# Patient Record
Sex: Female | Born: 1965 | Race: White | Hispanic: No | Marital: Married | Smoking: Current every day smoker
Health system: Southern US, Community
[De-identification: ages and names within clinical notes are randomized; demographics above are authoritative.]

## PROBLEM LIST (undated history)

## (undated) DIAGNOSIS — E663 Overweight: Secondary | ICD-10-CM

## (undated) DIAGNOSIS — N959 Unspecified menopausal and perimenopausal disorder: Secondary | ICD-10-CM

## (undated) DIAGNOSIS — H9192 Unspecified hearing loss, left ear: Secondary | ICD-10-CM

## (undated) DIAGNOSIS — F17201 Nicotine dependence, unspecified, in remission: Secondary | ICD-10-CM

## (undated) DIAGNOSIS — K5730 Diverticulosis of large intestine without perforation or abscess without bleeding: Secondary | ICD-10-CM

## (undated) DIAGNOSIS — F17200 Nicotine dependence, unspecified, uncomplicated: Secondary | ICD-10-CM

## (undated) DIAGNOSIS — I10 Essential (primary) hypertension: Secondary | ICD-10-CM

## (undated) DIAGNOSIS — N921 Excessive and frequent menstruation with irregular cycle: Secondary | ICD-10-CM

## (undated) DIAGNOSIS — I700 Atherosclerosis of aorta: Secondary | ICD-10-CM

## (undated) DIAGNOSIS — F329 Major depressive disorder, single episode, unspecified: Secondary | ICD-10-CM

## (undated) DIAGNOSIS — J42 Unspecified chronic bronchitis: Secondary | ICD-10-CM

## (undated) DIAGNOSIS — F419 Anxiety disorder, unspecified: Secondary | ICD-10-CM

## (undated) HISTORY — DX: Overweight: E66.3

## (undated) HISTORY — DX: Diverticulosis of large intestine without perforation or abscess without bleeding: K57.30

## (undated) HISTORY — DX: Atherosclerosis of aorta: I70.0

## (undated) HISTORY — DX: Unspecified chronic bronchitis: J42

## (undated) HISTORY — DX: Essential (primary) hypertension: I10

## (undated) HISTORY — DX: Excessive and frequent menstruation with irregular cycle: N92.1

## (undated) HISTORY — PX: OTHER SURGICAL HISTORY: SHX169

## (undated) HISTORY — DX: Nicotine dependence, unspecified, in remission: F17.201

## (undated) HISTORY — DX: Nicotine dependence, unspecified, uncomplicated: F17.200

## (undated) HISTORY — DX: Anxiety disorder, unspecified: F41.9

## (undated) HISTORY — DX: Unspecified hearing loss, left ear: H91.92

## (undated) HISTORY — DX: Unspecified menopausal and perimenopausal disorder: N95.9

## (undated) HISTORY — DX: Major depressive disorder, single episode, unspecified: F32.9

---

## 1999-04-17 ENCOUNTER — Other Ambulatory Visit: Admission: RE | Admit: 1999-04-17 | Discharge: 1999-04-17 | Payer: Self-pay | Admitting: Family Medicine

## 2002-06-09 ENCOUNTER — Other Ambulatory Visit: Admission: RE | Admit: 2002-06-09 | Discharge: 2002-06-09 | Payer: Self-pay | Admitting: Family Medicine

## 2005-01-30 ENCOUNTER — Ambulatory Visit: Payer: Self-pay | Admitting: Internal Medicine

## 2005-02-13 ENCOUNTER — Ambulatory Visit: Payer: Self-pay | Admitting: Internal Medicine

## 2005-05-07 ENCOUNTER — Ambulatory Visit: Payer: Self-pay | Admitting: Internal Medicine

## 2007-02-15 ENCOUNTER — Telehealth (INDEPENDENT_AMBULATORY_CARE_PROVIDER_SITE_OTHER): Payer: Self-pay | Admitting: *Deleted

## 2009-04-10 ENCOUNTER — Encounter: Admission: RE | Admit: 2009-04-10 | Discharge: 2009-04-10 | Payer: Self-pay | Admitting: Obstetrics and Gynecology

## 2011-01-21 DIAGNOSIS — N959 Unspecified menopausal and perimenopausal disorder: Secondary | ICD-10-CM

## 2011-01-21 HISTORY — DX: Unspecified menopausal and perimenopausal disorder: N95.9

## 2011-07-14 ENCOUNTER — Ambulatory Visit (INDEPENDENT_AMBULATORY_CARE_PROVIDER_SITE_OTHER): Payer: BC Managed Care – PPO | Admitting: Family Medicine

## 2011-07-14 VITALS — BP 156/99 | HR 80 | Temp 98.4°F | Resp 18 | Ht 64.0 in | Wt 166.2 lb

## 2011-07-14 DIAGNOSIS — R350 Frequency of micturition: Secondary | ICD-10-CM

## 2011-07-14 LAB — POCT URINALYSIS DIPSTICK
Bilirubin, UA: NEGATIVE
Glucose, UA: NEGATIVE
Spec Grav, UA: <=1.005
pH, UA: 6

## 2011-07-14 LAB — POCT UA - MICROSCOPIC ONLY
Bacteria, U Microscopic: NEGATIVE
Casts, Ur, LPF, POC: NEGATIVE
Crystals, Ur, HPF, POC: NEGATIVE
Mucus, UA: NEGATIVE

## 2011-07-14 MED ORDER — CIPROFLOXACIN HCL 500 MG PO TABS: 500.0000 mg | ORAL_TABLET | Freq: Two times a day (BID) | ORAL | Status: AC

## 2011-07-14 NOTE — Progress Notes (Signed)
   46 yo woman with urgency and terminal voiding bladder discomfort.  No back pain, fever.  LMP last week.  She [facility] 3 days of [facility]  These are first UTI's in years.  O:  NAD Results for orders placed in visit on 07/14/11  POCT URINALYSIS DIPSTICK      Component Value Range   Color, UA yellow     Clarity, UA clear     Glucose, UA negative     Bilirubin, UA negative     Ketones, UA negative     Spec Grav, UA <=1.005     Blood, UA trace-lysed     pH, UA 6.0     Protein, UA negative     Urobilinogen, UA 0.2     Nitrite, UA negative     Leukocytes, UA Negative    POCT UA - MICROSCOPIC ONLY      Component Value Range   WBC, Ur, HPF, POC 0-1     RBC, urine, microscopic negative     Bacteria, U Microscopic negative     Mucus, UA negative     Epithelial cells, urine per micros 0-1     Crystals, Ur, HPF, POC negative     Casts, Ur, LPF, POC negative     Yeast, UA negative        A:  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 Since patient is leaving town on fishing trip elected to restart the Cipro and wait for the urine culture come back.  P:  Urine culture  Cipro 500 mg bid x 7 days

## 2011-07-16 LAB — URINE CULTURE
Colony Count: NO GROWTH
Organism ID, Bacteria: NO GROWTH

## 2011-11-11 ENCOUNTER — Encounter: Payer: Self-pay | Admitting: Family Medicine

## 2011-11-11 ENCOUNTER — Ambulatory Visit (INDEPENDENT_AMBULATORY_CARE_PROVIDER_SITE_OTHER): Payer: BC Managed Care – PPO | Admitting: Family Medicine

## 2011-11-11 VITALS — BP 161/95 | HR 84 | Temp 98.0°F | Ht 64.0 in | Wt 169.8 lb

## 2011-11-11 DIAGNOSIS — Z720 Tobacco use: Secondary | ICD-10-CM | POA: Insufficient documentation

## 2011-11-11 DIAGNOSIS — N926 Irregular menstruation, unspecified: Secondary | ICD-10-CM

## 2011-11-11 DIAGNOSIS — F32A Anxiety disorder, unspecified: Secondary | ICD-10-CM

## 2011-11-11 DIAGNOSIS — F329 Major depressive disorder, single episode, unspecified: Secondary | ICD-10-CM | POA: Insufficient documentation

## 2011-11-11 DIAGNOSIS — F17200 Nicotine dependence, unspecified, uncomplicated: Secondary | ICD-10-CM

## 2011-11-11 DIAGNOSIS — Z0000 Encounter for general adult medical examination without abnormal findings: Secondary | ICD-10-CM | POA: Insufficient documentation

## 2011-11-11 DIAGNOSIS — F341 Dysthymic disorder: Secondary | ICD-10-CM

## 2011-11-11 DIAGNOSIS — F419 Anxiety disorder, unspecified: Secondary | ICD-10-CM | POA: Insufficient documentation

## 2011-11-11 DIAGNOSIS — E663 Overweight: Secondary | ICD-10-CM | POA: Insufficient documentation

## 2011-11-11 DIAGNOSIS — I10 Essential (primary) hypertension: Secondary | ICD-10-CM | POA: Insufficient documentation

## 2011-11-11 DIAGNOSIS — N921 Excessive and frequent menstruation with irregular cycle: Secondary | ICD-10-CM

## 2011-11-11 HISTORY — DX: Excessive and frequent menstruation with irregular cycle: N92.1

## 2011-11-11 HISTORY — DX: Overweight: E66.3

## 2011-11-11 HISTORY — DX: Anxiety disorder, unspecified: F41.9

## 2011-11-11 HISTORY — DX: Anxiety disorder, unspecified: F32.A

## 2011-11-11 MED ORDER — BUPROPION HCL ER (XL) 150 MG PO TB24: 150.0000 mg | ORAL_TABLET | Freq: Every day | ORAL | Status: DC

## 2011-11-11 MED ORDER — LISINOPRIL 10 MG PO TABS: 10.0000 mg | ORAL_TABLET | Freq: Two times a day (BID) | ORAL | Status: DC

## 2011-11-11 NOTE — Assessment & Plan Note (Signed)
Just started on Wellbutrin XL 150 mg po daily 3 weeks ago and definitely feels it is helping. Given a refill today and she may have trouble with insurance in which case we can switch her to bupropion 75 mg short acting 1 tab twice daily

## 2011-11-11 NOTE — Patient Instructions (Addendum)
Consider Digestive Health probiotics by Schiff, couponse on website  Care for Adults, Female A healthy lifestyle and preventive care can promote health and wellness. Preventive health guidelines for women include the following key practices.  A routine yearly physical is a good way to check with your caregiver about your health and preventive screening. It is a chance to share any concerns and updates on your health, and to receive a thorough exam.  Visit your dentist for a routine exam and preventive care every 6 months. Brush your teeth twice a day and floss once a day. Good oral hygiene prevents tooth decay and gum disease.  The frequency of eye exams is based on your age, health, family medical history, use of contact lenses, and other factors. Follow your caregiver's recommendations for frequency of eye exams.  Eat a healthy diet. Foods like vegetables, fruits, whole grains, low-fat dairy products, and lean protein foods contain the nutrients you need without too many calories. Decrease your intake of foods high in solid fats, added sugars, and salt. Eat the right amount of calories for you.Get information about a proper diet from your caregiver, if necessary.  Regular physical exercise is one of the most important things you can do for your health. Most adults should get at least 150 minutes of moderate-intensity exercise (any activity that increases your heart rate and causes you to sweat) each week. In addition, most adults need muscle-strengthening exercises on 2 or more days a week.  Maintain a healthy weight. The body mass index (BMI) is a screening tool to identify possible weight problems. It provides an estimate of body fat based on height and weight. Your caregiver can help determine your BMI, and can help you achieve or maintain a healthy weight.For adults 20 years and older:  A BMI below 18.5 is considered underweight.  A BMI of 18.5 to 24.9 is normal.  A BMI of 25 to 29.9 is  considered overweight.  A BMI of 30 and above is considered obese.  Maintain normal blood lipids and cholesterol levels by exercising and minimizing your intake of saturated fat. Eat a balanced diet with plenty of fruit and vegetables. Blood tests for lipids and cholesterol should begin at age 96 and be repeated every 5 years. If your lipid or cholesterol levels are high, you are over 50, or you are at high risk for heart disease, you may need your cholesterol levels checked more frequently.Ongoing high lipid and cholesterol levels should be treated with medicines if diet and exercise are not effective.  If you smoke, find out from your caregiver how to quit. If you do not use tobacco, do not start.  If you are pregnant, do not drink alcohol. If you are breastfeeding, be very cautious about drinking alcohol. If you are not pregnant and choose to drink alcohol, do not exceed 1 drink per day. One drink is considered to be 12 ounces (355 mL) of beer, 5 ounces (148 mL) of wine, or 1.5 ounces (44 mL) of liquor.  Avoid use of street drugs. Do not share needles with anyone. Ask for help if you need support or instructions about stopping the use of drugs.  High blood pressure causes heart disease and increases the risk of stroke. Your blood pressure should be checked at least every 1 to 2 years. Ongoing high blood pressure should be treated with medicines if weight loss and exercise are not effective.  If you are 27 to 47 years old, ask your caregiver if  you should take aspirin to prevent strokes.  Diabetes screening involves taking a blood sample to check your fasting blood sugar level. This should be done once every 3 years, after age 44, if you are within normal weight and without risk factors for diabetes. Testing should be considered at a younger age or be carried out more frequently if you are overweight and have at least 1 risk factor for diabetes.  Breast cancer screening is essential preventive  care for women. You should practice "breast self-awareness." This means understanding the normal appearance and feel of your breasts and may include breast self-examination. Any changes detected, no matter how small, should be reported to a caregiver. Women in their 23s and 30s should have a clinical breast exam (CBE) by a caregiver as part of a regular health exam every 1 to 3 years. After age 66, women should have a CBE every year. Starting at age 26, women should consider having a mammography (breast X-ray test) every year. Women who have a family history of breast cancer should talk to their caregiver about genetic screening. Women at a high risk of breast cancer should talk to their caregivers about having magnetic resonance imaging (MRI) and a mammography every year.  The Pap test is a screening test for cervical cancer. A Pap test can show cell changes on the cervix that might become cervical cancer if left untreated. A Pap test is a procedure in which cells are obtained and examined from the lower end of the uterus (cervix).  Women should have a Pap test starting at age 19.  Between ages 24 and 67, Pap tests should be repeated every 2 years.  Beginning at age 46, you should have a Pap test every 3 years as long as the past 3 Pap tests have been normal.  Some women have medical problems that increase the chance of getting cervical cancer. Talk to your caregiver about these problems. It is especially important to talk to your caregiver if a new problem develops soon after your last Pap test. In these cases, your caregiver may recommend more frequent screening and Pap tests.  The above recommendations are the same for women who have or have not gotten the vaccine for human papillomavirus (HPV).  If you had a hysterectomy for a problem that was not cancer or a condition that could lead to cancer, then you no longer need Pap tests. Even if you no longer need a Pap test, a regular exam is a good idea  to make sure no other problems are starting.  If you are between ages 26 and 29, and you have had normal Pap tests going back 10 years, you no longer need Pap tests. Even if you no longer need a Pap test, a regular exam is a good idea to make sure no other problems are starting.  If you have had past treatment for cervical cancer or a condition that could lead to cancer, you need Pap tests and screening for cancer for at least 20 years after your treatment.  If Pap tests have been discontinued, risk factors (such as a new sexual partner) need to be reassessed to determine if screening should be resumed.  The HPV test is an additional test that may be used for cervical cancer screening. The HPV test looks for the virus that can cause the cell changes on the cervix. The cells collected during the Pap test can be tested for HPV. The HPV test could be used to  screen women aged 73 years and older, and should be used in women of any age who have unclear Pap test results. After the age of 29, women should have HPV testing at the same frequency as a Pap test.  Colorectal cancer can be detected and often prevented. Most routine colorectal cancer screening begins at the age of 64 and continues through age 2. However, your caregiver may recommend screening at an earlier age if you have risk factors for colon cancer. On a yearly basis, your caregiver may provide home test kits to check for hidden blood in the stool. Use of a small camera at the end of a tube, to directly examine the colon (sigmoidoscopy or colonoscopy), can detect the earliest forms of colorectal cancer. Talk to your caregiver about this at age 76, when routine screening begins. Direct examination of the colon should be repeated every 5 to 10 years through age 26, unless early forms of pre-cancerous polyps or small growths are found.  Hepatitis C blood testing is recommended for all people born from [birth year] through 1965 and any individual with known  risks for hepatitis C.  Practice safe sex. Use condoms and avoid high-risk sexual practices to reduce the spread of sexually transmitted infections (STIs). STIs include gonorrhea, chlamydia, syphilis, trichomonas, herpes, HPV, and human immunodeficiency virus (HIV). Herpes, HIV, and HPV are viral illnesses that have no cure. They can result in disability, cancer, and death. Sexually active women aged 31 and younger should be checked for chlamydia. Older women with new or multiple partners should also be tested for chlamydia. Testing for other STIs is recommended if you are sexually active and at increased risk.  Osteoporosis is a disease in which the bones lose minerals and strength with aging. This can result in serious bone fractures. The risk of osteoporosis can be identified using a bone density scan. Women ages 60 and over and women at risk for fractures or osteoporosis should discuss screening with their caregivers. Ask your caregiver whether you should take a calcium supplement or vitamin D to reduce the rate of osteoporosis.  Menopause can be associated with physical symptoms and risks. Hormone replacement therapy is available to decrease symptoms and risks. You should talk to your caregiver about whether hormone replacement therapy is right for you.  Use sunscreen with sun protection factor (SPF) of 30 or more. Apply sunscreen liberally and repeatedly throughout the day. You should seek shade when your shadow is shorter than you. Protect yourself by wearing long sleeves, pants, a wide-brimmed hat, and sunglasses year round, whenever you are outdoors.  Once a month, do a whole body skin exam, using a mirror to look at the skin on your back. Notify your caregiver of new moles, moles that have irregular borders, moles that are larger than a pencil eraser, or moles that have changed in shape or color.  Stay current with required immunizations.  Influenza. You need a dose every fall (or winter).  The composition of the flu vaccine changes each year, so being vaccinated once is not enough.  Pneumococcal polysaccharide. You need 1 to 2 doses if you smoke cigarettes or if you have certain chronic medical conditions. You need 1 dose at age 68 (or older) if you have never been vaccinated.  Tetanus, diphtheria, pertussis (Tdap, Td). Get 1 dose of Tdap vaccine if you are younger than age 70, are over 64 and have contact with an infant, are a Research scientist (physical sciences), are pregnant, or simply want to be protected  from whooping cough. After that, you need a Td booster dose every 10 years. Consult your caregiver if you have not had at least 3 tetanus and diphtheria-containing shots sometime in your life or have a deep or dirty wound.  HPV. You need this vaccine if you are a woman age 92 or younger. The vaccine is given in 3 doses over 6 months.  Measles, mumps, rubella (MMR). You need at least 1 dose of MMR if you were born in 1957 or later. You may also need a second dose.  Meningococcal. If you are age 1 to 54 and a first-year college student living in a residence hall, or have one of several medical conditions, you need to get vaccinated against meningococcal disease. You may also need additional booster doses.  Zoster (shingles). If you are age 88 or older, you should get this vaccine.  Varicella (chickenpox). If you have never had chickenpox or you were vaccinated but received only 1 dose, talk to your caregiver to find out if you need this vaccine.  Hepatitis A. You need this vaccine if you have a specific risk factor for hepatitis A virus infection or you simply wish to be protected from this disease. The vaccine is usually given as 2 doses, 6 to 18 months apart.  Hepatitis B. You need this vaccine if you have a specific risk factor for hepatitis B virus infection or you simply wish to be protected from this disease. The vaccine is given in 3 doses, usually over 6 months. Preventive Services /  Frequency Ages 25 to 1  Blood pressure check.** / Every 1 to 2 years.  Lipid and cholesterol check.** / Every 5 years beginning at age 67.  Clinical breast exam.** / Every 3 years for women in their 24s and 30s.  Pap test.** / Every 2 years from ages 79 through 27. Every 3 years starting at age 3 through age 58 or 49 with a history of 3 consecutive normal Pap tests.  HPV screening.** / Every 3 years from ages 63 through ages 50 to 59 with a history of 3 consecutive normal Pap tests.  Hepatitis C blood test.** / For any individual with known risks for hepatitis C.  Skin self-exam. / Monthly.  Influenza immunization.** / Every year.  Pneumococcal polysaccharide immunization.** / 1 to 2 doses if you smoke cigarettes or if you have certain chronic medical conditions.  Tetanus, diphtheria, pertussis (Tdap, Td) immunization. / A one-time dose of Tdap vaccine. After that, you need a Td booster dose every 10 years.  HPV immunization. / 3 doses over 6 months, if you are 100 and younger.  Measles, mumps, rubella (MMR) immunization. / You need at least 1 dose of MMR if you were born in 1957 or later. You may also need a second dose.  Meningococcal immunization. / 1 dose if you are age 49 to 48 and a first-year college student living in a residence hall, or have one of several medical conditions, you need to get vaccinated against meningococcal disease. You may also need additional booster doses.  Varicella immunization.** / Consult your caregiver.  Hepatitis A immunization.** / Consult your caregiver. 2 doses, 6 to 18 months apart.  Hepatitis B immunization.** / Consult your caregiver. 3 doses usually over 6 months. Ages 48 to 11  Blood pressure check.** / Every 1 to 2 years.  Lipid and cholesterol check.** / Every 5 years beginning at age 90.  Clinical breast exam.** / Every year after age 60.  Mammogram.** / Every year beginning at age 56 and continuing for as long as you are in  good health. Consult with your caregiver.  Pap test.** / Every 3 years starting at age 81 through age 71 or 8 with a history of 3 consecutive normal Pap tests.  HPV screening.** / Every 3 years from ages 70 through ages 17 to 80 with a history of 3 consecutive normal Pap tests.  Fecal occult blood test (FOBT) of stool. / Every year beginning at age 80 and continuing until age 41. You may not need to do this test if you get a colonoscopy every 10 years.  Flexible sigmoidoscopy or colonoscopy.** / Every 5 years for a flexible sigmoidoscopy or every 10 years for a colonoscopy beginning at age 54 and continuing until age 48.  Hepatitis C blood test.** / For all people born from [birth year] through 1966 and any individual with known risks for hepatitis C.  Skin self-exam. / Monthly.  Influenza immunization.** / Every year.  Pneumococcal polysaccharide immunization.** / 1 to 2 doses if you smoke cigarettes or if you have certain chronic medical conditions.  Tetanus, diphtheria, pertussis (Tdap, Td) immunization.** / A one-time dose of Tdap vaccine. After that, you need a Td booster dose every 10 years.  Measles, mumps, rubella (MMR) immunization. / You need at least 1 dose of MMR if you were born in 1957 or later. You may also need a second dose.  Varicella immunization.** / Consult your caregiver.  Meningococcal immunization.** / Consult your caregiver.  Hepatitis A immunization.** / Consult your caregiver. 2 doses, 6 to 18 months apart.  Hepatitis B immunization.** / Consult your caregiver. 3 doses, usually over 6 months. Ages 2 and over  Blood pressure check.** / Every 1 to 2 years.  Lipid and cholesterol check.** / Every 5 years beginning at age 97.  Clinical breast exam.** / Every year after age 3.  Mammogram.** / Every year beginning at age 22 and continuing for as long as you are in good health. Consult with your caregiver.  Pap test.** / Every 3 years starting at age 45 through  age 30 or 47 with a 3 consecutive normal Pap tests. Testing can be stopped between 65 and 70 with 3 consecutive normal Pap tests and no abnormal Pap or HPV tests in the past 10 years.  HPV screening.** / Every 3 years from ages 44 through ages 40 or 79 with a history of 3 consecutive normal Pap tests. Testing can be stopped between 65 and 70 with 3 consecutive normal Pap tests and no abnormal Pap or HPV tests in the past 10 years.  Fecal occult blood test (FOBT) of stool. / Every year beginning at age 4 and continuing until age 41. You may not need to do this test if you get a colonoscopy every 10 years.  Flexible sigmoidoscopy or colonoscopy.** / Every 5 years for a flexible sigmoidoscopy or every 10 years for a colonoscopy beginning at age 49 and continuing until age 26.  Hepatitis C blood test.** / For all people born from [birth year] through 1966 and any individual with known risks for hepatitis C.  Osteoporosis screening.** / A one-time screening for women ages 84 and over and women at risk for fractures or osteoporosis.  Skin self-exam. / Monthly.  Influenza immunization.** / Every year.  Pneumococcal polysaccharide immunization.** / 1 dose at age 75 (or older) if you have never been vaccinated.  Tetanus, diphtheria, pertussis (Tdap, Td) immunization. / A one-time  dose of Tdap vaccine if you are over 65 and have contact with an infant, are a Research scientist (physical sciences), or simply want to be protected from whooping cough. After that, you need a Td booster dose every 10 years.  Varicella immunization.** / Consult your caregiver.  Meningococcal immunization.** / Consult your caregiver.  Hepatitis A immunization.** / Consult your caregiver. 2 doses, 6 to 18 months apart.  Hepatitis B immunization.** / Check with your caregiver. 3 doses, usually over 6 months. ** Family history and personal history of risk and conditions may change your caregiver's recommendations. Document Released: 03/04/2001 Document  Revised: 03/31/2011 Document Reviewed: 06/03/2010 [facility] Patient Information 2013 [place], [place].

## 2011-11-11 NOTE — Assessment & Plan Note (Signed)
Just started on lisinopril 10 mg daily a couple weeks ago. Will increase her to 10 mg twice a day for now recheck her blood pressure in 1 month's time.

## 2011-11-11 NOTE — Assessment & Plan Note (Signed)
TSH, CBC, Lipid panels all reviewed and normal. Encouraged avoidance of trans fats, increased exercise, decreased po intake, minimal simple carbs and saturated fats.

## 2011-11-11 NOTE — Assessment & Plan Note (Addendum)
Patient encouraged to attempt complete cessation. She may try an ecig if she needs to

## 2011-11-11 NOTE — Assessment & Plan Note (Signed)
Patient has noted in last 6 months. Not very irregular. She would have very heavy period every 2 weeks for 8 weeks and then went 8 weeks without a cycle. She has discussed this with her GYN office and encouraged her to consider an ultrasound before she is declined. She's encouraged to proceed with this secondary to her anxiety about the situation

## 2011-11-11 NOTE — Assessment & Plan Note (Signed)
TSH, CBC and Lipid done recently all reviewed with patient and normal, check renal and hepatic today. Return in 3 months after old records arrive or as needed

## 2011-11-11 NOTE — Progress Notes (Signed)
Patient ID: Kristin Day, female   DOB: 08-25-1965, 46 y.o.   MRN: [id number] Kristin Day [id number] 30-Jul-1965 11/11/2011      Progress Note New Patient  Subjective  Chief Complaint  Chief Complaint  Patient presents with  . Establish Care    new patient    HPI  Patient is a 46 year old Caucasian female who is here today to establish care. She previously has been seen by GYN but her blood pressures been trending up for the past year. She reports typically see numbers in the 140s over 90s but more recently his see numbers in the 160s over 90s. She was encouraged to by her gynecologist's office to establish care. Started on lisinopril 10 mg daily which she has tolerated. 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 She was started on Wellbutrin 150 mg XL daily 3 responding to helping. Unfortunately she continues to smoke about half pack per day. She did have a UTI about a month ago but that is not a recurrent problem for her. She feels better at this time. Denies fevers, chills, congestion, chest pain, palpitations, shortness of breath, GI or GU complaints at today's visit.  Past Medical History  Diagnosis Date  . Chicken pox   . Hypertension   . Anxiety   . Overweight 11/11/2011  . Preventative health care 11/11/2011  . Anxiety and depression 11/11/2011    Past Surgical History  Procedure Date  . Cesarean section 1991    Family History  Problem Relation Age of Onset  . COPD Mother     smoker  . Emphysema Mother   . Hypertension Father   . Cancer Father     skin/BCC and prostate  . Cancer Maternal Grandmother     ovarian  . Emphysema Maternal Grandfather     ?  Marland Kitchen Heart disease Paternal Grandmother   . Heart disease Paternal Grandfather   . Hypertension Paternal Grandfather   . Diabetes Paternal Grandfather     History   Social History  . Marital Status: Married    Spouse Name:  N/A    Number of Children: N/A  . Years of Education: N/A   Occupational History  . Not on file.   Social History Main Topics  . Smoking status: Current Every Day Smoker -- 0.5 packs/day for 30 years    Types: Cigarettes  . Smokeless tobacco: Never Used  . Alcohol Use: Yes     6-12 beers weekly  . Drug Use: No  . Sexually Active: Yes -- Female partner(s)   Other Topics Concern  . Not on file   Social History Narrative  . No narrative on file    Current Outpatient Prescriptions on File Prior to Visit  Medication Sig Dispense Refill  . buPROPion (WELLBUTRIN XL) 150 MG 24 hr tablet Take 1 tablet (150 mg total) by mouth daily.  30 tablet  3  . lisinopril (PRINIVIL,ZESTRIL) 10 MG tablet Take 1 tablet (10 mg total) by mouth 2 (two) times daily.  60 tablet  2    No Known Allergies  Review of Systems  Review of Systems  Constitutional: Negative for fever, chills and malaise/fatigue.  HENT: Negative for hearing loss, nosebleeds and congestion.   Eyes: Negative for discharge.  Respiratory: Negative for cough, sputum production, shortness of breath and wheezing.   Cardiovascular: Negative for chest pain, palpitations and leg swelling.  Gastrointestinal: Negative for  heartburn, nausea, vomiting, abdominal pain, diarrhea, constipation and blood in stool.  Genitourinary: Negative for dysuria, urgency, frequency and hematuria.  Musculoskeletal: Negative for myalgias, back pain and falls.  Skin: Negative for rash.  Neurological: Negative for dizziness, tremors, sensory change, focal weakness, loss of consciousness, weakness and headaches.  Endo/Heme/Allergies: Negative for polydipsia. Does not bruise/bleed easily.  Psychiatric/Behavioral: Negative for depression and suicidal ideas. The patient is nervous/anxious. The patient does not have insomnia.     Objective  BP 161/95  Pulse 84  Temp 98 F (36.7 C) (Temporal)  Ht 5\' 4"  (1.626 m)  Wt 169 lb 12.8 oz (77.021 kg)  BMI 29.15  kg/m2  SpO2 96%  LMP 11/07/2011  Physical Exam  Physical Exam  Constitutional: She is oriented to person, place, and time and well-developed, well-nourished, and in no distress. No distress.  HENT:  Head: Normocephalic and atraumatic.  Right Ear: External ear normal.  Left Ear: External ear normal.  Nose: Nose normal.  Mouth/Throat: Oropharynx is clear and moist. No oropharyngeal exudate.  Eyes: Conjunctivae normal are normal. Pupils are equal, round, and reactive to light. Right eye exhibits no discharge. Left eye exhibits no discharge. No scleral icterus.  Neck: Normal range of motion. Neck supple. No thyromegaly present.  Cardiovascular: Normal rate, regular rhythm, normal heart sounds and intact distal pulses.   No murmur heard. Pulmonary/Chest: Effort normal and breath sounds normal. No respiratory distress. She has no wheezes. She has no rales.  Abdominal: Soft. Bowel sounds are normal. She exhibits no distension and no mass. There is no tenderness.  Musculoskeletal: Normal range of motion. She exhibits no edema and no tenderness.  Lymphadenopathy:    She has no cervical adenopathy.  Neurological: She is alert and oriented to person, place, and time. She has normal reflexes. No cranial nerve deficit. Coordination normal.  Skin: Skin is warm and dry. No rash noted. She is not diaphoretic.  Psychiatric: Mood, memory and affect normal.       Assessment & Plan  Tobacco abuse Patient encouraged to attempt complete cessation. She may try an ecig if she needs to  Anxiety and depression Just started on Wellbutrin XL 150 mg po daily 3 weeks ago and definitely feels it is helping. Given a refill today and she may have trouble with insurance in which case we can switch her to bupropion 75 mg short acting 1 tab twice daily  Hypertension Just started on lisinopril 10 mg daily a couple weeks ago. Will increase her to 10 mg twice a day for now recheck her blood pressure in 1 month's  time.  Overweight TSH, CBC, Lipid panels all reviewed and normal. Encouraged avoidance of trans fats, increased exercise, decreased po intake, minimal simple carbs and saturated fats.   Preventative health care TSH, CBC and Lipid done recently all reviewed with patient and normal, check renal and hepatic today. Return in 3 months after old records arrive or as needed  Irregular menses Patient has noted in last 6 months. Not very irregular. She would have very heavy period every 2 weeks for 8 weeks and then went 8 weeks without a cycle. She has discussed this with her GYN office and encouraged her to consider an ultrasound before she is declined. She's encouraged to proceed with this secondary to her anxiety about the situation

## 2011-11-12 LAB — RENAL FUNCTION PANEL
Albumin: 3.6 g/dL (ref 3.5–5.2)
BUN: 10 mg/dL (ref 6–23)
CO2: 26 mEq/L (ref 19–32)
Chloride: 106 mEq/L (ref 96–112)

## 2011-11-12 LAB — HEPATIC FUNCTION PANEL
AST: 17 U/L (ref 0–37)
Total Protein: 6.9 g/dL (ref 6.0–8.3)

## 2011-12-12 ENCOUNTER — Encounter: Payer: Self-pay | Admitting: Family Medicine

## 2011-12-12 ENCOUNTER — Ambulatory Visit (INDEPENDENT_AMBULATORY_CARE_PROVIDER_SITE_OTHER): Payer: BC Managed Care – PPO | Admitting: Family Medicine

## 2011-12-12 VITALS — BP 171/112 | HR 71

## 2011-12-12 DIAGNOSIS — I10 Essential (primary) hypertension: Secondary | ICD-10-CM

## 2011-12-12 MED ORDER — LISINOPRIL 40 MG PO TABS: 40.0000 mg | ORAL_TABLET | Freq: Every day | ORAL | Status: DC

## 2011-12-12 NOTE — Progress Notes (Signed)
46 y/o WF here for nurse visit for bp check.   BP elevated in stage 2 range still while taking lisinopril 10mg  twice a day. HR in the 70s.  Pt appears well today. Discussed with pt. Decided on dose increase to 40mg  lisinopril once daily. Also encouraged pt to buy bp cuff for home bp monitoring. She agreed to changes and will keep scheduled f/u with Dr. Abner Greenspan.

## 2011-12-12 NOTE — Progress Notes (Signed)
Pt presents for blood pressure check.   Check at 2:20 pm was 171/112, P 71 automatic Pt states she had 2 cigarettes on the way to our office and left a manager's meeting that was not going well.  2nd check at 2:40 pm 152/98, P 86 Manual  Please advise any changes.

## 2011-12-12 NOTE — Assessment & Plan Note (Signed)
Poor control. Increase lisinopril to 40mg  qd. She has f/u set with Dr. Abner Greenspan soon.

## 2012-01-27 ENCOUNTER — Ambulatory Visit (INDEPENDENT_AMBULATORY_CARE_PROVIDER_SITE_OTHER): Payer: BC Managed Care – PPO | Admitting: Family Medicine

## 2012-01-27 ENCOUNTER — Encounter: Payer: Self-pay | Admitting: Family Medicine

## 2012-01-27 VITALS — BP 138/88 | HR 94 | Temp 98.4°F | Ht 64.0 in | Wt 167.0 lb

## 2012-01-27 DIAGNOSIS — F17200 Nicotine dependence, unspecified, uncomplicated: Secondary | ICD-10-CM

## 2012-01-27 DIAGNOSIS — F329 Major depressive disorder, single episode, unspecified: Secondary | ICD-10-CM

## 2012-01-27 DIAGNOSIS — I10 Essential (primary) hypertension: Secondary | ICD-10-CM

## 2012-01-27 DIAGNOSIS — J209 Acute bronchitis, unspecified: Secondary | ICD-10-CM | POA: Insufficient documentation

## 2012-01-27 DIAGNOSIS — Z720 Tobacco use: Secondary | ICD-10-CM

## 2012-01-27 DIAGNOSIS — J40 Bronchitis, not specified as acute or chronic: Secondary | ICD-10-CM

## 2012-01-27 DIAGNOSIS — F341 Dysthymic disorder: Secondary | ICD-10-CM

## 2012-01-27 DIAGNOSIS — F32A Depression, unspecified: Secondary | ICD-10-CM

## 2012-01-27 MED ORDER — AZITHROMYCIN 250 MG PO TABS: ORAL_TABLET | ORAL | Status: DC

## 2012-01-27 MED ORDER — HYDROCOD POLST-CHLORPHEN POLST 10-8 MG/5ML PO LQCR: 5.0000 mL | Freq: Every evening | ORAL | Status: DC | PRN

## 2012-01-27 MED ORDER — METHYLPREDNISOLONE 4 MG PO KIT: PACK | ORAL | Status: DC

## 2012-01-27 NOTE — Assessment & Plan Note (Signed)
Improved with increased Lisinopril despite acute illness. Continue same

## 2012-01-27 NOTE — Assessment & Plan Note (Signed)
Encouraged increased rest, hydration and mucinex bid, Started on a Zpak and Medrol dosepak

## 2012-01-27 NOTE — Assessment & Plan Note (Signed)
Has not smoked a cigarette for days secondary to acute illness, encouraged to try not to go back when she is feeling better.

## 2012-01-27 NOTE — Assessment & Plan Note (Signed)
Tolerating Wellbutrin XL 150 mg daily. Declines further increase will discuss at next visit

## 2012-01-27 NOTE — Patient Instructions (Addendum)

## 2012-01-27 NOTE — Progress Notes (Signed)
Patient ID: Kristin Day, female   DOB: 1965/07/01, 47 y.o.   MRN: [id number] Kristin Day [id number] 1965-07-08 01/27/2012      Progress Note-Follow Up  Subjective  Chief Complaint  Chief Complaint  Patient presents with  . Fever    X 3 days  . Headache    X 3 days  . Cough    dry X 3 days  . Fatigue    X 3 days    HPI  Patient is a 47 year old Caucasian female in she was roughly 3 days with worsening symptoms. Started with fevers and malaise. Over the last year she developed significant mostly dry cough which is now keeping her up at night. She's had a temperature off-and-on to a maximum of 2.9. Is now taking Advil every 4 hours secondary to some chest wall pain worsens when she coughs but also low-grade fevers. She denies ear pain, throat pain, sinus pressure. Denies shortness or breath or wheezing but her cough is almost debilitating. Mild nasal congestion but no rhinorrhea. Denies any GI or GU complaints. No diarrhea, nausea vomiting, urinary frequency urgency or dysuria are noted. No palpitations.  Past Medical History  Diagnosis Date  . Chicken pox   . Hypertension   . Anxiety   . Overweight 11/11/2011  . Preventative health care 11/11/2011  . Anxiety and depression 11/11/2011  . Irregular menses 11/11/2011  . Acute bronchitis 01/27/2012    Past Surgical History  Procedure Date  . Cesarean section 1991    Family History  Problem Relation Age of Onset  . COPD Mother     smoker  . Emphysema Mother   . Hypertension Father   . Cancer Father     skin/BCC and prostate  . Cancer Maternal Grandmother     ovarian  . Emphysema Maternal Grandfather     ?  Marland Kitchen Heart disease Paternal Grandmother   . Heart disease Paternal Grandfather   . Hypertension Paternal Grandfather   . Diabetes Paternal Grandfather     History   Social History  . Marital Status: Married    Spouse Name: N/A    Number of Children: N/A  . Years of Education: N/A   Occupational History  .  Not on file.   Social History Main Topics  . Smoking status: Current Every Day Smoker -- 0.5 packs/day for 30 years    Types: Cigarettes  . Smokeless tobacco: Never Used  . Alcohol Use: Yes     Comment: 6-12 beers weekly  . Drug Use: No  . Sexually Active: Yes -- Female partner(s)   Other Topics Concern  . Not on file   Social History Narrative  . No narrative on file    Current Outpatient Prescriptions on File Prior to Visit  Medication Sig Dispense Refill  . buPROPion (WELLBUTRIN XL) 150 MG 24 hr tablet Take 1 tablet (150 mg total) by mouth daily.  30 tablet  3  . lisinopril (PRINIVIL,ZESTRIL) 40 MG tablet Take 1 tablet (40 mg total) by mouth daily.  30 tablet  1    No Known Allergies  Review of Systems  Review of Systems  Constitutional: Positive for fever and malaise/fatigue.  HENT: Positive for congestion.   Eyes: Negative for discharge.  Respiratory: Positive for cough and sputum production. Negative for shortness of breath and wheezing.   Cardiovascular: Positive for chest pain. Negative for palpitations and leg swelling.  Gastrointestinal: Negative for nausea, abdominal pain and diarrhea.  Genitourinary: Negative  for dysuria.  Musculoskeletal: Positive for myalgias. Negative for back pain and falls.  Skin: Negative for rash.  Neurological: Negative for loss of consciousness and headaches.  Endo/Heme/Allergies: Negative for polydipsia.  Psychiatric/Behavioral: Negative for depression and suicidal ideas. The patient is not nervous/anxious and does not have insomnia.     Objective  BP 138/88  Pulse 94  Temp 98.4 F (36.9 C) (Temporal)  Ht 5\' 4"  (1.626 m)  Wt 167 lb (75.751 kg)  BMI 28.67 kg/m2  SpO2 97%  LMP 10/27/2011  Physical Exam  Physical Exam  Constitutional: She is oriented to person, place, and time and well-developed, well-nourished, and in no distress. No distress.  HENT:  Head: Normocephalic and atraumatic.  Eyes: Conjunctivae normal are  normal.  Neck: Neck supple. No thyromegaly present.  Cardiovascular: Normal rate, regular rhythm and normal heart sounds.   No murmur heard. Pulmonary/Chest: Effort normal. She has no wheezes. She has rales.       B/l bases with rhonchi  Abdominal: She exhibits no distension and no mass.  Musculoskeletal: She exhibits no edema.  Lymphadenopathy:    She has no cervical adenopathy.  Neurological: She is alert and oriented to person, place, and time.  Skin: Skin is warm and dry. No rash noted. She is not diaphoretic.  Psychiatric: Memory, affect and judgment normal.     Lab Results  Component Value Date   CREATININE 0.7 11/11/2011   BUN 10 11/11/2011   NA 139 11/11/2011   K 4.8 11/11/2011   CL 106 11/11/2011   CO2 26 11/11/2011   Lab Results  Component Value Date   ALT 15 11/11/2011   AST 17 11/11/2011   ALKPHOS 72 11/11/2011   BILITOT 0.5 11/11/2011     Assessment & Plan  Hypertension Improved with increased Lisinopril despite acute illness. Continue same  Anxiety and depression Tolerating Wellbutrin XL 150 mg daily. Declines further increase will discuss at next visit  Tobacco abuse Has not smoked a cigarette for days secondary to acute illness, encouraged to try not to go back when she is feeling better.  Acute bronchitis Encouraged increased rest, hydration and mucinex bid, Started on a Zpak and Medrol dosepak

## 2012-02-10 ENCOUNTER — Other Ambulatory Visit: Payer: Self-pay | Admitting: Family Medicine

## 2012-02-11 NOTE — Telephone Encounter (Signed)
eScribe request for refill on LISINOPRIL Last filled - 12/12/11, #30 X 1 Last seen on - 12/12/11 Follow up - 02/16/12 RX sent

## 2012-02-16 ENCOUNTER — Ambulatory Visit: Payer: BC Managed Care – PPO | Admitting: Family Medicine

## 2012-02-16 ENCOUNTER — Encounter: Payer: Self-pay | Admitting: Family Medicine

## 2012-02-16 ENCOUNTER — Ambulatory Visit (INDEPENDENT_AMBULATORY_CARE_PROVIDER_SITE_OTHER): Payer: BC Managed Care – PPO | Admitting: Family Medicine

## 2012-02-16 VITALS — BP 130/88 | HR 90 | Temp 98.8°F | Ht 64.0 in | Wt 167.0 lb

## 2012-02-16 DIAGNOSIS — F1520 Other stimulant dependence, uncomplicated: Secondary | ICD-10-CM

## 2012-02-16 DIAGNOSIS — I10 Essential (primary) hypertension: Secondary | ICD-10-CM

## 2012-02-16 DIAGNOSIS — F17200 Nicotine dependence, unspecified, uncomplicated: Secondary | ICD-10-CM

## 2012-02-16 MED ORDER — LISINOPRIL 40 MG PO TABS: 40.0000 mg | ORAL_TABLET | Freq: Every day | ORAL | Status: DC

## 2012-02-16 NOTE — Progress Notes (Signed)
OFFICE NOTE  02/16/2012  CC:  Chief Complaint  Patient presents with  . Follow-up    blood pressure     HPI: Patient is a 47 y.o. Caucasian female who is here for routine htn f/u.  Lisinopril increased to 40 mg qd relatively. She has cut back on smoking recently.   She wants to cut way back on caffeine intake, gets this through 6 diet sodas per day.    Has been on wellbutrin since about 09/2011 via her GYN, she was stressed and it was started to help her stop smoking.  She can't tell that it is helping and wants to ween off this if it is ok.  Pertinent PMH:  Past Medical History  Diagnosis Date  . Chicken pox   . Hypertension   . Anxiety   . Overweight 11/11/2011  . Preventative health care 11/11/2011  . Anxiety and depression 11/11/2011  . Irregular menses 11/11/2011  . Acute bronchitis 01/27/2012    MEDS:  Outpatient Prescriptions Prior to Visit  Medication Sig Dispense Refill  . buPROPion (WELLBUTRIN XL) 150 MG 24 hr tablet Take 1 tablet (150 mg total) by mouth daily.  30 tablet  3  . lisinopril (PRINIVIL,ZESTRIL) 40 MG tablet TAKE 1 TABLET (40 MG TOTAL) BY MOUTH DAILY.  30 tablet  1  . [DISCONTINUED] azithromycin (ZITHROMAX) 250 MG tablet 2 tabs po once and then 1 tab po daily x 4 days  6 tablet  0  . [DISCONTINUED] chlorpheniramine-HYDROcodone (TUSSIONEX PENNKINETIC ER) 10-8 MG/5ML LQCR Take 5 mLs by mouth at bedtime as needed.  140 mL  1  . [DISCONTINUED] ibuprofen (ADVIL,MOTRIN) 200 MG tablet Take 400 mg by mouth every 6 (six) hours as needed.      . [DISCONTINUED] methylPREDNISolone (MEDROL, PAK,) 4 MG tablet follow package directions  21 tablet  0   Last reviewed on 02/16/2012  8:40 AM by Jeoffrey Massed, MD  PE: Blood pressure 130/88, pulse 90, temperature 98.8 F (37.1 C), temperature source Temporal, height 5\' 4"  (1.626 m), weight 167 lb (75.751 kg), last menstrual period 10/27/2011, SpO2 99.00%. Gen: Alert, well appearing.  Patient is oriented to person, place,  time, and situation. CV: RRR, no m/r/g.   LUNGS: CTA bilat, nonlabored resps, good aeration in all lung fields.   IMPRESSION AND PLAN: Hypertension Problem stable.  Continue current medications and diet appropriate for this condition.  We have reviewed our general long term plan for this problem and also reviewed symptoms and signs that should prompt the patient to call or return to the office. Last lytes 10/2011 were normal.  Tobacco dependence syndrome Spent 3-10 minutes today discussing pt's smoking habit and the short and long term risks of smoking.  I clearly advised patient to completely quit smoking.   Caffeine dependence Discussed slow ween--cut back one caffeinated drink every 4-5 days until down to 1 drink per day max.   An After Visit Summary was printed and given to the patient.  FOLLOW UP: 60mo

## 2012-02-16 NOTE — Assessment & Plan Note (Signed)
Problem stable.  Continue current medications and diet appropriate for this condition.  We have reviewed our general long term plan for this problem and also reviewed symptoms and signs that should prompt the patient to call or return to the office. Last lytes 10/2011 were normal.

## 2012-02-16 NOTE — Assessment & Plan Note (Addendum)
Spent 3-10 minutes today discussing pt's smoking habit and the short and long term risks of smoking.  I clearly advised patient to completely quit smoking.  Discussed ween off of wellbutrin, as she thinks this med has not helped with anything.

## 2012-02-16 NOTE — Patient Instructions (Signed)
Take 1 wellbutrin tab every other day for 10 doses, then stop this med

## 2012-02-16 NOTE — Assessment & Plan Note (Signed)
Discussed slow ween--cut back one caffeinated drink every 4-5 days until down to 1 drink per day max.

## 2012-03-13 ENCOUNTER — Other Ambulatory Visit: Payer: Self-pay | Admitting: Family Medicine

## 2012-03-15 NOTE — Telephone Encounter (Signed)
eScribe request for refill on BUPROPION Last seen on - 02/16/12, pt discussed weaning med at that visit.    Message left for pt to return call to verify if patient is requesting medication refill or if this is an automatic request generated by pharmacy.  RC from patient.  She did not request refill, but she did not taper off med.  She would like refill sent. RX sent.

## 2012-03-19 ENCOUNTER — Other Ambulatory Visit: Payer: Self-pay | Admitting: Family Medicine

## 2012-06-02 ENCOUNTER — Other Ambulatory Visit: Payer: BC Managed Care – PPO

## 2012-07-12 ENCOUNTER — Other Ambulatory Visit (INDEPENDENT_AMBULATORY_CARE_PROVIDER_SITE_OTHER): Payer: BC Managed Care – PPO

## 2012-07-12 DIAGNOSIS — Z111 Encounter for screening for respiratory tuberculosis: Secondary | ICD-10-CM

## 2012-07-14 LAB — TB SKIN TEST
Induration: 0 mm
TB Skin Test: NEGATIVE

## 2012-07-30 ENCOUNTER — Telehealth: Payer: Self-pay | Admitting: Emergency Medicine

## 2012-07-30 MED ORDER — LISINOPRIL 40 MG PO TABS: 40.0000 mg | ORAL_TABLET | Freq: Every day | ORAL | Status: DC

## 2012-07-30 NOTE — Telephone Encounter (Signed)
Patient needs refill on lisinopril, states she runs out tomorrow but has an appointment on 08-17-12. She would like to get a month supply instead of 3 month incase its changed at the appointment.

## 2012-07-30 NOTE — Telephone Encounter (Signed)
Sent lisinopril in per patient for 30 day supple until next OV 08/17/12.

## 2012-08-17 ENCOUNTER — Ambulatory Visit: Payer: BC Managed Care – PPO | Admitting: Family Medicine

## 2012-09-14 ENCOUNTER — Ambulatory Visit (INDEPENDENT_AMBULATORY_CARE_PROVIDER_SITE_OTHER): Payer: BC Managed Care – PPO | Admitting: Family Medicine

## 2012-09-14 ENCOUNTER — Encounter: Payer: Self-pay | Admitting: Family Medicine

## 2012-09-14 VITALS — BP 144/88 | HR 78 | Temp 98.4°F | Resp 16 | Ht 64.0 in | Wt 172.0 lb

## 2012-09-14 DIAGNOSIS — F329 Major depressive disorder, single episode, unspecified: Secondary | ICD-10-CM

## 2012-09-14 DIAGNOSIS — I10 Essential (primary) hypertension: Secondary | ICD-10-CM

## 2012-09-14 DIAGNOSIS — F32A Depression, unspecified: Secondary | ICD-10-CM

## 2012-09-14 DIAGNOSIS — F17200 Nicotine dependence, unspecified, uncomplicated: Secondary | ICD-10-CM

## 2012-09-14 DIAGNOSIS — F341 Dysthymic disorder: Secondary | ICD-10-CM

## 2012-09-14 MED ORDER — LISINOPRIL 40 MG PO TABS: 40.0000 mg | ORAL_TABLET | Freq: Every day | ORAL | Status: DC

## 2012-09-14 MED ORDER — HYDROCHLOROTHIAZIDE 25 MG PO TABS: 25.0000 mg | ORAL_TABLET | Freq: Every day | ORAL | Status: DC

## 2012-09-14 MED ORDER — BUPROPION HCL ER (XL) 300 MG PO TB24: 300.0000 mg | ORAL_TABLET | Freq: Every day | ORAL | Status: DC

## 2012-09-14 NOTE — Progress Notes (Signed)
OFFICE NOTE  09/14/2012  CC:  Chief Complaint  Patient presents with  . Follow-up  . Hypertension     HPI: Patient is a 47 y.o. Caucasian female who is here for 6 mo f/u HTN and tob cess. BP checks at home: avg 140s-150s  Over 80s-90s. She is searching for motivation to "get things together". She does not feel depressed.    She's noticing irregular menses.  Needs to see her GYN to get this looked into and get mammogram scheduled. Currently still smoking.  Drinks 2 beers a day average, more on weekends, says she knows she needs to cut back.   Pertinent PMH:  HTN Anxiety and depression tob dependence  MEDS:  Outpatient Prescriptions Prior to Visit  Medication Sig Dispense Refill  . lisinopril (PRINIVIL,ZESTRIL) 40 MG tablet Take 1 tablet (40 mg total) by mouth daily.  30 tablet  0  . buPROPion (WELLBUTRIN XL) 150 MG 24 hr tablet TAKE 1 TABLET BY MOUTH EVERY DAY  30 tablet  3   No facility-administered medications prior to visit.    PE: Blood pressure 144/88, pulse 78, temperature 98.4 F (36.9 C), temperature source Temporal, resp. rate 16, height 5\' 4"  (1.626 m), weight 172 lb (78.019 kg), last menstrual period 08/21/2012, SpO2 97.00%. Gen: Alert, well appearing.  Patient is oriented to person, place, time, and situation. AFFECT: pleasant, lucid thought and speech.  She did get tearful at times today when talking about needing to "get things together". CV: RRR, no m/r/g.   LUNGS: CTA bilat, nonlabored resps, good aeration in all lung fields. EXT: no clubbing, cyanosis, or edema.    IMPRESSION AND PLAN:  1) HTN, not well controlled.  Add HCTZ 25mg  qd. She'll be getting blood work at her GYN within about 1 month and these will be sent to me for review.  2) Tob cessation: working on it.  She's also working on cutting back on drinking alcohol.  Emotional support given today.  She'll continue seeing a therapist.  3) Anxiety/depression:   Not ideal control.  Increase  wellbutrin XL to 150 qd.  4) Irreg menses: she feels like she is beginning menopause and will go to see her GYN in about 1 mo. She'll schedule her annual mammogram at that time. She reports being UTD with PAP---she was told by GYN her next one would be in 5 yrs.  FOLLOW UP: 31mo, earlier if bp not in goal range of <135/80 in 1 month.

## 2013-06-09 ENCOUNTER — Other Ambulatory Visit: Payer: Self-pay | Admitting: Family Medicine

## 2013-07-02 ENCOUNTER — Other Ambulatory Visit: Payer: Self-pay | Admitting: Family Medicine

## 2013-08-11 ENCOUNTER — Ambulatory Visit (INDEPENDENT_AMBULATORY_CARE_PROVIDER_SITE_OTHER): Payer: BC Managed Care – PPO | Admitting: Family Medicine

## 2013-08-11 ENCOUNTER — Encounter: Payer: Self-pay | Admitting: Family Medicine

## 2013-08-11 VITALS — BP 140/88 | HR 85 | Temp 98.3°F | Resp 16 | Ht 64.0 in | Wt 175.0 lb

## 2013-08-11 DIAGNOSIS — I10 Essential (primary) hypertension: Secondary | ICD-10-CM

## 2013-08-11 DIAGNOSIS — F17200 Nicotine dependence, unspecified, uncomplicated: Secondary | ICD-10-CM

## 2013-08-11 MED ORDER — LISINOPRIL 40 MG PO TABS: 40.0000 mg | ORAL_TABLET | Freq: Every day | ORAL | Status: DC

## 2013-08-11 MED ORDER — HYDROCHLOROTHIAZIDE 25 MG PO TABS: ORAL_TABLET | ORAL | Status: DC

## 2013-08-11 NOTE — Progress Notes (Signed)
OFFICE NOTE  08/11/2013  CC:  Chief Complaint  Patient presents with  . Medication Refill    BP meds   HPI: Patient is a 48 y.o. Caucasian female who is here for f/u HTN. I last saw her 11 mo ago.  RAn out of HCTZ, still taking lisinopril. 120-30/80s.  Last week at the beach on vacation her ankles swelled.  They are back to normal now. She had normal electrolytes and cholesterol at her GYN's 2014/october---she'll get these to me.  Not contemplating smoking cessation at this time.  Work is fine. Has not been on wellbutrin "for a long time".   Pertinent PMH:  Past Medical History  Diagnosis Date  . Hypertension   . Overweight(278.02) 11/11/2011  . Anxiety and depression 11/11/2011  . Irregular menses 11/11/2011  . Tobacco dependence    Past Surgical History  Procedure Laterality Date  . Cesarean section  1991   History   Social History Narrative   Monogamous relationship as of 08/2012.   Has one adult son.   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   +Smoker, active as of 08/2012.   +Alcohol 2 beers a day, more on weekends as of 08/2012.   No drugs.             MEDS: NOt on wellbutrin as listed below Outpatient Prescriptions Prior to Visit  Medication Sig Dispense Refill  . buPROPion (WELLBUTRIN XL) 300 MG 24 hr tablet Take 1 tablet (300 mg total) by mouth daily.  30 tablet  6  . hydrochlorothiazide (HYDRODIURIL) 25 MG tablet TAKE 1 TABLET (25 MG TOTAL) BY MOUTH DAILY.  30 tablet  0  . lisinopril (PRINIVIL,ZESTRIL) 40 MG tablet Take 1 tablet (40 mg total) by mouth daily.  30 tablet  6   No facility-administered medications prior to visit.    PE: Blood pressure 140/88, pulse 85, temperature 98.3 F (36.8 C), temperature source Temporal, resp. rate 16, height 5\' 4"  (1.626 m), weight 175 lb (79.379 kg), SpO2 95.00%. Gen: Alert, well appearing.  Patient is oriented to person, place, time, and situation. AFFECT: pleasant, lucid thought and  speech. OMB:TDHR: no injection, icteris, swelling, or exudate.  EOMI, PERRLA. Mouth: lips without lesion/swelling.  Oral mucosa pink and moist. Oropharynx without erythema, exudate, or swelling.  CV: RRR, no m/r/g.   LUNGS: CTA bilat, nonlabored resps, good aeration in all lung fields. EXT: no clubbing, cyanosis, or edema.    IMPRESSION AND PLAN:  HTN; well controlled.  RF HCTZ and lisinopril. Encouraged diet/exercise/low sodium. She will get [place] the labs she had at her GYN last October.  Tobacco dependence: encouraged cessation but pt not ready to do this at this time.  An After Visit Summary was printed and given to the patient.  FOLLOW UP: CPE at her convenience

## 2013-08-11 NOTE — Progress Notes (Signed)
Pre visit review using our clinic review tool, if applicable. No additional management support is needed unless otherwise documented below in the visit note. 

## 2013-08-12 ENCOUNTER — Telehealth: Payer: Self-pay | Admitting: Family Medicine

## 2013-08-12 NOTE — Telephone Encounter (Signed)
Relevant patient education assigned to patient using Emmi. ° °

## 2013-08-22 ENCOUNTER — Telehealth: Payer: Self-pay | Admitting: Family Medicine

## 2013-08-22 ENCOUNTER — Encounter: Payer: Self-pay | Admitting: Family Medicine

## 2013-08-22 NOTE — Telephone Encounter (Signed)
After reviewing her GYN records I see that she has not had a mammogram since 2013.  I recommend she get this screening test once per year. If she's agreeable, I'll order this--just ask her where she wants to go.  If she would rather get this set up through her GYN, that's fine.  I just wanted her to be aware that she is overdue for this test.-thx

## 2013-08-23 NOTE — Telephone Encounter (Signed)
Patient states that she is aware.  She plans to make her own appointment to do that soon. I advised her that it is important for her to get it once yearly.  Pt voiced understanding.

## 2013-11-07 ENCOUNTER — Telehealth: Payer: Self-pay

## 2013-11-07 ENCOUNTER — Other Ambulatory Visit (INDEPENDENT_AMBULATORY_CARE_PROVIDER_SITE_OTHER): Payer: BC Managed Care – PPO

## 2013-11-07 DIAGNOSIS — N910 Primary amenorrhea: Secondary | ICD-10-CM

## 2013-11-07 DIAGNOSIS — Z0000 Encounter for general adult medical examination without abnormal findings: Secondary | ICD-10-CM

## 2013-11-07 LAB — LIPID PANEL
CHOLESTEROL: 139 mg/dL (ref 0–200)
HDL: 60.4 mg/dL (ref >39.00)
LDL CALC: 73 mg/dL (ref 0–99)
NonHDL: 78.6
TRIGLYCERIDES: 30 mg/dL (ref 0.0–149.0)
Total CHOL/HDL Ratio: 2
VLDL: 6 mg/dL (ref 0.0–40.0)

## 2013-11-07 LAB — CBC WITH DIFFERENTIAL/PLATELET
BASOS ABS: 0 10*3/uL (ref 0.0–0.1)
BASOS PCT: 0.4 % (ref 0.0–3.0)
Eosinophils Absolute: 0.1 10*3/uL (ref 0.0–0.7)
Eosinophils Relative: 1.7 % (ref 0.0–5.0)
HEMATOCRIT: 44 % (ref 36.0–46.0)
HEMOGLOBIN: 14.6 g/dL (ref 12.0–15.0)
LYMPHS PCT: 32.2 % (ref 12.0–46.0)
Lymphs Abs: 2.3 10*3/uL (ref 0.7–4.0)
MCHC: 33.2 g/dL (ref 30.0–36.0)
MCV: 93.4 fl (ref 78.0–100.0)
Monocytes Absolute: 0.5 10*3/uL (ref 0.1–1.0)
Monocytes Relative: 7.6 % (ref 3.0–12.0)
NEUTROS PCT: 58.1 % (ref 43.0–77.0)
Neutro Abs: 4.1 10*3/uL (ref 1.4–7.7)
Platelets: 276 10*3/uL (ref 150.0–400.0)
RBC: 4.71 Mil/uL (ref 3.87–5.11)
RDW: 13.1 % (ref 11.5–15.5)
WBC: 7 10*3/uL (ref 4.0–10.5)

## 2013-11-07 LAB — COMPREHENSIVE METABOLIC PANEL
ALBUMIN: 3.5 g/dL (ref 3.5–5.2)
ALT: 17 U/L (ref 0–35)
AST: 18 U/L (ref 0–37)
Alkaline Phosphatase: 83 U/L (ref 39–117)
BUN: 15 mg/dL (ref 6–23)
CALCIUM: 9.3 mg/dL (ref 8.4–10.5)
CO2: 28 meq/L (ref 19–32)
CREATININE: 0.7 mg/dL (ref 0.4–1.2)
Chloride: 102 mEq/L (ref 96–112)
GFR: 92.85 mL/min (ref >60.00)
GLUCOSE: 75 mg/dL (ref 70–99)
POTASSIUM: 4.6 meq/L (ref 3.5–5.1)
SODIUM: 140 meq/L (ref 135–145)
Total Bilirubin: 0.6 mg/dL (ref 0.2–1.2)
Total Protein: 7.3 g/dL (ref 6.0–8.3)

## 2013-11-07 LAB — TSH: TSH: 0.62 u[IU]/mL (ref 0.35–4.50)

## 2013-11-07 NOTE — Telephone Encounter (Signed)
Ok added test.

## 2013-11-07 NOTE — Telephone Encounter (Signed)
Pt came in for bloodwork and requested to add a [place] to her labs for amenorrhea. Please advise

## 2013-11-07 NOTE — Telephone Encounter (Signed)
OK, pls add FSH and LH, dx is primary amenorrhea.-thx

## 2013-11-08 ENCOUNTER — Other Ambulatory Visit: Payer: BC Managed Care – PPO

## 2013-11-08 LAB — FSH/LH
FSH: 86.7 m[IU]/mL
LH: 50 m[IU]/mL

## 2013-11-09 ENCOUNTER — Other Ambulatory Visit: Payer: BC Managed Care – PPO

## 2013-11-16 ENCOUNTER — Encounter: Payer: BC Managed Care – PPO | Admitting: Family Medicine

## 2013-11-16 ENCOUNTER — Ambulatory Visit (INDEPENDENT_AMBULATORY_CARE_PROVIDER_SITE_OTHER): Payer: BC Managed Care – PPO | Admitting: Family Medicine

## 2013-11-16 ENCOUNTER — Encounter: Payer: Self-pay | Admitting: Family Medicine

## 2013-11-16 VITALS — BP 110/72 | HR 81 | Temp 98.4°F | Resp 18 | Ht 64.0 in | Wt 175.0 lb

## 2013-11-16 DIAGNOSIS — Z0000 Encounter for general adult medical examination without abnormal findings: Secondary | ICD-10-CM | POA: Insufficient documentation

## 2013-11-16 NOTE — Assessment & Plan Note (Signed)
Reviewed age and gender appropriate health maintenance issues (prudent diet, regular exercise, health risks of tobacco and excessive alcohol, use of seatbelts, fire alarms in home, use of sunscreen).  Also reviewed age and gender appropriate health screening as well as vaccine recommendations. HP labs all great. She is now essentially postmenopausal but has no bothersome sx's.   She'll arrange screening mammogram ASAP.  Next pap not due until after 09/2016 (as per her GYN). Start colon cancer screening at her next CPE in 1 yr. Vit D and calcium supplementation discussed. Encouraged smoking cessation but she is not contemplating quitting at this time. Diet/exercise discussed and encouraged.

## 2013-11-16 NOTE — Progress Notes (Signed)
Pre visit review using our clinic review tool, if applicable. No additional management support is needed unless otherwise documented below in the visit note. 

## 2013-11-16 NOTE — Progress Notes (Signed)
Office Note 11/16/2013  CC:  Chief Complaint  Patient presents with  . Annual Exam   HPI:  Kristin Day is a 48 y.o. White female who is here for her annual health maintenance exam. We reviewed her recent fasting health panel labs in detail today, as well as her recent [facility] and LH (both of which were elevated, indicating menopause).   No acute complaints today. Last pap was 09/2011 and was normal, GYN said next pap in 5 yrs.  She is overdue for mammogram (last was 2011). Not dieting or exercising any.  Still smoking.  Past Medical History  Diagnosis Date  . Hypertension   . Overweight(278.02) 11/11/2011    Lipids excellent 10/2012  . Anxiety and depression 11/11/2011  . Menometrorrhagia 11/11/2011  . Tobacco dependence   . Perimenopausal disorder 2013    Past Surgical History  Procedure Laterality Date  . Cesarean section  1991    Family History  Problem Relation Age of Onset  . COPD Mother     smoker  . Emphysema Mother   . Hypertension Father   . Cancer Father     skin/BCC and prostate  . Cancer Maternal Grandmother     ovarian  . Emphysema Maternal Grandfather     ?  Marland Kitchen Heart disease Paternal Grandmother   . Heart disease Paternal Grandfather   . Hypertension Paternal Grandfather   . Diabetes Paternal Grandfather     History   Social History  . Marital Status: Married    Spouse Name: N/A    Number of Children: N/A  . Years of Education: N/A   Occupational History  . Not on file.   Social History Main Topics  . Smoking status: Current Every Day Smoker -- 0.50 packs/day for 30 years    Types: Cigarettes  . Smokeless tobacco: Never Used  . Alcohol Use: Yes     Comment: 6-12 beers weekly  . Drug Use: No  . Sexual Activity: Yes    Partners: Male   Other Topics Concern  . Not on file   Social History Narrative   Monogamous relationship as of 08/2012.   Has one adult son.   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   +Smoker, active  as of 08/2012.   +Alcohol 2 beers a day, more on weekends as of 08/2012.   No drugs.             Outpatient Prescriptions Prior to Visit  Medication Sig Dispense Refill  . hydrochlorothiazide (HYDRODIURIL) 25 MG tablet TAKE 1 TABLET (25 MG TOTAL) BY MOUTH DAILY.  30 tablet  11  . lisinopril (PRINIVIL,ZESTRIL) 40 MG tablet Take 1 tablet (40 mg total) by mouth daily.  30 tablet  11   No facility-administered medications prior to visit.    No Known Allergies  ROS Review of Systems  Constitutional: Negative for fever, chills, appetite change and fatigue.  HENT: Negative for congestion, dental problem, ear pain and sore throat.   Eyes: Negative for discharge, redness and visual disturbance.  Respiratory: Negative for cough, chest tightness, shortness of breath and wheezing.   Cardiovascular: Negative for chest pain, palpitations and leg swelling.  Gastrointestinal: Negative for nausea, vomiting, abdominal pain, diarrhea and blood in stool.  Genitourinary: Negative for dysuria, urgency, frequency, hematuria, flank pain and difficulty urinating.  Musculoskeletal: Negative for arthralgias, back pain, joint swelling, myalgias and neck stiffness.  Skin: Negative for pallor and rash.  Neurological: Negative for dizziness,  speech difficulty, weakness and headaches.  Hematological: Negative for adenopathy. Does not bruise/bleed easily.  Psychiatric/Behavioral: Negative for confusion and sleep disturbance. The patient is not nervous/anxious.     PE; Blood pressure 110/72, pulse 81, temperature 98.4 F (36.9 C), temperature source Temporal, resp. rate 18, height 5\' 4"  (1.626 m), weight 175 lb (79.379 kg), SpO2 99.00%. Gen: Alert, well appearing.  Patient is oriented to person, place, time, and situation. AFFECT: pleasant, lucid thought and speech. ENT: Ears: EACs clear, normal epithelium.  TMs with good light reflex and landmarks bilaterally.  Eyes: no injection, icteris, swelling, or exudate.   EOMI, PERRLA. Nose: no drainage or turbinate edema/swelling.  No injection or focal lesion.  Mouth: lips without lesion/swelling.  Oral mucosa pink and moist.  Dentition intact and without obvious caries or gingival swelling.  Oropharynx without erythema, exudate, or swelling.  Neck: supple/nontender.  No LAD, mass, or TM.  Carotid pulses 2+ bilaterally, without bruits. CV: RRR, no m/r/g.   LUNGS: CTA bilat, nonlabored resps, good aeration in all lung fields. ABD: soft, NT, ND, BS normal.  No hepatospenomegaly or mass.  No bruits. EXT: no clubbing, cyanosis, or edema.  Musculoskeletal: no joint swelling, erythema, warmth, or tenderness.  ROM of all joints intact. Skin - no sores or suspicious lesions or rashes or color changes   Pertinent labs:  Lab Results  Component Value Date   WBC 7.0 11/07/2013   HGB 14.6 11/07/2013   HCT 44.0 11/07/2013   MCV 93.4 11/07/2013   PLT 276.0 11/07/2013   Lab Results  Component Value Date   CHOL 139 11/07/2013   HDL 60.40 11/07/2013   LDLCALC 73 11/07/2013   TRIG 30.0 11/07/2013   CHOLHDL 2 11/07/2013     Chemistry      Component Value Date/Time   NA 140 11/07/2013 0924   K 4.6 11/07/2013 0924   CL 102 11/07/2013 0924   CO2 28 11/07/2013 0924   BUN 15 11/07/2013 0924   CREATININE 0.7 11/07/2013 0924      Component Value Date/Time   CALCIUM 9.3 11/07/2013 0924   ALKPHOS 83 11/07/2013 0924   AST 18 11/07/2013 0924   ALT 17 11/07/2013 0924   BILITOT 0.6 11/07/2013 0924     Lab Results  Component Value Date   TSH 0.62 11/07/2013    ASSESSMENT AND PLAN:   Health maintenance examination Reviewed age and gender appropriate health maintenance issues (prudent diet, regular exercise, health risks of tobacco and excessive alcohol, use of seatbelts, fire alarms in home, use of sunscreen).  Also reviewed age and gender appropriate health screening as well as vaccine recommendations. HP labs all great. She is now essentially postmenopausal  but has no bothersome sx's.   She'll arrange screening mammogram ASAP.  Next pap not due until after 09/2016 (as per her GYN). Start colon cancer screening at her next CPE in 1 yr. Vit D and calcium supplementation discussed. Encouraged smoking cessation but she is not contemplating quitting at this time. Diet/exercise discussed and encouraged.  An After Visit Summary was printed and given to the patient.  FOLLOW UP:  Return in about 6 months (around 05/18/2014) for f/u HTN and tob abuse.

## 2013-11-17 ENCOUNTER — Encounter: Payer: BC Managed Care – PPO | Admitting: Family Medicine

## 2013-11-17 ENCOUNTER — Telehealth: Payer: Self-pay | Admitting: Family Medicine

## 2013-11-17 NOTE — Telephone Encounter (Signed)
emmi emailed °

## 2013-11-24 ENCOUNTER — Other Ambulatory Visit: Payer: BC Managed Care – PPO

## 2013-12-01 ENCOUNTER — Encounter: Payer: BC Managed Care – PPO | Admitting: Family Medicine

## 2014-08-30 ENCOUNTER — Other Ambulatory Visit: Payer: Self-pay | Admitting: *Deleted

## 2014-08-30 MED ORDER — HYDROCHLOROTHIAZIDE 25 MG PO TABS: ORAL_TABLET | ORAL | Status: DC

## 2014-08-30 MED ORDER — LISINOPRIL 40 MG PO TABS: 40.0000 mg | ORAL_TABLET | Freq: Every day | ORAL | Status: DC

## 2014-08-30 NOTE — Telephone Encounter (Signed)
LOV: 11/16/13 NOV: None, called pt and scheduled apt for 09/11/14 at 3:45pm  RF request for hctz Last written: 08/11/13 #30 w/ 11RF  RF request for lisinopril Last written: 08/11/13 #30 w/ 11RF

## 2014-09-11 ENCOUNTER — Ambulatory Visit (INDEPENDENT_AMBULATORY_CARE_PROVIDER_SITE_OTHER): Payer: 59 | Admitting: Family Medicine

## 2014-09-11 ENCOUNTER — Encounter: Payer: Self-pay | Admitting: Family Medicine

## 2014-09-11 VITALS — BP 124/82 | HR 86 | Temp 97.9°F | Resp 16 | Ht 64.0 in | Wt 172.0 lb

## 2014-09-11 DIAGNOSIS — F17200 Nicotine dependence, unspecified, uncomplicated: Secondary | ICD-10-CM

## 2014-09-11 DIAGNOSIS — Z1239 Encounter for other screening for malignant neoplasm of breast: Secondary | ICD-10-CM

## 2014-09-11 DIAGNOSIS — I10 Essential (primary) hypertension: Secondary | ICD-10-CM

## 2014-09-11 MED ORDER — LISINOPRIL 40 MG PO TABS: 40.0000 mg | ORAL_TABLET | Freq: Every day | ORAL | Status: DC

## 2014-09-11 MED ORDER — HYDROCHLOROTHIAZIDE 25 MG PO TABS: ORAL_TABLET | ORAL | Status: DC

## 2014-09-11 NOTE — Progress Notes (Signed)
OFFICE VISIT  09/11/2014   CC:  Chief Complaint  Patient presents with  . Follow-up     HPI:    Patient is a 49 y.o. Caucasian female who presents for 9 mo f/u HTN and tobacco abuse. Some random bp checks are: normal.  Not checking often, though. No HA's, no CP, no SOB, no palpitations. Still smoking, not contemplating quitting at this time.  No new complaints, except she asks that I order her a mammogram since she is past due.  Past Medical History  Diagnosis Date  . Hypertension   . Overweight(278.02) 11/11/2011    Lipids excellent 10/2012  . Anxiety and depression 11/11/2011  . Menometrorrhagia 11/11/2011  . Tobacco dependence   . Perimenopausal disorder 2013    Past Surgical History  Procedure Laterality Date  . Cesarean section  1991    Outpatient Prescriptions Prior to Visit  Medication Sig Dispense Refill  . hydrochlorothiazide (HYDRODIURIL) 25 MG tablet TAKE 1 TABLET (25 MG TOTAL) BY MOUTH DAILY. 30 tablet 0  . lisinopril (PRINIVIL,ZESTRIL) 40 MG tablet Take 1 tablet (40 mg total) by mouth daily. 30 tablet 0   No facility-administered medications prior to visit.    No Known Allergies  ROS As per HPI  PE: Blood pressure 124/82, pulse 86, temperature 97.9 F (36.6 C), temperature source Oral, resp. rate 16, height 5\' 4"  (1.626 m), weight 172 lb (78.019 kg), SpO2 95 %. Gen: Alert, well appearing.  Patient is oriented to person, place, time, and situation. CV: RRR, no m/r/g.   LUNGS: CTA bilat, nonlabored resps, good aeration in all lung fields. EXT: no clubbing, cyanosis, or edema.    LABS:  Lab Results  Component Value Date   TSH 0.62 11/07/2013   Lab Results  Component Value Date   WBC 7.0 11/07/2013   HGB 14.6 11/07/2013   HCT 44.0 11/07/2013   MCV 93.4 11/07/2013   PLT 276.0 11/07/2013   Lab Results  Component Value Date   CREATININE 0.7 11/07/2013   BUN 15 11/07/2013   NA 140 11/07/2013   K 4.6 11/07/2013   CL 102 11/07/2013   CO2  28 11/07/2013   Lab Results  Component Value Date   ALT 17 11/07/2013   AST 18 11/07/2013   ALKPHOS 83 11/07/2013   BILITOT 0.6 11/07/2013   Lab Results  Component Value Date   CHOL 139 11/07/2013   Lab Results  Component Value Date   HDL 60.40 11/07/2013   Lab Results  Component Value Date   LDLCALC 73 11/07/2013   Lab Results  Component Value Date   TRIG 30.0 11/07/2013   Lab Results  Component Value Date   CHOLHDL 2 11/07/2013   IMPRESSION AND PLAN:  1) HTN: The current medical regimen is effective;  continue present plan and medications. RF'd meds to get her to her next app: her CPE in 3 mo's.  2) Tob dependence: not contemplating quitting.  I encouraged pt to quit today.  3) Breast cancer screening: ordered mammogram for pt today.  An After Visit Summary was printed and given to the patient.  FOLLOW UP: Return in about 3 months (around 12/12/2014) for fasting CPE.

## 2014-09-11 NOTE — Progress Notes (Signed)
Pre visit review using our clinic review tool, if applicable. No additional management support is needed unless otherwise documented below in the visit note. 

## 2014-09-12 ENCOUNTER — Other Ambulatory Visit: Payer: Self-pay | Admitting: Family Medicine

## 2014-09-12 DIAGNOSIS — Z1231 Encounter for screening mammogram for malignant neoplasm of breast: Secondary | ICD-10-CM

## 2014-11-07 ENCOUNTER — Ambulatory Visit
Admission: RE | Admit: 2014-11-07 | Discharge: 2014-11-07 | Disposition: A | Discharge status: Home / Self Care | Payer: 59 | Source: Ambulatory Visit | Attending: Family Medicine | Admitting: Family Medicine

## 2014-11-07 DIAGNOSIS — Z1231 Encounter for screening mammogram for malignant neoplasm of breast: Secondary | ICD-10-CM

## 2014-11-14 ENCOUNTER — Other Ambulatory Visit: Payer: Self-pay | Admitting: *Deleted

## 2014-11-14 MED ORDER — LISINOPRIL 40 MG PO TABS: 40.0000 mg | ORAL_TABLET | Freq: Every day | ORAL | Status: DC

## 2014-11-14 MED ORDER — HYDROCHLOROTHIAZIDE 25 MG PO TABS: ORAL_TABLET | ORAL | Status: DC

## 2014-11-14 NOTE — Telephone Encounter (Signed)
LOV: 09/11/14 NOVL 11/23/14  RF request for lisinopril Last written: 09/11/14 #30 w/ 1RF  RF request for hctz Last written: 09/11/14 #30 w/ 1RF

## 2014-11-17 ENCOUNTER — Encounter: Payer: Self-pay | Admitting: Family Medicine

## 2014-11-17 ENCOUNTER — Ambulatory Visit (INDEPENDENT_AMBULATORY_CARE_PROVIDER_SITE_OTHER): Payer: 59 | Admitting: Family Medicine

## 2014-11-17 VITALS — BP 128/83 | HR 79 | Temp 98.0°F | Resp 20 | Wt 171.5 lb

## 2014-11-17 DIAGNOSIS — R05 Cough: Secondary | ICD-10-CM

## 2014-11-17 DIAGNOSIS — R059 Cough, unspecified: Secondary | ICD-10-CM | POA: Insufficient documentation

## 2014-11-17 DIAGNOSIS — Z720 Tobacco use: Secondary | ICD-10-CM | POA: Diagnosis not present

## 2014-11-17 DIAGNOSIS — J040 Acute laryngitis: Secondary | ICD-10-CM

## 2014-11-17 MED ORDER — BENZONATATE 200 MG PO CAPS: 200.0000 mg | ORAL_CAPSULE | Freq: Two times a day (BID) | ORAL | Status: DC | PRN

## 2014-11-17 MED ORDER — HYDROCHLOROTHIAZIDE 25 MG PO TABS: ORAL_TABLET | ORAL | Status: DC

## 2014-11-17 MED ORDER — PREDNISONE 20 MG PO TABS: 40.0000 mg | ORAL_TABLET | Freq: Every day | ORAL | Status: DC

## 2014-11-17 MED ORDER — LISINOPRIL 40 MG PO TABS
40.0000 mg | ORAL_TABLET | Freq: Every day | ORAL | Status: DC
Start: 2014-11-17 — End: 2014-12-18

## 2014-11-17 NOTE — Progress Notes (Signed)
Subjective:    Patient ID: Kristin Day, female    DOB: [birth year], 49 y.o.   MRN: [id number]  HPI  Cough/hoarseness: Patient presents for acute office visit today for harsh cough and hoarseness. She states Saturday afternoon she was out in the cold for ball game, screaming and that evening she felt she experienced bad "heartburn". She states she's not had heartburn before. She did admit to a bad taste in her throat, bile-like taste on Sunday morning when she woke up. She endorses a mild sore throat when she woke up Sunday morning as well as fatigue and cough. She states she started using cough drops through the day, and NyQuil at night. She states she is sleeping very well with use of NyQuil. She denies any nausea, vomit, diarrhea or rash. She endorses mild low-grade fever on Sunday morning. She is eating and drinking well. She has concerns about talking with her job.   Past Medical History  Diagnosis Date  . Hypertension   . Overweight(278.02) 11/11/2011    Lipids excellent 10/2012  . Anxiety and depression 11/11/2011  . Menometrorrhagia 11/11/2011  . Tobacco dependence   . Perimenopausal disorder 2013   Social History   Social History  . Marital Status: Married    Spouse Name: N/A  . Number of Children: N/A  . Years of Education: N/A   Occupational History  . Not on file.   Social History Main Topics  . Smoking status: Current Every Day Smoker -- 0.50 packs/day for 30 years    Types: Cigarettes  . Smokeless tobacco: Never Used  . Alcohol Use: Yes     Comment: 6-12 beers weekly  . Drug Use: No  . Sexual Activity:    Partners: Male   Other Topics Concern  . Not on file   Social History Narrative   Monogamous relationship as of 08/2012.   Has one adult son.   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   +Smoker, active as of 08/2012.   +Alcohol 2 beers a day, more on weekends as of 08/2012.   No drugs.            Review of Systems Negative, with the  exception of above mentioned in HPI     Objective:   Physical Exam BP 128/83 mmHg  Pulse 79  Temp(Src) 98 F (36.7 C) (Oral)  Resp 20  Wt 171 lb 8 oz (77.792 kg)  SpO2 96%  LMP 04/20/2013 Gen: Afebrile. No acute distress. Nontoxic in appearance, well-developed, well-nourished, Caucasian female. HENT: AT. [place]. Bilateral TM visualized and normal in appearance. MMM. Bilateral nares with mild erythema and no swelling. Throat without erythema or exudates. Hoarseness on exam, cough on exam. Eyes:Pupils Equal Round Reactive to light, Extraocular movements intact,  Conjunctiva without redness, discharge or icterus. Neck/lymp/endocrine: Supple, no lymphadenopathy CV: RRR  Chest: CTAB, no wheeze or crackles Abd: Soft. Round. NTND. BS present. No Masses palpated.  Skin: No rashes, purpura or petechiae.      Assessment & Plan:  1. Tobacco abuse - Patient encouraged to quit smoking  2. Laryngitis - AVS on laryngitis, patient encouraged to use humidifier. Discussed with patient the pharyngitis can be a viral infection, but also can have come from her vocal strain when screaming at the game or some acid reflux. She also has signs and symptoms of mild URI. Discussed symptomatic treatment with fluids, moist air in short course of steroids so that she may return to  her job. - Encourage her to start using Flonase if able to tolerate, otherwise at least use nasal saline. - predniSONE (DELTASONE) 20 MG tablet; Take 2 tablets (40 mg total) by mouth daily with breakfast.  Dispense: 10 tablet; Refill: 0  3. Cough - benzonatate (TESSALON) 200 MG capsule; Take 1 capsule (200 mg total) by mouth 2 (two) times daily as needed for cough.  Dispense: 20 capsule; Refill: 0  Follow-up one week if no improvement

## 2014-11-17 NOTE — Patient Instructions (Signed)
Laryngitis  Laryngitis is inflammation of your vocal cords. This causes hoarseness, coughing, loss of voice, sore throat, or a dry throat. Your vocal cords are two bands of muscles that are found in your throat. When you speak, these cords come together and vibrate. These vibrations come out through your mouth as sound. When your vocal cords are inflamed, your voice sounds different.  Laryngitis can be temporary (acute) or long-term (chronic). Most cases of acute laryngitis improve with time. Chronic laryngitis is laryngitis that lasts for more than three weeks.  CAUSES  Acute laryngitis may be caused by:   A viral infection.   Lots of talking, yelling, or singing. This is also called vocal strain.   Bacterial infections.  Chronic laryngitis may be caused by:   Vocal strain.   Injury to your vocal cords.   Acid reflux (gastroesophageal reflux disease or GERD).   Allergies.   Sinus infection.   Smoking.   Alcohol abuse.   Breathing in chemicals or dust.   Growths on the vocal cords.  RISK FACTORS  Risk factors for laryngitis include:   Smoking.   Alcohol abuse.   Having allergies.  SIGNS AND SYMPTOMS  Symptoms of laryngitis may include:   Low, hoarse voice.   Loss of voice.   Dry cough.   Sore throat.   Stuffy nose.  DIAGNOSIS  Laryngitis may be diagnosed by:   Physical exam.   Throat culture.   Blood test.   Laryngoscopy. This procedure allows your health care provider to look at your vocal cords with a mirror or viewing tube.  TREATMENT  Treatment for laryngitis depends on what is causing it. Usually, treatment involves resting your voice and using medicines to soothe your throat. However, if your laryngitis is caused by a bacterial infection, you may need to take antibiotic medicine. If your laryngitis is caused by a growth, you may need to have a procedure to remove it.  HOME CARE INSTRUCTIONS   Drink enough fluid to keep your urine clear or pale yellow.   Breathe in moist air. Use a  humidifier if you live in a dry climate.   Take medicines only as directed by your health care provider.   If you were prescribed an antibiotic medicine, finish it all even if you start to feel better.   Do not smoke cigarettes or electronic cigarettes. If you need help quitting, ask your health care provider.   Talk as little as possible. Also avoid whispering, which can cause vocal strain.   Write instead of talking. Do this until your voice is back to normal.  SEEK MEDICAL CARE IF:   You have a fever.   You have increasing pain.   You have difficulty swallowing.  SEEK IMMEDIATE MEDICAL CARE IF:   You cough up blood.   You have trouble breathing.     This information is not intended to replace advice given to you by your health care provider. Make sure you discuss any questions you have with your health care provider.     Document Released: 01/06/2005 Document Revised: 01/27/2014 Document Reviewed: 06/21/2013  [employer]vier Interactive Patient Education 2016 [employer]

## 2014-11-21 ENCOUNTER — Other Ambulatory Visit (INDEPENDENT_AMBULATORY_CARE_PROVIDER_SITE_OTHER): Payer: 59

## 2014-11-21 DIAGNOSIS — Z0000 Encounter for general adult medical examination without abnormal findings: Secondary | ICD-10-CM | POA: Diagnosis not present

## 2014-11-21 LAB — COMPREHENSIVE METABOLIC PANEL
ALT: 16 U/L (ref 0–35)
AST: 15 U/L (ref 0–37)
Albumin: 4.1 g/dL (ref 3.5–5.2)
Alkaline Phosphatase: 83 U/L (ref 39–117)
BUN: 14 mg/dL (ref 6–23)
CALCIUM: 9.5 mg/dL (ref 8.4–10.5)
CHLORIDE: 97 meq/L (ref 96–112)
CO2: 32 meq/L (ref 19–32)
CREATININE: 0.78 mg/dL (ref 0.40–1.20)
GFR: 82.95 mL/min (ref >60.00)
GLUCOSE: 81 mg/dL (ref 70–99)
Potassium: 4.1 mEq/L (ref 3.5–5.1)
SODIUM: 136 meq/L (ref 135–145)
Total Bilirubin: 0.4 mg/dL (ref 0.2–1.2)
Total Protein: 7 g/dL (ref 6.0–8.3)

## 2014-11-21 LAB — CBC WITH DIFFERENTIAL/PLATELET
Basophils Absolute: 0 10*3/uL (ref 0.0–0.1)
Basophils Relative: 0.3 % (ref 0.0–3.0)
EOS PCT: 1.4 % (ref 0.0–5.0)
Eosinophils Absolute: 0.1 10*3/uL (ref 0.0–0.7)
HEMATOCRIT: 44.8 % (ref 36.0–46.0)
HEMOGLOBIN: 14.9 g/dL (ref 12.0–15.0)
LYMPHS PCT: 50.7 % — AB (ref 12.0–46.0)
Lymphs Abs: 5.3 10*3/uL — ABNORMAL HIGH (ref 0.7–4.0)
MCHC: 33.2 g/dL (ref 30.0–36.0)
MCV: 92.3 fl (ref 78.0–100.0)
MONOS PCT: 7.5 % (ref 3.0–12.0)
Monocytes Absolute: 0.8 10*3/uL (ref 0.1–1.0)
NEUTROS PCT: 40.1 % — AB (ref 43.0–77.0)
Neutro Abs: 4.2 10*3/uL (ref 1.4–7.7)
Platelets: 322 10*3/uL (ref 150.0–400.0)
RBC: 4.85 Mil/uL (ref 3.87–5.11)
RDW: 12.9 % (ref 11.5–15.5)
WBC: 10.4 10*3/uL (ref 4.0–10.5)

## 2014-11-21 LAB — TSH: TSH: 1.45 u[IU]/mL (ref 0.35–4.50)

## 2014-11-21 LAB — LIPID PANEL
CHOL/HDL RATIO: 2
Cholesterol: 154 mg/dL (ref 0–200)
HDL: 81.3 mg/dL (ref >39.00)
LDL CALC: 57 mg/dL (ref 0–99)
NONHDL: 72.95
TRIGLYCERIDES: 81 mg/dL (ref 0.0–149.0)
VLDL: 16.2 mg/dL (ref 0.0–40.0)

## 2014-11-23 ENCOUNTER — Ambulatory Visit (INDEPENDENT_AMBULATORY_CARE_PROVIDER_SITE_OTHER): Payer: 59 | Admitting: Family Medicine

## 2014-11-23 ENCOUNTER — Encounter: Payer: Self-pay | Admitting: Family Medicine

## 2014-11-23 VITALS — BP 131/85 | HR 80 | Temp 98.2°F | Resp 16 | Ht 64.0 in | Wt 172.0 lb

## 2014-11-23 DIAGNOSIS — Z0000 Encounter for general adult medical examination without abnormal findings: Secondary | ICD-10-CM

## 2014-11-23 DIAGNOSIS — J209 Acute bronchitis, unspecified: Secondary | ICD-10-CM | POA: Diagnosis not present

## 2014-11-23 DIAGNOSIS — Z1211 Encounter for screening for malignant neoplasm of colon: Secondary | ICD-10-CM

## 2014-11-23 DIAGNOSIS — J040 Acute laryngitis: Secondary | ICD-10-CM | POA: Diagnosis not present

## 2014-11-23 MED ORDER — PREDNISONE 20 MG PO TABS: ORAL_TABLET | ORAL | Status: DC

## 2014-11-23 NOTE — Patient Instructions (Signed)
Take generic otc robitussin DM or Mucinex DM for cough--follow dosing instructions on packaging.

## 2014-11-23 NOTE — Progress Notes (Signed)
Pre visit review using our clinic review tool, if applicable. No additional management support is needed unless otherwise documented below in the visit note. 

## 2014-11-23 NOTE — Progress Notes (Signed)
Office Note 11/23/2014  CC:  Chief Complaint  Patient presents with  . Annual Exam    Labs done on 11/21/14  . Laryngitis    not feeling any better    HPI:  Kristin Day is a 49 y.o. White female who is here for annual CPE.   Pap is UTD: last was 3 yrs ago and she had neg cytology and neg HPV testing.  Next pap due in 2 yrs--she has a GYN. Mammo UTD.  Lost voice a week and a half ago, has cough as well.  No nasal congestion, no ST, no HA, no fevers.  Occ wheezing at night and coughs more when she tries to talk.  She was prescribed some prednisone about a week ago and doesn't feel like it helped much.  Cutting back on smoking, esp with laryngitis recently.  She is in contemplative stage currently. No success on wellbutrin in past.  She is afraid of chantix trial.  Has    Past Medical History  Diagnosis Date  . Hypertension   . Overweight(278.02) 11/11/2011    Lipids excellent 10/2012  . Anxiety and depression 11/11/2011  . Menometrorrhagia 11/11/2011  . Tobacco dependence   . Perimenopausal disorder 2013    Past Surgical History  Procedure Laterality Date  . Cesarean section  1991    Family History  Problem Relation Age of Onset  . COPD Mother     smoker  . Emphysema Mother   . Hypertension Father   . Cancer Father     skin/BCC and prostate  . Cancer Maternal Grandmother     ovarian  . Emphysema Maternal Grandfather     ?  Marland Kitchen Heart disease Paternal Grandmother   . Heart disease Paternal Grandfather   . Hypertension Paternal Grandfather   . Diabetes Paternal Grandfather     Social History   Social History  . Marital Status: Married    Spouse Name: N/A  . Number of Children: N/A  . Years of Education: N/A   Occupational History  . Not on file.   Social History Main Topics  . Smoking status: Current Every Day Smoker -- 0.50 packs/day for 30 years    Types: Cigarettes  . Smokeless tobacco: Never Used  . Alcohol Use: Yes     Comment: 6-12 beers  weekly  . Drug Use: No  . Sexual Activity:    Partners: Male   Other Topics Concern  . Not on file   Social History Narrative   Monogamous relationship as of 08/2012.   Has one adult son.   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   +Smoker, active as of 11/2014.   +Alcohol 2 beers a day, more on weekends as of 08/2012.   No drugs.             Outpatient Prescriptions Prior to Visit  Medication Sig Dispense Refill  . benzonatate (TESSALON) 200 MG capsule Take 1 capsule (200 mg total) by mouth 2 (two) times daily as needed for cough. 20 capsule 0  . hydrochlorothiazide (HYDRODIURIL) 25 MG tablet TAKE 1 TABLET (25 MG TOTAL) BY MOUTH DAILY. 30 tablet 0  . lisinopril (PRINIVIL,ZESTRIL) 40 MG tablet Take 1 tablet (40 mg total) by mouth daily. 30 tablet 0  . predniSONE (DELTASONE) 20 MG tablet Take 2 tablets (40 mg total) by mouth daily with breakfast. (Patient not taking: Reported on 11/23/2014) 10 tablet 0   No facility-administered medications prior to visit.  No Known Allergies  ROS Review of Systems  Constitutional: Negative for fever, chills, appetite change and fatigue.  HENT: Negative for congestion, dental problem, ear pain, postnasal drip and sore throat.   Eyes: Negative for discharge, redness and visual disturbance.  Respiratory: Positive for cough and wheezing. Negative for chest tightness and shortness of breath.   Cardiovascular: Negative for chest pain, palpitations and leg swelling.  Gastrointestinal: Negative for nausea, vomiting, abdominal pain, diarrhea and blood in stool.  Genitourinary: Negative for dysuria, urgency, frequency, hematuria, flank pain and difficulty urinating.  Musculoskeletal: Negative for myalgias, back pain, joint swelling, arthralgias and neck stiffness.  Skin: Negative for pallor and rash.  Neurological: Negative for dizziness, speech difficulty, weakness and headaches.  Hematological: Negative for adenopathy. Does not  bruise/bleed easily.  Psychiatric/Behavioral: Negative for confusion and sleep disturbance. The patient is not nervous/anxious.     PE; Blood pressure 131/85, pulse 80, temperature 98.2 F (36.8 C), temperature source Oral, resp. rate 16, height 5\' 4"  (1.626 m), weight 172 lb (78.019 kg), last menstrual period 04/20/2013, SpO2 95 %. Gen: Alert, well appearing.  Patient is oriented to person, place, time, and situation. AFFECT: pleasant, lucid thought and speech. ENT: Ears: EACs clear, normal epithelium.  TMs with good light reflex and landmarks bilaterally.  Eyes: no injection, icteris, swelling, or exudate.  EOMI, PERRLA. Nose: no drainage or turbinate edema/swelling.  No injection or focal lesion.  Mouth: lips without lesion/swelling.  Oral mucosa pink and moist.  Dentition intact and without obvious caries or gingival swelling.  Oropharynx without erythema, exudate, or swelling.  Neck: supple/nontender.  No LAD, mass, or TM.  Carotid pulses 2+ bilaterally, without bruits. CV: RRR, no m/r/g.   LUNGS: CTA bilat on insp except occ rhonchi that clear with coughing.  She also has some diffuse coarse exp wheeze that is followed by lots of post-exhalation coughing, nonlabored resps, good aeration in all lung fields. ABD: soft, NT, ND, BS normal.  No hepatospenomegaly or mass.  No bruits. EXT: no clubbing, cyanosis, or edema.  Musculoskeletal: no joint swelling, erythema, warmth, or tenderness.  ROM of all joints intact. Skin - no sores or suspicious lesions or rashes or color changes   Pertinent labs:  Lab Results  Component Value Date   TSH 1.45 11/21/2014   Lab Results  Component Value Date   WBC 10.4 11/21/2014   HGB 14.9 11/21/2014   HCT 44.8 11/21/2014   MCV 92.3 11/21/2014   PLT 322.0 11/21/2014   Lab Results  Component Value Date   CREATININE 0.78 11/21/2014   BUN 14 11/21/2014   NA 136 11/21/2014   K 4.1 11/21/2014   CL 97 11/21/2014   CO2 32 11/21/2014   Lab Results   Component Value Date   ALT 16 11/21/2014   AST 15 11/21/2014   ALKPHOS 83 11/21/2014   BILITOT 0.4 11/21/2014   Lab Results  Component Value Date   CHOL 154 11/21/2014   Lab Results  Component Value Date   HDL 81.30 11/21/2014   Lab Results  Component Value Date   LDLCALC 57 11/21/2014   Lab Results  Component Value Date   TRIG 81.0 11/21/2014   Lab Results  Component Value Date   CHOLHDL 2 11/21/2014   ASSESSMENT AND PLAN:   1) Acute bronchitis, minimal RAD component. Also has laryngitis.  She is minimally improved s/p 5d of prednisone at 40mg  qd. Will extend steroids: pred 20qd x 5d, then 10mg  qd x 6d. OTC cough  med discussed.  2) Health maintenance exam: Reviewed age and gender appropriate health maintenance issues (prudent diet, regular exercise, health risks of tobacco and excessive alcohol, use of seatbelts, fire alarms in home, use of sunscreen).  Also reviewed age and gender appropriate health screening as well as vaccine recommendations. Declined flu vaccine today. Reviewed recent [facility] labs in detail today: all normal. Referral ordered to GI today for colon cancer screening.   GYN: mammo UTD, cerv ca screening utd. Encouraged smoking cessation: she is afraid of chantix, wellbutrin did not help in the past, and she is noncommittal about nicotine replacement at this time.    An After Visit Summary was printed and given to the patient.   FOLLOW UP:  Return in about 6 months (around 05/23/2015) for routine chronic illness f/u.

## 2014-11-27 ENCOUNTER — Telehealth: Payer: Self-pay | Admitting: Family Medicine

## 2014-11-27 NOTE — Telephone Encounter (Signed)
Patient states she will only give it one more week and then if the hoarseness isn't resolved she will insist on ENT referral.  Pt will CB if not better in one week.

## 2014-11-27 NOTE — Telephone Encounter (Signed)
Reassure her that the hoarseness from a viral infection can last up to 6 weeks, so i would give it more time to completely resolve.  Call if not resolving adequately in 3-4 weeks from now and we could arrange ENT referral at that time.-thx

## 2014-11-27 NOTE — Telephone Encounter (Signed)
Patient states that she is still having hoarseness and wants to know if she should see ENT.   She states she has been seen twice for problem ( once at her CPE ) please advise.

## 2014-11-30 ENCOUNTER — Encounter: Payer: Self-pay | Admitting: Family Medicine

## 2014-11-30 ENCOUNTER — Ambulatory Visit (INDEPENDENT_AMBULATORY_CARE_PROVIDER_SITE_OTHER): Payer: 59 | Admitting: Family Medicine

## 2014-11-30 ENCOUNTER — Ambulatory Visit (INDEPENDENT_AMBULATORY_CARE_PROVIDER_SITE_OTHER)
Admission: RE | Admit: 2014-11-30 | Discharge: 2014-11-30 | Disposition: A | Discharge status: Home / Self Care | Payer: 59 | Source: Ambulatory Visit | Attending: Family Medicine | Admitting: Family Medicine

## 2014-11-30 VITALS — BP 126/86 | HR 81 | Temp 98.3°F | Resp 61 | Ht 64.0 in | Wt 172.0 lb

## 2014-11-30 DIAGNOSIS — R059 Cough, unspecified: Secondary | ICD-10-CM

## 2014-11-30 DIAGNOSIS — R091 Pleurisy: Secondary | ICD-10-CM | POA: Diagnosis not present

## 2014-11-30 DIAGNOSIS — R05 Cough: Secondary | ICD-10-CM

## 2014-11-30 DIAGNOSIS — J209 Acute bronchitis, unspecified: Secondary | ICD-10-CM

## 2014-11-30 DIAGNOSIS — F17200 Nicotine dependence, unspecified, uncomplicated: Secondary | ICD-10-CM

## 2014-11-30 DIAGNOSIS — J040 Acute laryngitis: Secondary | ICD-10-CM

## 2014-11-30 NOTE — Progress Notes (Signed)
OFFICE VISIT  11/30/2014   CC:  Chief Complaint  Patient presents with  . Chest Pain    Right side x 1 days, only happens when coughing and taking deep breaths or moving certain ways   HPI:    Patient is a 49 y.o. Caucasian female who presents for 7d f/u acute laryngitis and acute bronchitis. When I saw her 7d ago I extended her steroids (20mg  qd x 5d, then 10mg  qd x 5d), discussed use of OTC cough meds.  Hoarseness is getting a lot better. However, she had onset yesterday of pain in chest with breathing in deep and with cough and certain movements of her torso.  The pain is sharp, fleeting,  and goes from "behind R breast and into my back".  She has not been coughing excessively--not enough to have strained a chest wall muscle or cracked a rib per her report/opinion.  ROS: No fevers.  She did take ibuprofen last night.  Not having any exertional chest pain/pressure, no palpitations, no nausea, no diaphoresis.  No LE swelling. No n/v/d or abd pain.  Past Medical History  Diagnosis Date  . Hypertension   . Overweight(278.02) 11/11/2011    Lipids excellent 10/2012  . Anxiety and depression 11/11/2011  . Menometrorrhagia 11/11/2011  . Tobacco dependence   . Perimenopausal disorder 2013    Past Surgical History  Procedure Laterality Date  . Cesarean section  1991    Outpatient Prescriptions Prior to Visit  Medication Sig Dispense Refill  . hydrochlorothiazide (HYDRODIURIL) 25 MG tablet TAKE 1 TABLET (25 MG TOTAL) BY MOUTH DAILY. 30 tablet 0  . lisinopril (PRINIVIL,ZESTRIL) 40 MG tablet Take 1 tablet (40 mg total) by mouth daily. 30 tablet 0  . predniSONE (DELTASONE) 20 MG tablet 1 tab po qd x 5d, then 1/2 tab po qd x 6d 8 tablet 0  . benzonatate (TESSALON) 200 MG capsule Take 1 capsule (200 mg total) by mouth 2 (two) times daily as needed for cough. (Patient not taking: Reported on 11/30/2014) 20 capsule 0   No facility-administered medications prior to visit.    No Known  Allergies  ROS As per HPI  PE: Blood pressure 126/86, pulse 81, temperature 98.3 F (36.8 C), temperature source Oral, resp. rate 61, height 5\' 4"  (1.626 m), weight 172 lb (78.019 kg), last menstrual period 04/20/2013, SpO2 94 %.  Pt examined with Sharen Hones, CMA, as chaperone. Gen: Alert, well appearing.  Patient is oriented to person, place, time, and situation. CV: RRR, no m/r/g.   LUNGS: CTA bilat, nonlabored resps, good aeration in all lung fields. No chest wall tenderness to palpation.  LABS:  none  IMPRESSION AND PLAN:  Pleurisy.  Resolving acute bronchitis and laryngitis. Will f/u CXR that we obtained on pt earlier today--result not in yet. Finish steroids, and then start ibuprofen 600 mg bid with food until pain has resolved. Encouraged pt to continue with attempts at smoking cessation.  An After Visit Summary was printed and given to the patient.  FOLLOW UP: Return if symptoms worsen or fail to improve.   ADDENDUM: CXR showed mild bronchitis, no other abnormality that could explain her pain.

## 2014-11-30 NOTE — Patient Instructions (Signed)
When you finish your prednisone, take ibuprofen 600mg  morning and evening with food until your pain in chest is gone.

## 2014-11-30 NOTE — Progress Notes (Signed)
Pre visit review using our clinic review tool, if applicable. No additional management support is needed unless otherwise documented below in the visit note. 

## 2014-12-18 ENCOUNTER — Other Ambulatory Visit: Payer: Self-pay | Admitting: *Deleted

## 2014-12-18 MED ORDER — HYDROCHLOROTHIAZIDE 25 MG PO TABS: ORAL_TABLET | ORAL | Status: DC

## 2014-12-18 MED ORDER — LISINOPRIL 40 MG PO TABS: 40.0000 mg | ORAL_TABLET | Freq: Every day | ORAL | Status: DC

## 2014-12-18 NOTE — Telephone Encounter (Signed)
LOV: 11/23/14 NOV: None  RF request for hctz Last written: 11/17/14 #30 w/ [id number]  RF request for lisinopril Last written: 11/17/14 #30 w/ 0RF

## 2015-06-28 ENCOUNTER — Ambulatory Visit (INDEPENDENT_AMBULATORY_CARE_PROVIDER_SITE_OTHER): Payer: 59 | Admitting: Family Medicine

## 2015-06-28 ENCOUNTER — Encounter: Payer: Self-pay | Admitting: Family Medicine

## 2015-06-28 VITALS — BP 118/70 | HR 86 | Temp 98.6°F | Ht 64.0 in | Wt 171.6 lb

## 2015-06-28 DIAGNOSIS — N3090 Cystitis, unspecified without hematuria: Secondary | ICD-10-CM

## 2015-06-28 DIAGNOSIS — R350 Frequency of micturition: Secondary | ICD-10-CM | POA: Diagnosis not present

## 2015-06-28 LAB — POCT URINALYSIS DIPSTICK
Bilirubin, UA: NEGATIVE
Glucose, UA: NEGATIVE
Ketones, UA: NEGATIVE
Nitrite, UA: NEGATIVE
PH UA: 7
PROTEIN UA: NEGATIVE
RBC UA: NEGATIVE
SPEC GRAV UA: 1.015
UROBILINOGEN UA: 0.2

## 2015-06-28 MED ORDER — SULFAMETHOXAZOLE-TRIMETHOPRIM 800-160 MG PO TABS: 1.0000 | ORAL_TABLET | Freq: Two times a day (BID) | ORAL | Status: DC

## 2015-06-28 NOTE — Progress Notes (Signed)
Subjective:    Patient ID: Kristin Day, female    DOB: [birth year], 50 y.o.   MRN: [id number]  HPI  Ms. Bandy is a 50 year old female who presents today with urinary frequency for 2 days.  Associated symptoms of cloudy urine and burning at the end of urination is noted..  She denies any fever, chills, sweats, N/V/D, hematuria, vaginal burning/discharge, flank pain or suprapubic pain. No treatments have been tried at home. No aggravating or relieving factors noted.  Review of Systems  Constitutional: Negative for fever and chills.  Respiratory: Negative for cough and shortness of breath.   Cardiovascular: Negative for chest pain and palpitations.  Gastrointestinal: Negative for nausea, vomiting, abdominal pain and diarrhea.  Genitourinary: Positive for frequency. Negative for dysuria, urgency, hematuria, flank pain and vaginal discharge.   Past Medical History  Diagnosis Date  . Hypertension   . Overweight(278.02) 11/11/2011    Lipids excellent 10/2012  . Anxiety and depression 11/11/2011  . Menometrorrhagia 11/11/2011  . Tobacco dependence   . Perimenopausal disorder 2013     Social History   Social History  . Marital Status: Married    Spouse Name: N/A  . Number of Children: N/A  . Years of Education: N/A   Occupational History  . Not on file.   Social History Main Topics  . Smoking status: Current Every Day Smoker -- 0.50 packs/day for 30 years    Types: Cigarettes  . Smokeless tobacco: Never Used  . Alcohol Use: Yes     Comment: 6-12 beers weekly  . Drug Use: No  . Sexual Activity:    Partners: Male   Other Topics Concern  . Not on file   Social History Narrative   Monogamous relationship as of 08/2012.   Has one adult son.   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   +Smoker, active as of 11/2014.   +Alcohol 2 beers a day, more on weekends as of 08/2012.   No drugs.             Past Surgical History  Procedure Laterality Date  .  Cesarean section  1991    Family History  Problem Relation Age of Onset  . COPD Mother     smoker  . Emphysema Mother   . Hypertension Father   . Cancer Father     skin/BCC and prostate  . Cancer Maternal Grandmother     ovarian  . Emphysema Maternal Grandfather     ?  Marland Kitchen Heart disease Paternal Grandmother   . Heart disease Paternal Grandfather   . Hypertension Paternal Grandfather   . Diabetes Paternal Grandfather     No Known Allergies  Current Outpatient Prescriptions on File Prior to Visit  Medication Sig Dispense Refill  . hydrochlorothiazide (HYDRODIURIL) 25 MG tablet TAKE 1 TABLET (25 MG TOTAL) BY MOUTH DAILY. 30 tablet 11  . lisinopril (PRINIVIL,ZESTRIL) 40 MG tablet Take 1 tablet (40 mg total) by mouth daily. 30 tablet 11   No current facility-administered medications on file prior to visit.    BP 118/70 mmHg  Pulse 86  Temp(Src) 98.6 F (37 C) (Oral)  Ht 5\' 4"  (1.626 m)  Wt 171 lb 9 oz (77.82 kg)  BMI 29.43 kg/m2  SpO2 95%  LMP 04/20/2013     Objective:   Physical Exam  Constitutional: She is oriented to person, place, and time. She appears well-developed and well-nourished.  Cardiovascular: Normal rate, regular rhythm and  normal heart sounds.   Pulmonary/Chest: Effort normal and breath sounds normal. She has no wheezes. She has no rales.  Abdominal: Soft. Bowel sounds are normal. There is no tenderness. There is no CVA tenderness.  Negative suprapubic and CVA tenderness  Neurological: She is alert and oriented to person, place, and time.  Skin: Skin is warm and dry. No rash noted.       Assessment & Plan:  1. Cystitis Moderate leukocytes noted on urinalysis. Treatment initiated for cystitis due to symptoms and UA results. Advised patient to drink plenty of fluids and RTC if symptoms do not improve, worsen, she develops a fever >101, or flank pain.  Discussed the risks of taking a antibiotic including possibility of an allergic reaction. If she  experiences symptoms that are concerning for an allergic reaction she was advised to immediately stop medication and seek medical attention. She voiced understanding and agreed with plan.  Further advised her to follow up with her PCP if symptoms do not improve.  - sulfamethoxazole-trimethoprim (BACTRIM DS,SEPTRA DS) 800-160 MG tablet; Take 1 tablet by mouth 2 (two) times daily.  Dispense: 14 tablet; Refill: 0  2. Urinary frequency  - POCT Urinalysis Dipstick  Delano Metz, FNP-C

## 2015-06-28 NOTE — Progress Notes (Signed)
Pre visit review using our clinic review tool, if applicable. No additional management support is needed unless otherwise documented below in the visit note. 

## 2015-06-28 NOTE — Patient Instructions (Signed)
Please return to clinic if symptoms do not improve, worsen, or you develop a fever >101.  Hope you feel better soon!  Urinary Tract Infection Urinary tract infections (UTIs) can develop anywhere along your urinary tract. Your urinary tract is your body's drainage system for removing wastes and extra water. Your urinary tract includes two kidneys, two ureters, a bladder, and a urethra. Your kidneys are a pair of bean-shaped organs. Each kidney is about the size of your fist. They are located below your ribs, one on each side of your spine. CAUSES Infections are caused by microbes, which are microscopic organisms, including fungi, viruses, and bacteria. These organisms are so small that they can only be seen through a microscope. Bacteria are the microbes that most commonly cause UTIs. SYMPTOMS  Symptoms of UTIs may vary by age and gender of the patient and by the location of the infection. Symptoms in young women typically include a frequent and intense urge to urinate and a painful, burning feeling in the bladder or urethra during urination. Older women and men are more likely to be tired, shaky, and weak and have muscle aches and abdominal pain. A fever may mean the infection is in your kidneys. Other symptoms of a kidney infection include pain in your back or sides below the ribs, nausea, and vomiting. DIAGNOSIS To diagnose a UTI, your caregiver will ask you about your symptoms. Your caregiver will also ask you to provide a urine sample. The urine sample will be tested for bacteria and white blood cells. White blood cells are made by your body to help fight infection. TREATMENT  Typically, UTIs can be treated with medication. Because most UTIs are caused by a bacterial infection, they usually can be treated with the use of antibiotics. The choice of antibiotic and length of treatment depend on your symptoms and the type of bacteria causing your infection. HOME CARE INSTRUCTIONS  If you were  prescribed antibiotics, take them exactly as your caregiver instructs you. Finish the medication even if you feel better after you have only taken some of the medication.  Drink enough water and fluids to keep your urine clear or pale yellow.  Avoid caffeine, tea, and carbonated beverages. They tend to irritate your bladder.  Empty your bladder often. Avoid holding urine for long periods of time.  Empty your bladder before and after sexual intercourse.  After a bowel movement, women should cleanse from front to back. Use each tissue only once. SEEK MEDICAL CARE IF:   You have back pain.  You develop a fever.  Your symptoms do not begin to resolve within 3 days. SEEK IMMEDIATE MEDICAL CARE IF:   You have severe back pain or lower abdominal pain.  You develop chills.  You have nausea or vomiting.  You have continued burning or discomfort with urination. MAKE SURE YOU:   Understand these instructions.  Will watch your condition.  Will get help right away if you are not doing well or get worse.   This information is not intended to replace advice given to you by your health care provider. Make sure you discuss any questions you have with your health care provider.   Document Released: 10/16/2004 Document Revised: 09/27/2014 Document Reviewed: 02/14/2011 Elsevier Interactive Patient Education [employer].

## 2015-11-02 ENCOUNTER — Other Ambulatory Visit: Payer: Self-pay | Admitting: Family Medicine

## 2015-11-02 DIAGNOSIS — Z1231 Encounter for screening mammogram for malignant neoplasm of breast: Secondary | ICD-10-CM

## 2015-11-14 LAB — RESULTS CONSOLE HPV: CHL HPV: NEGATIVE

## 2015-11-14 LAB — HM PAP SMEAR

## 2015-11-16 ENCOUNTER — Ambulatory Visit
Admission: RE | Admit: 2015-11-16 | Discharge: 2015-11-16 | Disposition: A | Discharge status: Home / Self Care | Payer: 59 | Source: Ambulatory Visit | Attending: Family Medicine | Admitting: Family Medicine

## 2015-11-16 DIAGNOSIS — Z1231 Encounter for screening mammogram for malignant neoplasm of breast: Secondary | ICD-10-CM

## 2015-12-04 ENCOUNTER — Ambulatory Visit (INDEPENDENT_AMBULATORY_CARE_PROVIDER_SITE_OTHER): Payer: 59 | Admitting: Family Medicine

## 2015-12-04 ENCOUNTER — Encounter: Payer: Self-pay | Admitting: Family Medicine

## 2015-12-04 VITALS — BP 116/64 | HR 89 | Temp 98.4°F | Resp 16 | Ht 63.5 in | Wt 168.0 lb

## 2015-12-04 DIAGNOSIS — Z0000 Encounter for general adult medical examination without abnormal findings: Secondary | ICD-10-CM | POA: Diagnosis not present

## 2015-12-04 LAB — COMPREHENSIVE METABOLIC PANEL
ALK PHOS: 88 U/L (ref 39–117)
ALT: 18 U/L (ref 0–35)
AST: 20 U/L (ref 0–37)
Albumin: 4.4 g/dL (ref 3.5–5.2)
BILIRUBIN TOTAL: 0.4 mg/dL (ref 0.2–1.2)
BUN: 12 mg/dL (ref 6–23)
CO2: 28 mEq/L (ref 19–32)
Calcium: 9.4 mg/dL (ref 8.4–10.5)
Chloride: 100 mEq/L (ref 96–112)
Creatinine, Ser: 0.76 mg/dL (ref 0.40–1.20)
GFR: 85.13 mL/min (ref >60.00)
GLUCOSE: 90 mg/dL (ref 70–99)
Potassium: 4.3 mEq/L (ref 3.5–5.1)
SODIUM: 136 meq/L (ref 135–145)
TOTAL PROTEIN: 7.3 g/dL (ref 6.0–8.3)

## 2015-12-04 LAB — LIPID PANEL
CHOLESTEROL: 144 mg/dL (ref 0–200)
HDL: 71.7 mg/dL (ref >39.00)
LDL Cholesterol: 63 mg/dL (ref 0–99)
NONHDL: 71.89
Total CHOL/HDL Ratio: 2
Triglycerides: 45 mg/dL (ref 0.0–149.0)
VLDL: 9 mg/dL (ref 0.0–40.0)

## 2015-12-04 LAB — CBC WITH DIFFERENTIAL/PLATELET
BASOS ABS: 0.1 10*3/uL (ref 0.0–0.1)
Basophils Relative: 1.1 % (ref 0.0–3.0)
Eosinophils Absolute: 0 10*3/uL (ref 0.0–0.7)
Eosinophils Relative: 0.9 % (ref 0.0–5.0)
HCT: 45.2 % (ref 36.0–46.0)
Hemoglobin: 15.5 g/dL — ABNORMAL HIGH (ref 12.0–15.0)
LYMPHS ABS: 1.1 10*3/uL (ref 0.7–4.0)
Lymphocytes Relative: 22.5 % (ref 12.0–46.0)
MCHC: 34.3 g/dL (ref 30.0–36.0)
MCV: 90.9 fl (ref 78.0–100.0)
MONO ABS: 0.6 10*3/uL (ref 0.1–1.0)
MONOS PCT: 11 % (ref 3.0–12.0)
NEUTROS ABS: 3.3 10*3/uL (ref 1.4–7.7)
NEUTROS PCT: 64.5 % (ref 43.0–77.0)
PLATELETS: 258 10*3/uL (ref 150.0–400.0)
RBC: 4.97 Mil/uL (ref 3.87–5.11)
RDW: 13.1 % (ref 11.5–15.5)
WBC: 5.1 10*3/uL (ref 4.0–10.5)

## 2015-12-04 LAB — TSH: TSH: 0.93 u[IU]/mL (ref 0.35–4.50)

## 2015-12-04 NOTE — Progress Notes (Signed)
Office Note 12/04/2015  CC:  Chief Complaint  Patient presents with  . Annual Exam    CPE    HPI:  Kristin Day is a 50 y.o. White female who is here for annual health maintenance exam. Sees [facility]: pap and mammo 10/2015 normal per pt.  Has plans to do arrange colonoscopy 01/2016.  She'll call for referral.  Onset yesterday of scratchy throat yesterday, some fatigue last night, then some coughing this morning. No fever.  Mild stuffy nose this AM.  No wheezing or SOB.  Eye exam and dental UTD.    Past Medical History:  Diagnosis Date  . Anxiety and depression 11/11/2011  . Hypertension   . Menometrorrhagia 11/11/2011  . Overweight(278.02) 11/11/2011   Lipids excellent 10/2012  . Perimenopausal disorder 2013  . Tobacco dependence     Past Surgical History:  Procedure Laterality Date  . CESAREAN SECTION  1991    Family History  Problem Relation Age of Onset  . COPD Mother     smoker  . Emphysema Mother   . Hypertension Father   . Cancer Father     skin/BCC and prostate  . Cancer Maternal Grandmother     ovarian  . Emphysema Maternal Grandfather     ?  Marland Kitchen Heart disease Paternal Grandmother   . Heart disease Paternal Grandfather   . Hypertension Paternal Grandfather   . Diabetes Paternal Grandfather     Social History   Social History  . Marital status: Married    Spouse name: N/A  . Number of children: N/A  . Years of education: N/A   Occupational History  . Not on file.   Social History Main Topics  . Smoking status: Current Every Day Smoker    Packs/day: 0.50    Years: 30.00    Types: Cigarettes  . Smokeless tobacco: Never Used  . Alcohol use Yes     Comment: 6-12 beers weekly  . Drug use: No  . Sexual activity: Yes    Partners: Male   Other Topics Concern  . Not on file   Social History Narrative   Monogamous relationship as of 08/2012.   Has one adult son.   Works as a PT at [employer] in [place].   +Smoker,  active as of 11/2014.   +Alcohol 2 beers a day, more on weekends as of 08/2012.   No drugs.             Outpatient Medications Prior to Visit  Medication Sig Dispense Refill  . hydrochlorothiazide (HYDRODIURIL) 25 MG tablet TAKE 1 TABLET (25 MG TOTAL) BY MOUTH DAILY. 30 tablet 11  . lisinopril (PRINIVIL,ZESTRIL) 40 MG tablet Take 1 tablet (40 mg total) by mouth daily. 30 tablet 11  . sulfamethoxazole-trimethoprim (BACTRIM DS,SEPTRA DS) 800-160 MG tablet Take 1 tablet by mouth 2 (two) times daily. 14 tablet 0   No facility-administered medications prior to visit.     No Known Allergies  ROS--see HPI Review of Systems  Constitutional: Negative for appetite change, chills, fatigue and fever.  HENT: Negative for congestion, dental problem, ear pain and sore throat.   Eyes: Negative for discharge, redness and visual disturbance.  Respiratory: Negative for cough, chest tightness, shortness of breath and wheezing.   Cardiovascular: Negative for chest pain, palpitations and leg swelling.  Gastrointestinal: Negative for abdominal pain, blood in stool, diarrhea, nausea and vomiting.  Genitourinary: Negative for difficulty urinating, dysuria, flank pain, frequency, hematuria and urgency.  Musculoskeletal:  Negative for arthralgias, back pain, joint swelling, myalgias and neck stiffness.  Skin: Negative for pallor and rash.  Neurological: Negative for dizziness, speech difficulty, weakness and headaches.  Hematological: Negative for adenopathy. Does not bruise/bleed easily.  Psychiatric/Behavioral: Negative for confusion and sleep disturbance. The patient is not nervous/anxious.     PE; Blood pressure 116/64, pulse 89, temperature 98.4 F (36.9 C), temperature source Temporal, resp. rate 16, height 5' 3.5" (1.613 m), weight 168 lb (76.2 kg), last menstrual period 04/20/2013, SpO2 97 %. Gen: Alert, well appearing.  Patient is oriented to person, place, time, and situation. AFFECT: pleasant,  lucid thought and speech. ENT: Ears: EACs clear, normal epithelium.  TMs with good light reflex and landmarks bilaterally.  Eyes: no injection, icteris, swelling, or exudate.  EOMI, PERRLA. Nose: no drainage or turbinate edema/swelling.  No injection or focal lesion.  Mouth: lips without lesion/swelling.  Oral mucosa pink and moist.  Dentition intact and without obvious caries or gingival swelling.  Oropharynx without erythema, exudate, or swelling.  Neck: supple/nontender.  No LAD, mass, or TM.  Carotid pulses 2+ bilaterally, without bruits. CV: RRR, no m/r/g.   LUNGS: CTA bilat, nonlabored resps, good aeration in all lung fields. ABD: soft, NT, ND, BS normal.  No hepatospenomegaly or mass.  No bruits. EXT: no clubbing, cyanosis, or edema.  Musculoskeletal: no joint swelling, erythema, warmth, or tenderness.  ROM of all joints intact. Skin - no sores or suspicious lesions or rashes or color changes  Pertinent labs:  Lab Results  Component Value Date   TSH 1.45 11/21/2014   Lab Results  Component Value Date   WBC 10.4 11/21/2014   HGB 14.9 11/21/2014   HCT 44.8 11/21/2014   MCV 92.3 11/21/2014   PLT 322.0 11/21/2014   Lab Results  Component Value Date   CREATININE 0.78 11/21/2014   BUN 14 11/21/2014   NA 136 11/21/2014   K 4.1 11/21/2014   CL 97 11/21/2014   CO2 32 11/21/2014   Lab Results  Component Value Date   ALT 16 11/21/2014   AST 15 11/21/2014   ALKPHOS 83 11/21/2014   BILITOT 0.4 11/21/2014   Lab Results  Component Value Date   CHOL 154 11/21/2014   Lab Results  Component Value Date   HDL 81.30 11/21/2014   Lab Results  Component Value Date   LDLCALC 57 11/21/2014   Lab Results  Component Value Date   TRIG 81.0 11/21/2014   Lab Results  Component Value Date   CHOLHDL 2 11/21/2014    ASSESSMENT AND PLAN:   Health maintenance exam: Reviewed age and gender appropriate health maintenance issues (prudent diet, regular exercise, health risks of  tobacco and excessive alcohol, use of seatbelts, fire alarms in home, use of sunscreen).  Also reviewed age and gender appropriate health screening as well as vaccine recommendations. Fasting HP labs drawn today (she drank a diet coke this morning). She will call 01/2016 for referral to GI for colon ca screening. GYN: cervical and breast ca screening UTD. She declined flu vaccine today.  She has a URI with cough today, suspect viral etiology. Symptomatic care discussed.  An After Visit Summary was printed and given to the patient.  FOLLOW UP:  Return in about 6 months (around 06/02/2016) for routine chronic illness f/u.  Signed:  Crissie Sickles, MD           12/04/2015

## 2015-12-04 NOTE — Patient Instructions (Signed)
Get otc generic robitussin DM OR Mucinex DM and use as directed on the packaging for cough and congestion. Use otc generic saline nasal spray 2-3 times per day to irrigate/moisturize your nasal passages.   Health Maintenance, Female Introduction Adopting a healthy lifestyle and getting preventive care can go a long way to promote health and wellness. Talk with your health care provider about what schedule of regular examinations is right for you. This is a good chance for you to check in with your provider about disease prevention and staying healthy. In between checkups, there are plenty of things you can do on your own. Experts have done a lot of research about which lifestyle changes and preventive measures are most likely to keep you healthy. Ask your health care provider for more information. Weight and diet Eat a healthy diet  Be sure to include plenty of vegetables, fruits, low-fat dairy products, and lean protein.  Do not eat a lot of foods high in solid fats, added sugars, or salt.  Get regular exercise. This is one of the most important things you can do for your health.  Most adults should exercise for at least 150 minutes each week. The exercise should increase your heart rate and make you sweat (moderate-intensity exercise).  Most adults should also do strengthening exercises at least twice a week. This is in addition to the moderate-intensity exercise. Maintain a healthy weight  Body mass index (BMI) is a measurement that can be used to identify possible weight problems. It estimates body fat based on height and weight. Your health care provider can help determine your BMI and help you achieve or maintain a healthy weight.  For females 35 years of age and older:  A BMI below 18.5 is considered underweight.  A BMI of 18.5 to 24.9 is normal.  A BMI of 25 to 29.9 is considered overweight.  A BMI of 30 and above is considered obese. Watch levels of cholesterol and blood  lipids  You should start having your blood tested for lipids and cholesterol at 51 years of age, then have this test every 5 years.  You may need to have your cholesterol levels checked more often if:  Your lipid or cholesterol levels are high.  You are older than 50 years of age.  You are at high risk for heart disease. Cancer screening Lung Cancer  Lung cancer screening is recommended for adults 59-60 years old who are at high risk for lung cancer because of a history of smoking.  A yearly low-dose CT scan of the lungs is recommended for people who:  Currently smoke.  Have quit within the past 15 years.  Have at least a 30-pack-year history of smoking. A pack year is smoking an average of one pack of cigarettes a day for 1 year.  Yearly screening should continue until it has been 15 years since you quit.  Yearly screening should stop if you develop a health problem that would prevent you from having lung cancer treatment. Breast Cancer  Practice breast self-awareness. This means understanding how your breasts normally appear and feel.  It also means doing regular breast self-exams. Let your health care provider know about any changes, no matter how small.  If you are in your 20s or 30s, you should have a clinical breast exam (CBE) by a health care provider every 1-3 years as part of a regular health exam.  If you are 61 or older, have a CBE every year. Also consider  having a breast X-ray (mammogram) every year.  If you have a family history of breast cancer, talk to your health care provider about genetic screening.  If you are at high risk for breast cancer, talk to your health care provider about having an MRI and a mammogram every year.  Breast cancer gene (BRCA) assessment is recommended for women who have family members with BRCA-related cancers. BRCA-related cancers include:  Breast.  Ovarian.  Tubal.  Peritoneal cancers.  Results of the assessment will  determine the need for genetic counseling and BRCA1 and BRCA2 testing. Cervical Cancer  Your health care provider may recommend that you be screened regularly for cancer of the pelvic organs (ovaries, uterus, and vagina). This screening involves a pelvic examination, including checking for microscopic changes to the surface of your cervix (Pap test). You may be encouraged to have this screening done every 3 years, beginning at age 35.  For women ages 74-65, health care providers may recommend pelvic exams and Pap testing every 3 years, or they may recommend the Pap and pelvic exam, combined with testing for human papilloma virus (HPV), every 5 years. Some types of HPV increase your risk of cervical cancer. Testing for HPV may also be done on women of any age with unclear Pap test results.  Other health care providers may not recommend any screening for nonpregnant women who are considered low risk for pelvic cancer and who do not have symptoms. Ask your health care provider if a screening pelvic exam is right for you.  If you have had past treatment for cervical cancer or a condition that could lead to cancer, you need Pap tests and screening for cancer for at least 20 years after your treatment. If Pap tests have been discontinued, your risk factors (such as having a new sexual partner) need to be reassessed to determine if screening should resume. Some women have medical problems that increase the chance of getting cervical cancer. In these cases, your health care provider may recommend more frequent screening and Pap tests. Colorectal Cancer  This type of cancer can be detected and often prevented.  Routine colorectal cancer screening usually begins at 51 years of age and continues through 51 years of age.  Your health care provider may recommend screening at an earlier age if you have risk factors for colon cancer.  Your health care provider may also recommend using home test kits to check for  hidden blood in the stool.  A small camera at the end of a tube can be used to examine your colon directly (sigmoidoscopy or colonoscopy). This is done to check for the earliest forms of colorectal cancer.  Routine screening usually begins at age 70.  Direct examination of the colon should be repeated every 5-10 years through 51 years of age. However, you may need to be screened more often if early forms of precancerous polyps or small growths are found. Skin Cancer  Check your skin from head to toe regularly.  Tell your health care provider about any new moles or changes in moles, especially if there is a change in a mole's shape or color.  Also tell your health care provider if you have a mole that is larger than the size of a pencil eraser.  Always use sunscreen. Apply sunscreen liberally and repeatedly throughout the day.  Protect yourself by wearing long sleeves, pants, a wide-brimmed hat, and sunglasses whenever you are outside. Heart disease, diabetes, and high blood pressure  High blood  pressure causes heart disease and increases the risk of stroke. High blood pressure is more likely to develop in:  People who have blood pressure in the high end of the normal range (130-139/85-89 mm Hg).  People who are overweight or obese.  People who are African American.  If you are 52-14 years of age, have your blood pressure checked every 3-5 years. If you are 44 years of age or older, have your blood pressure checked every year. You should have your blood pressure measured twice-once when you are at a hospital or clinic, and once when you are not at a hospital or clinic. Record the average of the two measurements. To check your blood pressure when you are not at a hospital or clinic, you can use:  An automated blood pressure machine at a pharmacy.  A home blood pressure monitor.  If you are between 85 years and 70 years old, ask your health care provider if you should take aspirin to  prevent strokes.  Have regular diabetes screenings. This involves taking a blood sample to check your fasting blood sugar level.  If you are at a normal weight and have a low risk for diabetes, have this test once every three years after 51 years of age.  If you are overweight and have a high risk for diabetes, consider being tested at a younger age or more often. Preventing infection Hepatitis B  If you have a higher risk for hepatitis B, you should be screened for this virus. You are considered at high risk for hepatitis B if:  You were born in a country where hepatitis B is common. Ask your health care provider which countries are considered high risk.  Your parents were born in a high-risk country, and you have not been immunized against hepatitis B (hepatitis B vaccine).  You have HIV or AIDS.  You use needles to inject street drugs.  You live with someone who has hepatitis B.  You have had sex with someone who has hepatitis B.  You get hemodialysis treatment.  You take certain medicines for conditions, including cancer, organ transplantation, and autoimmune conditions. Hepatitis C  Blood testing is recommended for:  Everyone born from [birth year] through 1966.  Anyone with known risk factors for hepatitis C. Sexually transmitted infections (STIs)  You should be screened for sexually transmitted infections (STIs) including gonorrhea and chlamydia if:  You are sexually active and are younger than 51 years of age.  You are older than 51 years of age and your health care provider tells you that you are at risk for this type of infection.  Your sexual activity has changed since you were last screened and you are at an increased risk for chlamydia or gonorrhea. Ask your health care provider if you are at risk.  If you do not have HIV, but are at risk, it may be recommended that you take a prescription medicine daily to prevent HIV infection. This is called pre-exposure  prophylaxis (PrEP). You are considered at risk if:  You are sexually active and do not regularly use condoms or know the HIV status of your partner(s).  You take drugs by injection.  You are sexually active with a partner who has HIV. Talk with your health care provider about whether you are at high risk of being infected with HIV. If you choose to begin PrEP, you should first be tested for HIV. You should then be tested every 3 months for as long as you  are taking PrEP. Pregnancy  If you are premenopausal and you may become pregnant, ask your health care provider about preconception counseling.  If you may become pregnant, take 400 to 800 micrograms (mcg) of folic acid every day.  If you want to prevent pregnancy, talk to your health care provider about birth control (contraception). Osteoporosis and menopause  Osteoporosis is a disease in which the bones lose minerals and strength with aging. This can result in serious bone fractures. Your risk for osteoporosis can be identified using a bone density scan.  If you are 25 years of age or older, or if you are at risk for osteoporosis and fractures, ask your health care provider if you should be screened.  Ask your health care provider whether you should take a calcium or vitamin D supplement to lower your risk for osteoporosis.  Menopause may have certain physical symptoms and risks.  Hormone replacement therapy may reduce some of these symptoms and risks. Talk to your health care provider about whether hormone replacement therapy is right for you. Follow these instructions at home:  Schedule regular health, dental, and eye exams.  Stay current with your immunizations.  Do not use any tobacco products including cigarettes, chewing tobacco, or electronic cigarettes.  If you are pregnant, do not drink alcohol.  If you are breastfeeding, limit how much and how often you drink alcohol.  Limit alcohol intake to no more than 1 drink  per day for nonpregnant women. One drink equals 12 ounces of beer, 5 ounces of wine, or 1 ounces of hard liquor.  Do not use street drugs.  Do not share needles.  Ask your health care provider for help if you need support or information about quitting drugs.  Tell your health care provider if you often feel depressed.  Tell your health care provider if you have ever been abused or do not feel safe at home. This information is not intended to replace advice given to you by your health care provider. Make sure you discuss any questions you have with your health care provider. Document Released: 07/22/2010 Document Revised: 06/14/2015 Document Reviewed: 10/10/2014  2017 [employer]vier

## 2015-12-21 ENCOUNTER — Other Ambulatory Visit: Payer: Self-pay | Admitting: *Deleted

## 2015-12-21 MED ORDER — LISINOPRIL 40 MG PO TABS: 40.0000 mg | ORAL_TABLET | Freq: Every day | ORAL | 11 refills | Status: DC

## 2015-12-21 MED ORDER — HYDROCHLOROTHIAZIDE 25 MG PO TABS: ORAL_TABLET | ORAL | 11 refills | Status: DC

## 2015-12-21 NOTE — Telephone Encounter (Signed)
RF request for lisinopril LOV: 12/04/15 Next ov: None Last written: 12/18/14 #30 w/ 11RF  RF request for hctz Last written: 12/18/14 #30 w/ 11RF

## 2016-01-02 ENCOUNTER — Other Ambulatory Visit: Payer: Self-pay | Admitting: Family Medicine

## 2016-01-02 ENCOUNTER — Encounter: Payer: Self-pay | Admitting: Family Medicine

## 2016-01-02 ENCOUNTER — Telehealth: Payer: Self-pay | Admitting: Family Medicine

## 2016-01-02 MED ORDER — VENLAFAXINE HCL ER 37.5 MG PO CP24: ORAL_CAPSULE | ORAL | 0 refills | Status: DC

## 2016-01-02 NOTE — Telephone Encounter (Signed)
I'll eRx generic effexor. Pls notify pt and make sure she knows to have a f/u appt with me in 4 weeks to see how she's doing on this med.-thx

## 2016-01-02 NOTE — Telephone Encounter (Signed)
Noted  

## 2016-01-02 NOTE — Telephone Encounter (Signed)
FYI

## 2016-01-02 NOTE — Telephone Encounter (Signed)
Patient saw pschycologist Oneida Arenas at [facility]. Patient states their office will either contact ours or send OV notes regarding medications that he is recommending her to take. She does not know the name of the medication. Patient is okay with making an appointment to discuss Dr. Anitra Lauth managing the medications.

## 2016-01-02 NOTE — Telephone Encounter (Signed)
Left detailed message on cell vm, okay per DPR.  

## 2016-01-02 NOTE — Telephone Encounter (Signed)
Dr. Oneida Arenas called stating that he saw pt and recommends that she be put on an antidepressant. He stated that she has took Zoloft in the past and did really well but he feels that her depression needs something along the lines of Effexor or pristique. He stated that he will fax over his note from today's visit and if you need to call him his number is [phone].

## 2016-01-29 ENCOUNTER — Ambulatory Visit (INDEPENDENT_AMBULATORY_CARE_PROVIDER_SITE_OTHER): Payer: 59 | Admitting: Family Medicine

## 2016-01-29 ENCOUNTER — Encounter: Payer: Self-pay | Admitting: Family Medicine

## 2016-01-29 VITALS — BP 120/72 | HR 87 | Temp 98.5°F | Resp 16 | Ht 63.5 in | Wt 163.8 lb

## 2016-01-29 DIAGNOSIS — F418 Other specified anxiety disorders: Secondary | ICD-10-CM | POA: Diagnosis not present

## 2016-01-29 DIAGNOSIS — F419 Anxiety disorder, unspecified: Principal | ICD-10-CM

## 2016-01-29 DIAGNOSIS — F32A Depression, unspecified: Secondary | ICD-10-CM

## 2016-01-29 DIAGNOSIS — F329 Major depressive disorder, single episode, unspecified: Secondary | ICD-10-CM

## 2016-01-29 MED ORDER — VENLAFAXINE HCL ER 75 MG PO CP24: 75.0000 mg | ORAL_CAPSULE | Freq: Every day | ORAL | 1 refills | Status: DC

## 2016-01-29 NOTE — Progress Notes (Signed)
Pre visit review using our clinic review tool, if applicable. No additional management support is needed unless otherwise documented below in the visit note. 

## 2016-01-29 NOTE — Progress Notes (Signed)
OFFICE VISIT  01/29/2016   CC:  Chief Complaint  Patient presents with  . Follow-up    RCI, pt is not fasting.   HPI:    Patient is a 51 y.o. Caucasian female who presents for 1 month f/u for depression. I started her on venlafaxine XR 37.5mg  qd after she had started seeing pschycologist Oneida Arenas at [facility]. Instructions were given to titrate to 75mg  qd after 1 week.  She feels much improved, esp regarding her anxiety.  No side effects now but had some fidgety feeling initially. Mood improved.  Sleep improved.  Appetite down some, pt says she is trying to lose wt. Wants to stay at 75mg  qd dosing for now.  She is still seeing Oneida Arenas for counseling--this is a longstanding relationship.  Past Medical History:  Diagnosis Date  . Anxiety and depression 11/11/2011   Started counseling 12/2015, counselor recommended effexor so I started it 01/02/16.  Marland Kitchen Hypertension   . Menometrorrhagia 11/11/2011  . Overweight(278.02) 11/11/2011   Lipids excellent 10/2012  . Perimenopausal disorder 2013  . Tobacco dependence     Past Surgical History:  Procedure Laterality Date  . [place]    Outpatient Medications Prior to Visit  Medication Sig Dispense Refill  . hydrochlorothiazide (HYDRODIURIL) 25 MG tablet TAKE 1 TABLET (25 MG TOTAL) BY MOUTH DAILY. 30 tablet 11  . lisinopril (PRINIVIL,ZESTRIL) 40 MG tablet Take 1 tablet (40 mg total) by mouth daily. 30 tablet 11  . venlafaxine XR (EFFEXOR-XR) 37.5 MG 24 hr capsule 1 cap po qd x 7d, then increase to 2 caps po qd (Patient taking differently: Take 75 mg by mouth daily. ) 53 capsule 0  . hydrocortisone-pramoxine (ANALPRAM-HC) 2.5-1 % rectal cream      No facility-administered medications prior to visit.     No Known Allergies  ROS As per HPI  PE: Blood pressure 120/72, pulse 87, temperature 98.5 F (36.9 C), temperature source Oral, resp. rate 16, height 5' 3.5" (1.613 m), weight 163 lb 12 oz (74.3  kg), last menstrual period 04/20/2013, SpO2 96 %. Wt Readings from Last 2 Encounters:  01/29/16 163 lb 12 oz (74.3 kg)  12/04/15 168 lb (76.2 kg)    Gen: alert, oriented x 4, affect pleasant.  Lucid thinking and conversation noted. HEENT: PERRLA, EOMI.   Neck: no LAD, mass, or thyromegaly. CV: RRR, no m/r/g LUNGS: CTA bilat, nonlabored. NEURO: no tremor or tics noted on observation.  Coordination intact. CN 2-12 grossly intact bilaterally, strength 5/5 in all extremeties.  No ataxia.   LABS:  none  IMPRESSION AND PLAN:  MDD and GAD: much improved.  Continue venlafaxine XR 75mg  qd. Continue counseling. F/u 4-6 weeks.  An After Visit Summary was printed and given to the patient.  FOLLOW UP: Return for 4-6 week f/u anx/dep.  Signed:  Crissie Sickles, MD           01/29/2016

## 2016-02-08 ENCOUNTER — Telehealth: Payer: Self-pay | Admitting: Family Medicine

## 2016-02-08 MED ORDER — OSELTAMIVIR PHOSPHATE 75 MG PO CAPS: 75.0000 mg | ORAL_CAPSULE | Freq: Two times a day (BID) | ORAL | 0 refills | Status: DC

## 2016-02-08 NOTE — Telephone Encounter (Signed)
Please advise. Thanks.  

## 2016-02-08 NOTE — Telephone Encounter (Signed)
Patient has had flu like symptoms since Wednesday am.  Pt states fever, body aches, headaches.  Fever has progressively gotten higher around 102.0.  Today it is 101.3.  She wants to know if there is anything else she can do besides advil and cold and flu medications, and lots of fluids?  Please advise.

## 2016-02-08 NOTE — Telephone Encounter (Signed)
Pt advised and voiced understanding.  Rx sent as directed below.

## 2016-02-08 NOTE — Telephone Encounter (Signed)
Offer tamiflu (antiviral med against the flu virus). If pt wants this, pls eRx generic tamiflu 75 mg tabs, 1 tab po bid x 5d, #10, no RF.-thx

## 2016-03-07 ENCOUNTER — Encounter: Payer: Self-pay | Admitting: Family Medicine

## 2016-03-07 ENCOUNTER — Ambulatory Visit (INDEPENDENT_AMBULATORY_CARE_PROVIDER_SITE_OTHER): Payer: 59 | Admitting: Family Medicine

## 2016-03-07 VITALS — BP 140/72 | HR 82 | Temp 97.9°F | Resp 16 | Wt 161.0 lb

## 2016-03-07 DIAGNOSIS — F32A Depression, unspecified: Secondary | ICD-10-CM

## 2016-03-07 DIAGNOSIS — F418 Other specified anxiety disorders: Secondary | ICD-10-CM

## 2016-03-07 DIAGNOSIS — F419 Anxiety disorder, unspecified: Principal | ICD-10-CM

## 2016-03-07 DIAGNOSIS — F329 Major depressive disorder, single episode, unspecified: Secondary | ICD-10-CM

## 2016-03-07 MED ORDER — VENLAFAXINE HCL ER 75 MG PO CP24: 75.0000 mg | ORAL_CAPSULE | Freq: Every day | ORAL | 1 refills | Status: DC

## 2016-03-07 NOTE — Progress Notes (Signed)
OFFICE VISIT  03/07/2016   CC:  Chief Complaint  Patient presents with  . Depression    follow up   HPI:    Patient is a 51 y.o. Caucasian female who presents for 1 mo f/u anxiety and depression. We kept her on effexor xr 75mg  qd at last visit. She has ongoing counseling with her psychologist. She is going well: mood and anxiety stable.  Energy level good, sleep good.  Appetite good. No side effects from effexor.  We recently called in tamiflu for her having an influenza-like illness: she had fever, body aches, cough. She only tolerated 2 d of tamiflu b/c of severe nausea from the med.  She feels completely well now.  Past Medical History:  Diagnosis Date  . Anxiety and depression 11/11/2011   Started counseling 12/2015, counselor recommended effexor so I started it 01/02/16.  Marland Kitchen Hypertension   . Menometrorrhagia 11/11/2011  . Overweight(278.02) 11/11/2011   Lipids excellent 10/2012  . Perimenopausal disorder 2013  . Tobacco dependence     Past Surgical History:  Procedure Laterality Date  . [place]    Outpatient Medications Prior to Visit  Medication Sig Dispense Refill  . hydrochlorothiazide (HYDRODIURIL) 25 MG tablet TAKE 1 TABLET (25 MG TOTAL) BY MOUTH DAILY. 30 tablet 11  . lisinopril (PRINIVIL,ZESTRIL) 40 MG tablet Take 1 tablet (40 mg total) by mouth daily. 30 tablet 11  . venlafaxine XR (EFFEXOR-XR) 75 MG 24 hr capsule Take 1 capsule (75 mg total) by mouth daily with breakfast. 30 capsule 1  . oseltamivir (TAMIFLU) 75 MG capsule Take 1 capsule (75 mg total) by mouth 2 (two) times daily. (Patient not taking: Reported on 03/07/2016) 10 capsule 0   No facility-administered medications prior to visit.     Allergies  Allergen Reactions  . Tamiflu [Oseltamivir] Nausea Only    ROS As per HPI  PE: Blood pressure 140/72, pulse 82, temperature 97.9 F (36.6 C), temperature source Oral, resp. rate 16, weight 161 lb (73 kg), last menstrual period  10/25/2014, SpO2 96 %. Wt Readings from Last 2 Encounters:  03/07/16 161 lb (73 kg)  01/29/16 163 lb 12 oz (74.3 kg)    Gen: alert, oriented x 4, affect pleasant.  Lucid thinking and conversation noted. HEENT: PERRLA, EOMI.   Neck: no LAD, mass, or thyromegaly. CV: RRR, no m/r/g LUNGS: CTA bilat, nonlabored. NEURO: no tremor or tics noted on observation.  Coordination intact. CN 2-12 grossly intact bilaterally, strength 5/5 in all extremeties.  No ataxia.   LABS:    Chemistry      Component Value Date/Time   NA 136 12/04/2015 0851   K 4.3 12/04/2015 0851   CL 100 12/04/2015 0851   CO2 28 12/04/2015 0851   BUN 12 12/04/2015 0851   CREATININE 0.76 12/04/2015 0851      Component Value Date/Time   CALCIUM 9.4 12/04/2015 0851   ALKPHOS 88 12/04/2015 0851   AST 20 12/04/2015 0851   ALT 18 12/04/2015 0851   BILITOT 0.4 12/04/2015 0851      IMPRESSION AND PLAN:  Anxiety and depression: The current medical regimen is effective;  continue present plan and medications. Continue effexor xr 75mg  qd and counseling. She'll call if she is feeling like she wants to ween off med before our next f/u for her CPE in 9 mo. She does not like taking medication.  She understands that she can also come back in for f/u any time if she wants to  discuss change in med dosing or ween off med. RF'd effexor today.  An After Visit Summary was printed and given to the patient.  FOLLOW UP: Return in about 9 months (around 12/05/2016) for annual CPE (fasting).  Signed:  Crissie Sickles, MD           03/07/2016

## 2016-09-12 ENCOUNTER — Ambulatory Visit: Payer: 59 | Admitting: Family Medicine

## 2016-09-15 ENCOUNTER — Other Ambulatory Visit: Payer: Self-pay | Admitting: *Deleted

## 2016-09-15 MED ORDER — VENLAFAXINE HCL ER 75 MG PO CP24: 75.0000 mg | ORAL_CAPSULE | Freq: Every day | ORAL | 1 refills | Status: DC

## 2016-09-15 NOTE — Telephone Encounter (Signed)
[facility] 

## 2016-12-04 ENCOUNTER — Other Ambulatory Visit: Payer: Self-pay

## 2016-12-04 ENCOUNTER — Encounter: Payer: Self-pay | Admitting: Family Medicine

## 2016-12-04 ENCOUNTER — Ambulatory Visit (INDEPENDENT_AMBULATORY_CARE_PROVIDER_SITE_OTHER): Payer: 59 | Admitting: Family Medicine

## 2016-12-04 VITALS — BP 110/80 | HR 91 | Temp 98.1°F | Resp 16 | Ht 63.5 in | Wt 155.5 lb

## 2016-12-04 DIAGNOSIS — Z0000 Encounter for general adult medical examination without abnormal findings: Secondary | ICD-10-CM | POA: Diagnosis not present

## 2016-12-04 DIAGNOSIS — I10 Essential (primary) hypertension: Secondary | ICD-10-CM

## 2016-12-04 DIAGNOSIS — L308 Other specified dermatitis: Secondary | ICD-10-CM

## 2016-12-04 LAB — CBC WITH DIFFERENTIAL/PLATELET
BASOS PCT: 1.2 % (ref 0.0–3.0)
Basophils Absolute: 0.1 10*3/uL (ref 0.0–0.1)
EOS PCT: 0.7 % (ref 0.0–5.0)
Eosinophils Absolute: 0 10*3/uL (ref 0.0–0.7)
HCT: 48.2 % — ABNORMAL HIGH (ref 36.0–46.0)
Hemoglobin: 16 g/dL — ABNORMAL HIGH (ref 12.0–15.0)
LYMPHS ABS: 1.6 10*3/uL (ref 0.7–4.0)
Lymphocytes Relative: 27.2 % (ref 12.0–46.0)
MCHC: 33.1 g/dL (ref 30.0–36.0)
MCV: 97.1 fl (ref 78.0–100.0)
MONO ABS: 0.5 10*3/uL (ref 0.1–1.0)
MONOS PCT: 8.4 % (ref 3.0–12.0)
NEUTROS PCT: 62.5 % (ref 43.0–77.0)
Neutro Abs: 3.7 10*3/uL (ref 1.4–7.7)
Platelets: 334 10*3/uL (ref 150.0–400.0)
RBC: 4.97 Mil/uL (ref 3.87–5.11)
RDW: 13.1 % (ref 11.5–15.5)
WBC: 5.9 10*3/uL (ref 4.0–10.5)

## 2016-12-04 LAB — LIPID PANEL
Cholesterol: 170 mg/dL (ref 0–200)
HDL: 92.8 mg/dL (ref >39.00)
LDL Cholesterol: 66 mg/dL (ref 0–99)
NONHDL: 77.2
TRIGLYCERIDES: 57 mg/dL (ref 0.0–149.0)
Total CHOL/HDL Ratio: 2
VLDL: 11.4 mg/dL (ref 0.0–40.0)

## 2016-12-04 LAB — COMPREHENSIVE METABOLIC PANEL
ALK PHOS: 92 U/L (ref 39–117)
ALT: 22 U/L (ref 0–35)
AST: 23 U/L (ref 0–37)
Albumin: 4.4 g/dL (ref 3.5–5.2)
BILIRUBIN TOTAL: 0.5 mg/dL (ref 0.2–1.2)
BUN: 9 mg/dL (ref 6–23)
CALCIUM: 9.7 mg/dL (ref 8.4–10.5)
CO2: 31 meq/L (ref 19–32)
Chloride: 96 mEq/L (ref 96–112)
Creatinine, Ser: 0.75 mg/dL (ref 0.40–1.20)
GFR: 86.1 mL/min (ref >60.00)
GLUCOSE: 96 mg/dL (ref 70–99)
POTASSIUM: 4.2 meq/L (ref 3.5–5.1)
Sodium: 133 mEq/L — ABNORMAL LOW (ref 135–145)
Total Protein: 7.2 g/dL (ref 6.0–8.3)

## 2016-12-04 LAB — TSH: TSH: 1.17 u[IU]/mL (ref 0.35–4.50)

## 2016-12-04 MED ORDER — FLUTICASONE PROPIONATE 0.05 % EX CREA: TOPICAL_CREAM | Freq: Two times a day (BID) | CUTANEOUS | 1 refills | Status: DC

## 2016-12-04 NOTE — Patient Instructions (Signed)

## 2016-12-04 NOTE — Progress Notes (Signed)
Office Note 12/04/2016  CC:  Chief Complaint  Patient presents with  . Annual Exam    Pt is fasting.    HPI:  Kristin Day is a 51 y.o. White female who is here for annual health maintenance exam. Has an OB/GYN: Delight Hoh at [facility].  Past Medical History:  Diagnosis Date  . Anxiety and depression 11/11/2011   Started counseling 12/2015, counselor recommended effexor so I started it 01/02/16.  Marland Kitchen Hypertension   . Menometrorrhagia 11/11/2011  . Overweight(278.02) 11/11/2011   Lipids excellent 10/2012  . Perimenopausal disorder 2013  . Tobacco dependence     Past Surgical History:  Procedure Laterality Date  . CESAREAN SECTION  1991    Family History  Problem Relation Age of Onset  . COPD Mother        smoker  . Emphysema Mother   . Hypertension Father   . Cancer Father        skin/BCC and prostate  . Cancer Maternal Grandmother        ovarian  . Emphysema Maternal Grandfather        ?  Marland Kitchen Heart disease Paternal Grandmother   . Heart disease Paternal Grandfather   . Hypertension Paternal Grandfather   . Diabetes Paternal Grandfather     Social History   Socioeconomic History  . Marital status: Married    Spouse name: Not on file  . Number of children: Not on file  . Years of education: Not on file  . Highest education level: Not on file  Social Needs  . Financial resource strain: Not on file  . Food insecurity - worry: Not on file  . Food insecurity - inability: Not on file  . Transportation needs - medical: Not on file  . Transportation needs - non-medical: Not on file  Occupational History  . Not on file  Tobacco Use  . Smoking status: Current Every Day Smoker    Packs/day: 0.50    Years: 30.00    Pack years: 15.00    Types: Cigarettes  . Smokeless tobacco: Never Used  Substance and Sexual Activity  . Alcohol use: Yes    Comment: 6-12 beers weekly  . Drug use: No  . Sexual activity: Yes    Partners: Male  Other Topics Concern   . Not on file  Social History Narrative   Monogamous relationship as of 08/2012.   Has one adult son.   Works as a PT at [employer] in [place].   +Smoker, active as of 11/2014.   +Alcohol 2 beers a day, more on weekends as of 08/2012.   No drugs.          Outpatient Medications Prior to Visit  Medication Sig Dispense Refill  . hydrochlorothiazide (HYDRODIURIL) 25 MG tablet TAKE 1 TABLET (25 MG TOTAL) BY MOUTH DAILY. 30 tablet 11  . lisinopril (PRINIVIL,ZESTRIL) 40 MG tablet Take 1 tablet (40 mg total) by mouth daily. 30 tablet 11  . venlafaxine XR (EFFEXOR-XR) 75 MG 24 hr capsule Take 1 capsule (75 mg total) by mouth daily with breakfast. 90 capsule 1   No facility-administered medications prior to visit.     Allergies  Allergen Reactions  . Tamiflu [Oseltamivir] Nausea Only    ROS Review of Systems  Constitutional: Negative for appetite change, chills, fatigue and fever.  HENT: Negative for congestion, dental problem, ear pain and sore throat.   Eyes: Negative for discharge, redness and visual disturbance.  Respiratory: Negative  for cough, chest tightness, shortness of breath and wheezing.   Cardiovascular: Negative for chest pain, palpitations and leg swelling.  Gastrointestinal: Negative for abdominal pain, blood in stool, diarrhea, nausea and vomiting.  Genitourinary: Negative for difficulty urinating, dysuria, flank pain, frequency, hematuria and urgency.  Musculoskeletal: Negative for arthralgias, back pain, joint swelling, myalgias and neck stiffness.  Skin: Negative for pallor and rash.       A couple patches of dry and itchy skin: L leg---there "for a while".  Neurological: Negative for dizziness, speech difficulty, weakness and headaches.  Hematological: Negative for adenopathy. Does not bruise/bleed easily.  Psychiatric/Behavioral: Negative for confusion and sleep disturbance. The patient is not nervous/anxious.     PE; Blood pressure 110/80, pulse  91, temperature 98.1 F (36.7 C), temperature source Oral, resp. rate 16, height 5' 3.5" (1.613 m), weight 155 lb 8 oz (70.5 kg), last menstrual period 10/25/2014, SpO2 97 %. Gen: Alert, well appearing.  Patient is oriented to person, place, time, and situation. AFFECT: pleasant, lucid thought and speech. ENT: Ears: EACs clear, normal epithelium.  TMs with good light reflex and landmarks bilaterally.  Eyes: no injection, icteris, swelling, or exudate.  EOMI, PERRLA. Nose: no drainage or turbinate edema/swelling.  No injection or focal lesion.  Mouth: lips without lesion/swelling.  Oral mucosa pink and moist.  Dentition intact and without obvious caries or gingival swelling.  Oropharynx without erythema, exudate, or swelling.  Neck: supple/nontender.  No LAD, mass, or TM.  Carotid pulses 2+ bilaterally, without bruits. CV: RRR, no m/r/g.   LUNGS: CTA bilat, nonlabored resps, good aeration in all lung fields. ABD: soft, NT, ND, BS normal.  No hepatospenomegaly or mass.  No bruits. EXT: no clubbing, cyanosis, or edema.  Musculoskeletal: no joint swelling, erythema, warmth, or tenderness.  ROM of all joints intact. Skin - no sores or suspicious lesions or color changes. Irregular-shaped patch of superficial flaky, pinkish rash--faint.   Similar lesion on R flank.   Pertinent labs:  Lab Results  Component Value Date   TSH 0.93 12/04/2015   Lab Results  Component Value Date   WBC 5.1 12/04/2015   HGB 15.5 (H) 12/04/2015   HCT 45.2 12/04/2015   MCV 90.9 12/04/2015   PLT 258.0 12/04/2015   Lab Results  Component Value Date   CREATININE 0.76 12/04/2015   BUN 12 12/04/2015   NA 136 12/04/2015   K 4.3 12/04/2015   CL 100 12/04/2015   CO2 28 12/04/2015   Lab Results  Component Value Date   ALT 18 12/04/2015   AST 20 12/04/2015   ALKPHOS 88 12/04/2015   BILITOT 0.4 12/04/2015   Lab Results  Component Value Date   CHOL 144 12/04/2015   Lab Results  Component Value Date   HDL  71.70 12/04/2015   Lab Results  Component Value Date   LDLCALC 63 12/04/2015   Lab Results  Component Value Date   TRIG 45.0 12/04/2015   Lab Results  Component Value Date   CHOLHDL 2 12/04/2015    ASSESSMENT AND PLAN:   1) Eczema: cutivate 0.05% cream, apply bid prn.  2) Health maintenance exam: Reviewed age and gender appropriate health maintenance issues (prudent diet, regular exercise, health risks of tobacco and excessive alcohol, use of seatbelts, fire alarms in home, use of sunscreen).  Also reviewed age and gender appropriate health screening as well as vaccine recommendations. Vaccines: Tdap UTD.  Shingrix discussed: she wants to think about it.  Declines flu vaccine today.  Declines HIV screening. Labs: fasting HP today. Cerv ca screening: pt has GYN--will be making appt with her GYN to update this. Breast ca screening:will be making appt with her GYN to update this. Colon ca screening: pt has not had a screening colonoscopy.  Pt wants referral to GI --wants to get this after 01/20/17.  An After Visit Summary was printed and given to the patient.  FOLLOW UP:  Return in about 6 months (around 06/03/2017) for routine chronic illness f/u.  Signed:  Crissie Sickles, MD           12/04/2016

## 2016-12-15 ENCOUNTER — Other Ambulatory Visit: Payer: Self-pay | Admitting: Family Medicine

## 2016-12-15 DIAGNOSIS — Z1231 Encounter for screening mammogram for malignant neoplasm of breast: Secondary | ICD-10-CM

## 2017-01-06 ENCOUNTER — Encounter: Payer: Self-pay | Admitting: Gastroenterology

## 2017-01-12 ENCOUNTER — Ambulatory Visit: Payer: 59

## 2017-01-16 ENCOUNTER — Other Ambulatory Visit: Payer: Self-pay | Admitting: Family Medicine

## 2017-01-20 DIAGNOSIS — H9192 Unspecified hearing loss, left ear: Secondary | ICD-10-CM

## 2017-01-20 HISTORY — DX: Unspecified hearing loss, left ear: H91.92

## 2017-01-21 ENCOUNTER — Ambulatory Visit
Admission: RE | Admit: 2017-01-21 | Discharge: 2017-01-21 | Disposition: A | Discharge status: Home / Self Care | Payer: 59 | Source: Ambulatory Visit | Attending: Family Medicine | Admitting: Family Medicine

## 2017-01-21 DIAGNOSIS — Z1231 Encounter for screening mammogram for malignant neoplasm of breast: Secondary | ICD-10-CM

## 2017-01-21 LAB — HM MAMMOGRAPHY

## 2017-01-22 ENCOUNTER — Encounter: Payer: Self-pay | Admitting: *Deleted

## 2017-01-30 ENCOUNTER — Encounter: Payer: Self-pay | Admitting: Family Medicine

## 2017-01-30 ENCOUNTER — Ambulatory Visit: Payer: 59 | Admitting: Family Medicine

## 2017-01-30 VITALS — BP 124/77 | HR 82 | Temp 97.6°F | Wt 158.4 lb

## 2017-01-30 DIAGNOSIS — R05 Cough: Secondary | ICD-10-CM

## 2017-01-30 DIAGNOSIS — R062 Wheezing: Secondary | ICD-10-CM

## 2017-01-30 DIAGNOSIS — J4520 Mild intermittent asthma, uncomplicated: Secondary | ICD-10-CM | POA: Diagnosis not present

## 2017-01-30 DIAGNOSIS — R059 Cough, unspecified: Secondary | ICD-10-CM

## 2017-01-30 MED ORDER — IPRATROPIUM-ALBUTEROL 0.5-2.5 (3) MG/3ML IN SOLN
3.0000 mL | Freq: Once | RESPIRATORY_TRACT | Status: AC
  Administered 2017-01-30: 3 mL via RESPIRATORY_TRACT

## 2017-01-30 MED ORDER — PREDNISONE 50 MG PO TABS: 50.0000 mg | ORAL_TABLET | Freq: Every day | ORAL | 0 refills | Status: DC

## 2017-01-30 MED ORDER — AZITHROMYCIN 250 MG PO TABS: ORAL_TABLET | ORAL | 0 refills | Status: DC

## 2017-01-30 NOTE — Patient Instructions (Signed)
Rest, hydrate.  mucinex DM if cough, nettie pot or nasal saline.  prednisone prescribed, take until completed. Z-pack printed use only if worsening over the weekend.  If cough present it can last up to 6-8 weeks.  F/U 2 weeks of not improved.     Acute Bronchitis, Adult Acute bronchitis is when air tubes (bronchi) in the lungs suddenly get swollen. The condition can make it hard to breathe. It can also cause these symptoms:  A cough.  Coughing up clear, yellow, or green mucus.  Wheezing.  Chest congestion.  Shortness of breath.  A fever.  Body aches.  Chills.  A sore throat.  Follow these instructions at home: Medicines  Take over-the-counter and prescription medicines only as told by your doctor.  If you were prescribed an antibiotic medicine, take it as told by your doctor. Do not stop taking the antibiotic even if you start to feel better. General instructions  Rest.  Drink enough fluids to keep your pee (urine) clear or pale yellow.  Avoid smoking and secondhand smoke. If you smoke and you need help quitting, ask your doctor. Quitting will help your lungs heal faster.  Use an inhaler, cool mist vaporizer, or humidifier as told by your doctor.  Keep all follow-up visits as told by your doctor. This is important. How is this prevented? To lower your risk of getting this condition again:  Wash your hands often with soap and water. If you cannot use soap and water, use hand sanitizer.  Avoid contact with people who have cold symptoms.  Try not to touch your hands to your mouth, nose, or eyes.  Make sure to get the flu shot every year.  Contact a doctor if:  Your symptoms do not get better in 2 weeks. Get help right away if:  You cough up blood.  You have chest pain.  You have very bad shortness of breath.  You become dehydrated.  You faint (pass out) or keep feeling like you are going to pass out.  You keep throwing up (vomiting).  You have a  very bad headache.  Your fever or chills gets worse. This information is not intended to replace advice given to you by your health care provider. Make sure you discuss any questions you have with your health care provider. Document Released: 06/25/2007 Document Revised: 08/15/2015 Document Reviewed: 06/27/2015 Elsevier Interactive Patient Education  [employer].

## 2017-01-30 NOTE — Progress Notes (Signed)
Kristin Day , 24-Jul-1965, 52 y.o., female MRN: [id number] Patient Care Team    Relationship Specialty Notifications Start End  McGowen, Adrian Blackwater, MD PCP - General Family Medicine  02/03/12   Gustavo Lah, NP Nurse Practitioner Obstetrics and Gynecology  08/22/13     Chief Complaint  Patient presents with  . Cough    Pts c/o cough and congestion that started  a week ago, denies fever sorethroat at the moment.     Subjective: Pt presents for an OV with complaints of cough and congestion of 7 days duration.  Associated symptoms include hoarseness, nasal drainage, wheezing She denies fever, chills, nausea, vomit or diarrhea. She works in a [employer], industrial/product. She is a smoker. She has a h/o RAD. She was exposed to a sick contact. Pt has tried nyquil to ease their symptoms.   Depression screen [facility] 2/9 12/04/2016  Decreased Interest 0  Down, Depressed, Hopeless 0  PHQ - 2 Score 0  Altered sleeping 0  Tired, decreased energy 0  Change in appetite 0  Feeling bad or failure about yourself  0  Trouble concentrating 0  Moving slowly or fidgety/restless 0  Suicidal thoughts 0  PHQ-9 Score 0    Allergies  Allergen Reactions  . Tamiflu [Oseltamivir] Nausea Only   Social History   Tobacco Use  . Smoking status: Current Every Day Smoker    Packs/day: 0.50    Years: 30.00    Pack years: 15.00    Types: Cigarettes  . Smokeless tobacco: Never Used  Substance Use Topics  . Alcohol use: Yes    Comment: 6-12 beers weekly   Past Medical History:  Diagnosis Date  . Anxiety and depression 11/11/2011   Started counseling 12/2015, counselor recommended effexor so I started it 01/02/16.  Marland Kitchen Hypertension   . Menometrorrhagia 11/11/2011  . Overweight(278.02) 11/11/2011   Lipids excellent 10/2012  . Perimenopausal disorder 2013  . Tobacco dependence    Past Surgical History:  Procedure Laterality Date  . CESAREAN SECTION  1991   Family History  Problem Relation Age of Onset  . COPD  Mother        smoker  . Emphysema Mother   . Hypertension Father   . Cancer Father        skin/BCC and prostate  . Cancer Maternal Grandmother        ovarian  . Emphysema Maternal Grandfather        ?  Marland Kitchen Heart disease Paternal Grandmother   . Heart disease Paternal Grandfather   . Hypertension Paternal Grandfather   . Diabetes Paternal Grandfather    Allergies as of 01/30/2017      Reactions   Tamiflu [oseltamivir] Nausea Only      Medication List        Accurate as of 01/30/17  4:48 PM. Always use your most recent med list.          azithromycin 250 MG tablet Commonly known as:  ZITHROMAX 500 mg day1, then 250 mg QD   fluticasone 0.05 % cream Commonly known as:  CUTIVATE Apply 2 (two) times daily topically.   hydrochlorothiazide 25 MG tablet Commonly known as:  HYDRODIURIL TAKE 1 TABLET BY MOUTH EVERY DAY   lisinopril 40 MG tablet Commonly known as:  PRINIVIL,ZESTRIL TAKE 1 TABLET BY MOUTH EVERY DAY   predniSONE 50 MG tablet Commonly known as:  DELTASONE Take 1 tablet (50 mg total) by mouth daily with breakfast.   venlafaxine XR 75  MG 24 hr capsule Commonly known as:  EFFEXOR-XR Take 1 capsule (75 mg total) by mouth daily with breakfast.       All past medical history, surgical history, allergies, family history, immunizations andmedications were updated in the EMR today and reviewed under the history and medication portions of their EMR.     ROS: Negative, with the exception of above mentioned in HPI   Objective:  BP 124/77 (BP Location: Right Arm, Patient Position: Sitting, Cuff Size: Normal)   Pulse 82   Temp 97.6 F (36.4 C) (Oral)   Wt 158 lb 6.4 oz (71.8 kg)   LMP 10/25/2014   SpO2 95%   BMI 27.62 kg/m  Body mass index is 27.62 kg/m. Gen: Afebrile. No acute distress. Nontoxic in appearance, well developed, well nourished.  HENT: AT. [place]. Bilateral TM visualized with erythema or bulging. MMM, no oral lesions. Bilateral nares with erythema  and drainage. Throat without erythema or exudates. Hoarseness, cough present.  Eyes:Pupils Equal Round Reactive to light, Extraocular movements intact,  Conjunctiva without redness, discharge or icterus. Neck/lymp/endocrine: Supple,no lymphadenopathy CV: RRR Chest: bilateral diffuse wheezing is present. Normal WOB.  Neuro:  Normal gait. PERLA. EOMi. Alert. Oriented x3    No exam data present No results found. No results found for this or any previous visit (from the past 24 hour(s)).  Assessment/Plan: Kristin Day is a 52 y.o. female present for OV for  Wheezing Cough Bronchitis, asthmatic, mild intermittent, uncomplicated Responded well to duoneb tx, wheezing almost completely resolved. Pt does not desire albuterol inhaler unless absolutely needed.  Rest, hydrate.  mucinex (DM if cough), nettie pot or nasal saline.  prednsione prescribed, take until completed. Z-pack printed to use if worsening or fever over weekend.  If cough present it can last up to 6-8 weeks.  F/U 2 weeks of not improved.     Reviewed expectations re: course of current medical issues.  Discussed self-management of symptoms.  Outlined signs and symptoms indicating need for more acute intervention.  Patient verbalized understanding and all questions were answered.  Patient received an After-Visit Summary.    No orders of the defined types were placed in this encounter.    Note is dictated utilizing voice recognition software. Although note has been proof read prior to signing, occasional typographical errors still can be missed. If any questions arise, please do not hesitate to call for verification.   electronically signed by:  Howard Pouch, DO  [place]

## 2017-02-20 ENCOUNTER — Other Ambulatory Visit: Payer: Self-pay

## 2017-02-20 ENCOUNTER — Ambulatory Visit (AMBULATORY_SURGERY_CENTER): Payer: Self-pay | Admitting: *Deleted

## 2017-02-20 VITALS — Ht 63.0 in | Wt 159.0 lb

## 2017-02-20 DIAGNOSIS — Z1211 Encounter for screening for malignant neoplasm of colon: Secondary | ICD-10-CM

## 2017-02-20 MED ORDER — NA SULFATE-K SULFATE-MG SULF 17.5-3.13-1.6 GM/177ML PO SOLN
1.0000 | Freq: Once | ORAL | 0 refills | Status: AC
Start: 2017-02-20 — End: 2017-02-20

## 2017-02-20 NOTE — Progress Notes (Signed)
No egg or soy allergy known to patient  No issues with past sedation with any surgeries  or procedures, no intubation problems  No diet pills per patient No home 02 use per patient  No blood thinners per patient  Pt denies issues with constipation  No A fib or A flutter  EMMI video sent to pt's e mail  

## 2017-02-24 ENCOUNTER — Encounter: Payer: Self-pay | Admitting: Gastroenterology

## 2017-03-06 ENCOUNTER — Ambulatory Visit (AMBULATORY_SURGERY_CENTER): Payer: 59 | Admitting: Gastroenterology

## 2017-03-06 ENCOUNTER — Encounter: Payer: Self-pay | Admitting: Gastroenterology

## 2017-03-06 ENCOUNTER — Other Ambulatory Visit: Payer: Self-pay

## 2017-03-06 VITALS — BP 115/79 | HR 82 | Temp 98.0°F | Resp 13 | Ht 63.0 in | Wt 159.0 lb

## 2017-03-06 DIAGNOSIS — D123 Benign neoplasm of transverse colon: Secondary | ICD-10-CM | POA: Diagnosis not present

## 2017-03-06 DIAGNOSIS — Z1211 Encounter for screening for malignant neoplasm of colon: Secondary | ICD-10-CM | POA: Diagnosis present

## 2017-03-06 DIAGNOSIS — Z1212 Encounter for screening for malignant neoplasm of rectum: Secondary | ICD-10-CM | POA: Diagnosis not present

## 2017-03-06 HISTORY — PX: COLONOSCOPY W/ POLYPECTOMY: SHX1380

## 2017-03-06 MED ORDER — SODIUM CHLORIDE 0.9 % IV SOLN: 500.0000 mL | Freq: Once | INTRAVENOUS | Status: DC

## 2017-03-06 NOTE — Op Note (Signed)
[facility] Patient Name: Kristin Day Procedure Date: 03/06/2017 8:26 AM MRN: [id number] Endoscopist: Mallie Mussel L. Loletha Carrow , MD Age: 52 Referring MD:  Date of Birth: 1965/04/08 Gender: Female Account #: [id number] Procedure:                Colonoscopy Indications:              Screening for colorectal malignant neoplasm, This                            is the patient's first colonoscopy Medicines:                Monitored Anesthesia Care Procedure:                Pre-Anesthesia Assessment:                           - Prior to the procedure, a History and Physical                            was performed, and patient medications and                            allergies were reviewed. The patient's tolerance of                            previous anesthesia was also reviewed. The risks                            and benefits of the procedure and the sedation                            options and risks were discussed with the patient.                            All questions were answered, and informed consent                            was obtained. Prior Anticoagulants: The patient has                            taken no previous anticoagulant or antiplatelet                            agents. ASA Grade Assessment: II - A patient with                            mild systemic disease. After reviewing the risks                            and benefits, the patient was deemed in                            satisfactory condition to undergo the procedure.  After obtaining informed consent, the colonoscope                            was passed under direct vision. Throughout the                            procedure, the patient's blood pressure, pulse, and                            oxygen saturations were monitored continuously. The                            Colonoscope was introduced through the anus and                            advanced to the the cecum,  identified by                            appendiceal orifice and ileocecal valve. The                            colonoscopy was performed without difficulty. The                            patient tolerated the procedure well. The quality                            of the bowel preparation was excellent. The                            ileocecal valve, appendiceal orifice, and rectum                            were photographed. The bowel preparation used was                            SUPREP. Scope In: 9:16:43 AM Scope Out: 9:31:13 AM Scope Withdrawal Time: 0 hours 11 minutes 22 seconds  Total Procedure Duration: 0 hours 14 minutes 30 seconds  Findings:                 The perianal and digital rectal examinations were                            normal.                           A 2 mm polyp was found in the distal transverse                            colon. The polyp was sessile. The polyp was removed                            with a cold biopsy forceps. Resection and retrieval  were complete.                           Multiple small-mouthed diverticula were found in                            the sigmoid colon.                           The exam was otherwise without abnormality on                            direct and retroflexion views. Complications:            No immediate complications. Estimated Blood Loss:     Estimated blood loss was minimal. Impression:               - One 2 mm polyp in the distal transverse colon,                            removed with a cold biopsy forceps. Resected and                            retrieved.                           - Diverticulosis in the sigmoid colon.                           - The examination was otherwise normal on direct                            and retroflexion views. Recommendation:           - Patient has a contact number available for                            emergencies. The signs and symptoms  of potential                            delayed complications were discussed with the                            patient. Return to normal activities tomorrow.                            Written discharge instructions were provided to the                            patient.                           - Resume previous diet.                           - Continue present medications.                           -  Await pathology results.                           - Repeat colonoscopy is recommended for                            surveillance. The colonoscopy date will be                            determined after pathology results from today's                            exam become available for review.  L. Loletha Carrow, MD 03/06/2017 9:34:56 AM This report has been signed electronically.

## 2017-03-06 NOTE — Patient Instructions (Signed)
YOU HAD AN ENDOSCOPIC PROCEDURE TODAY AT [facility]:   Refer to the procedure report that was given to you for any specific questions about what was found during the examination.  If the procedure report does not answer your questions, please call your gastroenterologist to clarify.  If you requested that your care partner not be given the details of your procedure findings, then the procedure report has been included in a sealed envelope for you to review at your convenience later.  YOU SHOULD EXPECT: Some feelings of bloating in the abdomen. Passage of more gas than usual.  Walking can help get rid of the air that was put into your GI tract during the procedure and reduce the bloating. If you had a lower endoscopy (such as a colonoscopy or flexible sigmoidoscopy) you may notice spotting of blood in your stool or on the toilet paper. If you underwent a bowel prep for your procedure, you may not have a normal bowel movement for a few days.  Please Note:  You might notice some irritation and congestion in your nose or some drainage.  This is from the oxygen used during your procedure.  There is no need for concern and it should clear up in a day or so.  SYMPTOMS TO REPORT IMMEDIATELY:   Following lower endoscopy (colonoscopy or flexible sigmoidoscopy):  Excessive amounts of blood in the stool  Significant tenderness or worsening of abdominal pains  Swelling of the abdomen that is new, acute  Fever of 100F or higher    For urgent or emergent issues, a gastroenterologist can be reached at any hour by calling [phone].   DIET:  We do recommend a small meal at first, but then you may proceed to your regular diet.  Drink plenty of fluids but you should avoid alcoholic beverages for 24 hours.  ACTIVITY:  You should plan to take it easy for the rest of today and you should NOT DRIVE or use heavy machinery until tomorrow (because of the sedation medicines used during the test).     FOLLOW UP: Our staff will call the number listed on your records the next business day following your procedure to check on you and address any questions or concerns that you may have regarding the information given to you following your procedure. If we do not reach you, we will leave a message.  However, if you are feeling well and you are not experiencing any problems, there is no need to return our call.  We will assume that you have returned to your regular daily activities without incident.  If any biopsies were taken you will be contacted by phone or by letter within the next 1-3 weeks.  Please call us at [phone] if you have not heard about the biopsies in 3 weeks.    SIGNATURES/CONFIDENTIALITY: You and/or your care partner have signed paperwork which will be entered into your electronic medical record.  These signatures attest to the fact that that the information above on your After Visit Summary has been reviewed and is understood.  Full responsibility of the confidentiality of this discharge information lies with you and/or your care-partner.   Resume medications. Information given on diverticulosis and polyps.

## 2017-03-06 NOTE — Progress Notes (Signed)
Pt. Reports no change in her medical or surgical history since her pre-visit 02/20/2017. 

## 2017-03-06 NOTE — Progress Notes (Signed)
Called to room to assist during endoscopic procedure.  Patient ID and intended procedure confirmed with present staff. Received instructions for my participation in the procedure from the performing physician.  

## 2017-03-06 NOTE — Progress Notes (Signed)
A and O x3. Report to RN. Tolerated MAC anesthesia well.

## 2017-03-09 ENCOUNTER — Telehealth: Payer: Self-pay | Admitting: *Deleted

## 2017-03-09 NOTE — Telephone Encounter (Signed)
  Follow up Call-  Call back number 03/06/2017  Post procedure Call Back phone  # [phone]  Permission to leave phone message Yes  Some recent data might be hidden     Patient questions:  Do you have a fever, pain , or abdominal swelling? No. Pain Score  0 *  Have you tolerated food without any problems? Yes.    Have you been able to return to your normal activities? Yes.    Do you have any questions about your discharge instructions: Diet   No. Medications  No. Follow up visit  No.  Do you have questions or concerns about your Care? No.  Actions: * If pain score is 4 or above: No action needed, pain <4.

## 2017-03-10 ENCOUNTER — Encounter: Payer: Self-pay | Admitting: Family Medicine

## 2017-03-13 ENCOUNTER — Encounter: Payer: Self-pay | Admitting: Gastroenterology

## 2017-03-17 ENCOUNTER — Other Ambulatory Visit: Payer: Self-pay | Admitting: Family Medicine

## 2017-03-21 ENCOUNTER — Encounter: Payer: Self-pay | Admitting: Family Medicine

## 2017-03-23 ENCOUNTER — Ambulatory Visit: Payer: 59 | Admitting: Family Medicine

## 2017-03-23 ENCOUNTER — Encounter: Payer: Self-pay | Admitting: Family Medicine

## 2017-03-23 VITALS — BP 132/80 | HR 99 | Temp 100.2°F | Resp 18 | Ht 63.5 in | Wt 157.0 lb

## 2017-03-23 DIAGNOSIS — R6889 Other general symptoms and signs: Secondary | ICD-10-CM | POA: Diagnosis not present

## 2017-03-23 DIAGNOSIS — R69 Illness, unspecified: Secondary | ICD-10-CM | POA: Diagnosis not present

## 2017-03-23 DIAGNOSIS — J209 Acute bronchitis, unspecified: Secondary | ICD-10-CM | POA: Diagnosis not present

## 2017-03-23 DIAGNOSIS — J111 Influenza due to unidentified influenza virus with other respiratory manifestations: Secondary | ICD-10-CM

## 2017-03-23 LAB — POC INFLUENZA A&B (BINAX/QUICKVUE)
INFLUENZA A, POC: NEGATIVE
Influenza B, POC: NEGATIVE

## 2017-03-23 MED ORDER — HYDROCODONE-HOMATROPINE 5-1.5 MG/5ML PO SYRP: ORAL_SOLUTION | ORAL | 0 refills | Status: DC

## 2017-03-23 MED ORDER — PREDNISONE 20 MG PO TABS: ORAL_TABLET | ORAL | 0 refills | Status: DC

## 2017-03-23 MED ORDER — OSELTAMIVIR PHOSPHATE 75 MG PO CAPS: 75.0000 mg | ORAL_CAPSULE | Freq: Two times a day (BID) | ORAL | 0 refills | Status: DC

## 2017-03-23 NOTE — Patient Instructions (Signed)
Get otc generic robitussin DM OR Mucinex DM and use as directed on the packaging for cough and congestion. Use otc generic saline nasal spray 2-3 times per day to irrigate/moisturize your nasal passages.   

## 2017-03-23 NOTE — Progress Notes (Signed)
OFFICE VISIT  03/23/2017   CC:  Chief Complaint  Patient presents with  . Cough    shortness of breath x 1 day    HPI:    Patient is a 52 y.o. Caucasian female who presents for cough. Onset of fatigue yesterday, got worse last night with fatigue and started coughing a lot.  Dry and severe--kept her up a lot. Some mild ST from cough.  No HA.  No feeling of fever but temp is 100.2 here today.  No signif nasal congestion or PND. No achiness in body.  No wheezing, no chest tightness, no SOB. Family member had flu last week. Pt did not get flu vaccine this season.  ROS: no n/v/d or rash.  Past Medical History:  Diagnosis Date  . Anxiety and depression 11/11/2011   Started counseling 12/2015, counselor recommended effexor so I started it 01/02/16.  Marland Kitchen Hypertension   . Menometrorrhagia 11/11/2011  . Overweight(278.02) 11/11/2011   Lipids excellent 10/2012  . Perimenopausal disorder 2013  . Sigmoid diverticulosis    on colonoscopy 02/2017  . Tobacco dependence     Past Surgical History:  Procedure Laterality Date  . CESAREAN SECTION  1991  . COLONOSCOPY W/ POLYPECTOMY  03/06/2017   adenomatous polyp: recall 5 yrs    Outpatient Medications Prior to Visit  Medication Sig Dispense Refill  . hydrochlorothiazide (HYDRODIURIL) 25 MG tablet TAKE 1 TABLET BY MOUTH EVERY DAY 30 tablet 5  . lisinopril (PRINIVIL,ZESTRIL) 40 MG tablet TAKE 1 TABLET BY MOUTH EVERY DAY 30 tablet 5  . venlafaxine XR (EFFEXOR-XR) 75 MG 24 hr capsule TAKE 1 CAPSULE (75 MG TOTAL) BY MOUTH DAILY WITH BREAKFAST. 90 capsule 1  . azithromycin (ZITHROMAX) 250 MG tablet 500 mg day1, then 250 mg QD (Patient not taking: Reported on 03/06/2017) 6 tablet 0  . fluticasone (CUTIVATE) 0.05 % cream Apply 2 (two) times daily topically. (Patient not taking: Reported on 03/06/2017) 30 g 1  . predniSONE (DELTASONE) 50 MG tablet Take 1 tablet (50 mg total) by mouth daily with breakfast. (Patient not taking: Reported on 02/20/2017) 5  tablet 0   Facility-Administered Medications Prior to Visit  Medication Dose Route Frequency Provider Last Rate Last Dose  . 0.9 %  sodium chloride infusion  500 mL Intravenous Once Doran Stabler, MD        Allergies  Allergen Reactions  . Tamiflu [Oseltamivir] Nausea Only    ROS As per HPI  PE: Blood pressure 132/80, pulse 99, temperature 100.2 F (37.9 C), temperature source Oral, resp. rate 18, height 5' 3.5" (1.613 m), weight 157 lb (71.2 kg), last menstrual period 10/25/2014, SpO2 92 %. VS: noted--normal. Gen: alert, NAD, NONTOXIC APPEARING. HEENT: eyes without injection, drainage, or swelling.  Ears: EACs clear, TMs with normal light reflex and landmarks.  Nose: Clear rhinorrhea, with some dried, crusty exudate adherent to mildly injected mucosa.  No purulent d/c.  No paranasal sinus TTP.  No facial swelling.  Throat and mouth without focal lesion.  No pharyngial swelling, erythema, or exudate.   Neck: supple, no LAD.   LUNGS: CTA bilat on insp, with trace exp wheeze diffusely, good aeration, nonlabored resps.   CV: RRR, no m/r/g. EXT: no c/c/e SKIN: no rash  LABS:   Rapid flu today: NEG   Chemistry      Component Value Date/Time   NA 133 (L) 12/04/2016 0845   K 4.2 12/04/2016 0845   CL 96 12/04/2016 0845   CO2 31 12/04/2016 0845  BUN 9 12/04/2016 0845   CREATININE 0.75 12/04/2016 0845      Component Value Date/Time   CALCIUM 9.7 12/04/2016 0845   ALKPHOS 92 12/04/2016 0845   AST 23 12/04/2016 0845   ALT 22 12/04/2016 0845   BILITOT 0.5 12/04/2016 0845       IMPRESSION AND PLAN:  1) Influenza-like illness--peak of flu season, +recent exposure to person with ILI, < 48 hours since onset of sxs, NEG flu test today. I still offered trial of tamifly today and pt was in favor of this.  She had nausea last time she took tamiflu but states she was already nauseous when she tried to take it.  No current nausea. Acute bronchitis--treat with prednisone 40mg  qd x  5d. Get otc generic robitussin DM OR Mucinex DM and use as directed on the packaging for cough and congestion. Use otc generic saline nasal spray 2-3 times per day to irrigate/moisturize your nasal passages. Hycodan syrup 1-2 tsp bid prn, #120 ml.  Therapeutic expectations and side effect profile of medication discussed today.  Patient's questions answered.  An After Visit Summary was printed and given to the patient.  FOLLOW UP: Return if symptoms worsen or fail to improve.  Signed:  Crissie Sickles, MD           03/23/2017

## 2017-03-25 ENCOUNTER — Ambulatory Visit: Payer: Self-pay

## 2017-03-25 NOTE — Telephone Encounter (Signed)
Patient called in and says "I was seen in the office on the 4th and I have been taking the medicine as prescribed. I didn't take the Tamiflu today because yesterday I got nauseated and was dizzy. I feel some better, but this cough is concerning. I wonder if Dr. Anitra Lauth wants me to come in for another visit or just have a chest x-ray. Will you send him the message and have someone to call me?" I advised I would send this message to Dr. Anitra Lauth.  Reason for Disposition . [1] Caller requesting NON-URGENT health information AND [2] PCP's office is the best resource  Answer Assessment - Initial Assessment Questions 1. REASON FOR CALL or QUESTION: "What is your reason for calling today?" or "How can I best help you?" or "What question do you have that I can help answer?"     I feel like I need to have a chest x-ray  Protocols used: INFORMATION ONLY CALL-A-AH

## 2017-03-25 NOTE — Telephone Encounter (Signed)
If no fevers at this time and if no shortness of breath, and if the cough is the only concern, then reassure pt b/c the cough normally takes 10-14 days minimum to get a lot better.   If pt having any fevers in the last 2d, or if she is feeling short of breath, then I recommend she get a chest x-ray and then return to see me in the office. Let me know.-thx

## 2017-03-25 NOTE — Telephone Encounter (Addendum)
Please advise. Thanks.  

## 2017-03-26 ENCOUNTER — Other Ambulatory Visit: Payer: Self-pay | Admitting: Family Medicine

## 2017-03-26 MED ORDER — ALBUTEROL SULFATE HFA 108 (90 BASE) MCG/ACT IN AERS: 2.0000 | INHALATION_SPRAY | Freq: Four times a day (QID) | RESPIRATORY_TRACT | 0 refills | Status: DC | PRN

## 2017-03-26 NOTE — Telephone Encounter (Signed)
Reassure patient that this is still consistent with a viral respiratory infection.  She should make sure she is taking her albuterol inhaler 2-3 times a day, continue mucinex, drink lots of fluids, rest as much as possible. Symptoms typically will gradually subside over the next 5-7d. If fevers return (100.4 or more) or if getting more SOB that occurs even in the absence of a coughing spell, then she should return for recheck.--thx

## 2017-03-26 NOTE — Telephone Encounter (Signed)
Per Dr. Anitra Lauth albuterol inhaler was sent in today. Pt advised and voiced understanding.

## 2017-03-26 NOTE — Telephone Encounter (Signed)
SW pt, she stated that she has had some fever and shortness of breath. She stated that her fever broke Tuesday (03/24/17) and she gets short of breath after coughing spells. She stated that she is still taking the mucinexDM and has 2 days left of the prednisone. She states that she feels like she is on the mend but is still having some trouble coughing up phlegm and when she does get it up it is nasty. I advised pt that I would send this information back to Dr. Anitra Lauth for review.

## 2017-03-30 ENCOUNTER — Ambulatory Visit: Payer: 59 | Admitting: Family Medicine

## 2017-03-30 ENCOUNTER — Encounter: Payer: Self-pay | Admitting: Family Medicine

## 2017-03-30 VITALS — BP 120/82 | HR 98 | Temp 98.8°F | Resp 16 | Ht 63.5 in | Wt 156.0 lb

## 2017-03-30 DIAGNOSIS — N3001 Acute cystitis with hematuria: Secondary | ICD-10-CM

## 2017-03-30 DIAGNOSIS — J209 Acute bronchitis, unspecified: Secondary | ICD-10-CM | POA: Diagnosis not present

## 2017-03-30 DIAGNOSIS — R300 Dysuria: Secondary | ICD-10-CM | POA: Diagnosis not present

## 2017-03-30 LAB — POC URINALSYSI DIPSTICK (AUTOMATED)
BILIRUBIN UA: NEGATIVE
Glucose, UA: NEGATIVE
KETONES UA: NEGATIVE
PROTEIN UA: 15
Spec Grav, UA: 1.015 (ref 1.010–1.025)
Urobilinogen, UA: 0.2 E.U./dL
pH, UA: 6 (ref 5.0–8.0)

## 2017-03-30 MED ORDER — SULFAMETHOXAZOLE-TRIMETHOPRIM 800-160 MG PO TABS: 1.0000 | ORAL_TABLET | Freq: Two times a day (BID) | ORAL | 0 refills | Status: DC

## 2017-03-30 MED ORDER — PREDNISONE 20 MG PO TABS: 20.0000 mg | ORAL_TABLET | Freq: Every day | ORAL | 0 refills | Status: DC

## 2017-03-30 NOTE — Progress Notes (Signed)
OFFICE VISIT  03/30/2017   CC:  Chief Complaint  Patient presents with  . Urinary Tract Infection   HPI:    Patient is a 52 y.o. Caucasian female who presents for urinary complaints. Cough improving, more productive.  Now with pain in L infrascap region---sharp, worse w/deep breath. No SOB.  Fatigue is improving.  Yesterday started getting dysuria, pressure in suprapubic region.  No frequency or urgency.  Urine cloudy. No AZO taken.  No fevers. No blood in urine.  No abd pain, no flank pain.  Past Medical History:  Diagnosis Date  . Anxiety and depression 11/11/2011   Started counseling 12/2015, counselor recommended effexor so I started it 01/02/16.  Marland Kitchen Hypertension   . Menometrorrhagia 11/11/2011  . Overweight(278.02) 11/11/2011   Lipids excellent 10/2012  . Perimenopausal disorder 2013  . Sigmoid diverticulosis    on colonoscopy 02/2017  . Tobacco dependence     Past Surgical History:  Procedure Laterality Date  . CESAREAN SECTION  1991  . COLONOSCOPY W/ POLYPECTOMY  03/06/2017   adenomatous polyp: recall 5 yrs    Outpatient Medications Prior to Visit  Medication Sig Dispense Refill  . dextromethorphan-guaiFENesin (MUCINEX DM) 30-600 MG 12hr tablet Take 1 tablet by mouth 2 (two) times daily.    . hydrochlorothiazide (HYDRODIURIL) 25 MG tablet TAKE 1 TABLET BY MOUTH EVERY DAY 30 tablet 5  . HYDROcodone-homatropine (HYCODAN) 5-1.5 MG/5ML syrup 1-2 tsp po bid prn cough 120 mL 0  . lisinopril (PRINIVIL,ZESTRIL) 40 MG tablet TAKE 1 TABLET BY MOUTH EVERY DAY 30 tablet 5  . venlafaxine XR (EFFEXOR-XR) 75 MG 24 hr capsule TAKE 1 CAPSULE (75 MG TOTAL) BY MOUTH DAILY WITH BREAKFAST. 90 capsule 1  . albuterol (VENTOLIN HFA) 108 (90 Base) MCG/ACT inhaler Inhale 2 puffs into the lungs every 6 (six) hours as needed for wheezing or shortness of breath. (Patient not taking: Reported on 03/30/2017) 1 Inhaler 0  . azithromycin (ZITHROMAX) 250 MG tablet 500 mg day1, then 250 mg QD  (Patient not taking: Reported on 03/06/2017) 6 tablet 0  . fluticasone (CUTIVATE) 0.05 % cream Apply 2 (two) times daily topically. (Patient not taking: Reported on 03/06/2017) 30 g 1  . oseltamivir (TAMIFLU) 75 MG capsule Take 1 capsule (75 mg total) by mouth 2 (two) times daily. (Patient not taking: Reported on 03/30/2017) 10 capsule 0  . predniSONE (DELTASONE) 20 MG tablet 2 tabs po qd x 5d (Patient not taking: Reported on 03/30/2017) 10 tablet 0   Facility-Administered Medications Prior to Visit  Medication Dose Route Frequency Provider Last Rate Last Dose  . 0.9 %  sodium chloride infusion  500 mL Intravenous Once Doran Stabler, MD        Allergies  Allergen Reactions  . Tamiflu [Oseltamivir] Nausea Only    ROS As per HPI  PE: Blood pressure 120/82, pulse 98, temperature 98.8 F (37.1 C), temperature source Oral, resp. rate 16, height 5' 3.5" (1.613 m), weight 156 lb (70.8 kg), last menstrual period 10/25/2014, SpO2 94 %. VS: noted--normal. Gen: alert, NAD, NONTOXIC APPEARING. HEENT: eyes without injection, drainage, or swelling.  Ears: EACs clear, TMs with normal light reflex and landmarks.  Nose: Clear rhinorrhea, with some dried, crusty exudate adherent to mildly injected mucosa.  No purulent d/c.  No paranasal sinus TTP.  No facial swelling.  Throat and mouth without focal lesion.  No pharyngial swelling, erythema, or exudate.   Neck: supple, no LAD.   LUNGS: CTA bilat, nonlabored resps.  CV: RRR, no m/r/g. EXT: no c/c/e SKIN: no rash BACK: mild TTP in scapular region on L.  LABS:  CC UA today: 1+ blood, 15 mg/dl prot, + nitrite, 3+ leu: otherwise normal.  IMPRESSION AND PLAN:  1) Acute UTI. Bactrim DS, 1 bid x 3d. Urine sent for c/s.  2) Acute bronchitis--recent ILI.  Cough still pretty significant, question of small component of pleurisy on L mid back region, although there is also some evidence that this pain may be from muscle strain secondary to coughing. Will  give prednisone 20mg  qd x 5d.  An After Visit Summary was printed and given to the patient.  FOLLOW UP: Return if symptoms worsen or fail to improve.  Signed:  Crissie Sickles, MD           03/30/2017

## 2017-04-03 LAB — URINE CULTURE
MICRO NUMBER:: 90312605
SPECIMEN QUALITY: ADEQUATE

## 2017-05-27 ENCOUNTER — Other Ambulatory Visit: Payer: Self-pay

## 2017-05-27 MED ORDER — LISINOPRIL 40 MG PO TABS: 40.0000 mg | ORAL_TABLET | Freq: Every day | ORAL | 5 refills | Status: DC

## 2017-05-27 MED ORDER — HYDROCHLOROTHIAZIDE 25 MG PO TABS: ORAL_TABLET | ORAL | 5 refills | Status: DC

## 2017-06-01 ENCOUNTER — Ambulatory Visit: Payer: 59 | Admitting: Family Medicine

## 2017-06-01 ENCOUNTER — Encounter: Payer: Self-pay | Admitting: Family Medicine

## 2017-06-01 VITALS — BP 117/78 | HR 89 | Temp 99.1°F | Resp 16 | Ht 63.5 in | Wt 159.1 lb

## 2017-06-01 DIAGNOSIS — H6982 Other specified disorders of Eustachian tube, left ear: Secondary | ICD-10-CM | POA: Diagnosis not present

## 2017-06-01 DIAGNOSIS — H9192 Unspecified hearing loss, left ear: Secondary | ICD-10-CM | POA: Diagnosis not present

## 2017-06-01 MED ORDER — PREDNISONE 20 MG PO TABS: ORAL_TABLET | ORAL | 0 refills | Status: DC

## 2017-06-01 MED ORDER — FLUTICASONE PROPIONATE 50 MCG/ACT NA SUSP: 2.0000 | Freq: Every day | NASAL | 1 refills | Status: DC

## 2017-06-01 NOTE — Progress Notes (Signed)
OFFICE VISIT  06/01/2017   CC:  Chief Complaint  Patient presents with  . Ear Clogged    ? Left     HPI:    Patient is a 52 y.o. Caucasian female who presents for left ear feeling clogged up. Onset at least 6 wks ago when pollen count escalated.  Feels like constantly full of water. No pain, no popping now but it was doing this at the onset.  Hearing is a bit muffled, has to ask people to repeat themselves sometimes lately.  No nasal congestion or runny nose, or itching or sneezing. No meds tried.  No vertigo/dizziness. No nausea.  No ringing in ears. No recent airplane travel.  Past Medical History:  Diagnosis Date  . Anxiety and depression 11/11/2011   Started counseling 12/2015, counselor recommended effexor so I started it 01/02/16.  Marland Kitchen Hypertension   . Menometrorrhagia 11/11/2011  . Overweight(278.02) 11/11/2011   Lipids excellent 10/2012  . Perimenopausal disorder 2013  . Sigmoid diverticulosis    on colonoscopy 02/2017  . Tobacco dependence     Past Surgical History:  Procedure Laterality Date  . CESAREAN SECTION  1991  . COLONOSCOPY W/ POLYPECTOMY  03/06/2017   adenomatous polyp: recall 5 yrs    Outpatient Medications Prior to Visit  Medication Sig Dispense Refill  . hydrochlorothiazide (HYDRODIURIL) 25 MG tablet TAKE 1 TABLET BY MOUTH EVERY DAY 30 tablet 5  . lisinopril (PRINIVIL,ZESTRIL) 40 MG tablet Take 1 tablet (40 mg total) by mouth daily. 30 tablet 5  . venlafaxine XR (EFFEXOR-XR) 75 MG 24 hr capsule TAKE 1 CAPSULE (75 MG TOTAL) BY MOUTH DAILY WITH BREAKFAST. 90 capsule 1  . albuterol (VENTOLIN HFA) 108 (90 Base) MCG/ACT inhaler Inhale 2 puffs into the lungs every 6 (six) hours as needed for wheezing or shortness of breath. (Patient not taking: Reported on 03/30/2017) 1 Inhaler 0  . azithromycin (ZITHROMAX) 250 MG tablet 500 mg day1, then 250 mg QD (Patient not taking: Reported on 03/06/2017) 6 tablet 0  . dextromethorphan-guaiFENesin (MUCINEX DM) 30-600 MG  12hr tablet Take 1 tablet by mouth 2 (two) times daily.    . fluticasone (CUTIVATE) 0.05 % cream Apply 2 (two) times daily topically. (Patient not taking: Reported on 03/06/2017) 30 g 1  . HYDROcodone-homatropine (HYCODAN) 5-1.5 MG/5ML syrup 1-2 tsp po bid prn cough (Patient not taking: Reported on 06/01/2017) 120 mL 0  . oseltamivir (TAMIFLU) 75 MG capsule Take 1 capsule (75 mg total) by mouth 2 (two) times daily. (Patient not taking: Reported on 03/30/2017) 10 capsule 0  . predniSONE (DELTASONE) 20 MG tablet Take 1 tablet (20 mg total) by mouth daily with breakfast. (Patient not taking: Reported on 06/01/2017) 5 tablet 0  . sulfamethoxazole-trimethoprim (BACTRIM DS,SEPTRA DS) 800-160 MG tablet Take 1 tablet by mouth 2 (two) times daily. (Patient not taking: Reported on 06/01/2017) 6 tablet 0  . 0.9 %  sodium chloride infusion      No facility-administered medications prior to visit.     Allergies  Allergen Reactions  . Tamiflu [Oseltamivir] Nausea Only    ROS As per HPI  PE: Blood pressure 117/78, pulse 89, temperature 99.1 F (37.3 C), temperature source Oral, resp. rate 16, height 5' 3.5" (1.613 m), weight 159 lb 2 oz (72.2 kg), last menstrual period 10/25/2014, SpO2 94 %. Gen: Alert, well appearing.  Patient is oriented to person, place, time, and situation. AFFECT: pleasant, lucid thought and speech. EARS: EAC's clear, TMs with good light reflex and landmarks bilat,  R TM a bit retracted, L TM flat and will not move when pt blows forcefully against closed nostrils. HFW:YOVZ: no injection, icteris, swelling, or exudate.  EOMI, PERRLA. Mouth: lips without lesion/swelling.  Oral mucosa pink and moist. Oropharynx without erythema, exudate, or swelling.  Neck - No masses or thyromegaly or limitation in range of motion  LABS:   Audiogram today:  500 Hz    1000Hz     2000 Hz   4000 Hz L 25            30           25              35 R 25           20           20              30   IMPRESSION  AND PLAN:  Eustachian tube dysfunction, left: no signif hearing deficit on audiogram today. Discussed dx. Plan prednisone 40mg  qd x 5d. Flonase 2 sprays L nostril qd. Saline nasal spray twice a day.  An After Visit Summary was printed and given to the patient.  FOLLOW UP: Return if symptoms worsen or fail to improve in 10 days.  Signed:  Crissie Sickles, MD           06/01/2017

## 2017-06-01 NOTE — Patient Instructions (Signed)
Use over the counter saline nasal spray: 2 sprays in affected nostril twice a day to irrigate and moisturize nasal passage.

## 2017-06-08 ENCOUNTER — Other Ambulatory Visit: Payer: Self-pay | Admitting: Family Medicine

## 2017-06-08 NOTE — Telephone Encounter (Signed)
Request too early, should still have refill for 90 day supply.  Pt is due for f/u [facility], needs office visit.

## 2017-06-23 ENCOUNTER — Other Ambulatory Visit: Payer: Self-pay | Admitting: Family Medicine

## 2017-06-30 ENCOUNTER — Ambulatory Visit: Payer: 59 | Admitting: Family Medicine

## 2017-06-30 ENCOUNTER — Encounter: Payer: Self-pay | Admitting: Family Medicine

## 2017-06-30 VITALS — BP 106/72 | HR 80 | Temp 98.3°F | Resp 16 | Ht 63.5 in | Wt 159.5 lb

## 2017-06-30 DIAGNOSIS — H6502 Acute serous otitis media, left ear: Secondary | ICD-10-CM

## 2017-06-30 DIAGNOSIS — H6982 Other specified disorders of Eustachian tube, left ear: Secondary | ICD-10-CM | POA: Diagnosis not present

## 2017-06-30 DIAGNOSIS — H9012 Conductive hearing loss, unilateral, left ear, with unrestricted hearing on the contralateral side: Secondary | ICD-10-CM

## 2017-06-30 MED ORDER — AMOXICILLIN-POT CLAVULANATE 875-125 MG PO TABS: 1.0000 | ORAL_TABLET | Freq: Two times a day (BID) | ORAL | 0 refills | Status: DC

## 2017-06-30 MED ORDER — PREDNISONE 20 MG PO TABS: ORAL_TABLET | ORAL | 0 refills | Status: DC

## 2017-06-30 NOTE — Progress Notes (Signed)
OFFICE VISIT  06/30/2017   CC:  Chief Complaint  Patient presents with  . Hearing Problem    left ear   HPI:    Patient is a 52 y.o. Caucasian female who presents for left ear hearing deficit. I treated her for left sided eustachian tube dysfunction 1 mo ago (sx's for 5-6 wks at that time) : fluticasone qd + prednisone 40mg  qd x 5d. Her audiogram at that time did not show any significant hearing deficit, although she did c/o left hearing impairment at that time.  NOW: No pain, no popping.  Just signif hearing loss in L ear-->worse than at prior visit.  No ringing.  No nasal cong or runny nose lately. Prednisone last visit did help her ear feel like it was opening up, but after stopping it her ear began to feel the same. No fevers, no ST.  Past Medical History:  Diagnosis Date  . Anxiety and depression 11/11/2011   Started counseling 12/2015, counselor recommended effexor so I started it 01/02/16.  Marland Kitchen Hypertension   . Menometrorrhagia 11/11/2011  . Overweight(278.02) 11/11/2011   Lipids excellent 10/2012  . Perimenopausal disorder 2013  . Sigmoid diverticulosis    on colonoscopy 02/2017  . Tobacco dependence     Past Surgical History:  Procedure Laterality Date  . CESAREAN SECTION  1991  . COLONOSCOPY W/ POLYPECTOMY  03/06/2017   adenomatous polyp: recall 5 yrs    Outpatient Medications Prior to Visit  Medication Sig Dispense Refill  . fluticasone (FLONASE) 50 MCG/ACT nasal spray SPRAY 2 SPRAYS INTO EACH NOSTRIL EVERY DAY 16 g 1  . hydrochlorothiazide (HYDRODIURIL) 25 MG tablet TAKE 1 TABLET BY MOUTH EVERY DAY 30 tablet 5  . lisinopril (PRINIVIL,ZESTRIL) 40 MG tablet Take 1 tablet (40 mg total) by mouth daily. 30 tablet 5  . venlafaxine XR (EFFEXOR-XR) 75 MG 24 hr capsule TAKE 1 CAPSULE (75 MG TOTAL) BY MOUTH DAILY WITH BREAKFAST. 90 capsule 1  . predniSONE (DELTASONE) 20 MG tablet 2 tabs po qd x 5d (Patient not taking: Reported on 06/30/2017) 10 tablet 0   No  facility-administered medications prior to visit.     Allergies  Allergen Reactions  . Tamiflu [Oseltamivir] Nausea Only    ROS As per HPI  PE: Blood pressure 106/72, pulse 80, temperature 98.3 F (36.8 C), temperature source Oral, resp. rate 16, height 5' 3.5" (1.613 m), weight 159 lb 8 oz (72.3 kg), last menstrual period 10/25/2014, SpO2 95 %. Gen: Alert, well appearing.  Patient is oriented to person, place, time, and situation. AFFECT: pleasant, lucid thought and speech. ENT: Ears: EACs clear, normal epithelium. R TM with good light reflex and landmarks.  L TM with mild to mod bulging, very subtle diffuse injection.  TM with some dullness/cloudiness--:>question of clear fluid in middle ear.  Eyes: no injection, icteris, swelling, or exudate.  EOMI, PERRLA. Nose: no drainage or turbinate edema/swelling.  No injection or focal lesion.  Mouth: lips without lesion/swelling.  Oral mucosa pink and moist.  Dentition intact and without obvious caries or gingival swelling.  Oropharynx without erythema, exudate, or swelling.  Rinne: air cond > bone cond bilat. Weber: lateralizes to L ("bad") ear.  LABS:    Chemistry      Component Value Date/Time   NA 133 (L) 12/04/2016 0845   K 4.2 12/04/2016 0845   CL 96 12/04/2016 0845   CO2 31 12/04/2016 0845   BUN 9 12/04/2016 0845   CREATININE 0.75 12/04/2016 0845  Component Value Date/Time   CALCIUM 9.7 12/04/2016 0845   ALKPHOS 92 12/04/2016 0845   AST 23 12/04/2016 0845   ALT 22 12/04/2016 0845   BILITOT 0.5 12/04/2016 0845      IMPRESSION AND PLAN:  Left ear eustachian tube dysfunction: sx's mildly improved on steroids x 5d, then sx's returned. 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 Plan: trial of 10d of augmentin 875 bid PLUS prednisone again, this time 40mg  qd x 5d and then 20mg  qd x 5d. If not improved significantly with this, or if sx's improve a lot and then  return after finishing meds-->refer to ENT for consideration of tympanostomy tube.  OK to d/c flonase.  An After Visit Summary was printed and given to the patient.  FOLLOW UP: Return if symptoms worsen or fail to improve.  Signed:  Crissie Sickles, MD           06/30/2017

## 2017-07-13 ENCOUNTER — Telehealth: Payer: Self-pay | Admitting: Family Medicine

## 2017-07-13 NOTE — Telephone Encounter (Signed)
Noted  

## 2017-07-13 NOTE — Telephone Encounter (Signed)
Just as an fyi , pt was out of town this past weekend.  Did not take BP meds.  When she went in this morning to med office it was really high.  She was given Clonidine  and it now back in normal ranges with no symptoms.  Has been back in normal ranges since 10 this morning.

## 2017-07-13 NOTE — Telephone Encounter (Unsigned)
Copied from [place] [phone] [id number]. Topic: Quick Communication - See Telephone Encounter >> Jul 13, 2017 11:32 AM Percell Belt A wrote: CRM for notification. See Telephone encounter for: 07/13/17.   Just as an fyi , pt was out of town this past weekend.  Did not take BP meds.  When she went in this morning to med office it was really high.  She was given Clonidine  and it now back in normal ranges with no symptoms.  Has been back in normal ranges since 10 this morning.    Just FYI

## 2017-07-20 ENCOUNTER — Telehealth: Payer: Self-pay | Admitting: *Deleted

## 2017-07-20 DIAGNOSIS — H9192 Unspecified hearing loss, left ear: Secondary | ICD-10-CM

## 2017-07-20 DIAGNOSIS — H6982 Other specified disorders of Eustachian tube, left ear: Secondary | ICD-10-CM

## 2017-07-20 NOTE — Telephone Encounter (Signed)
Copied from [place] [phone] [phone]. Topic: Referral - Request >> Jul 20, 2017  8:08 AM Scherrie Gerlach wrote: Reason for CRM: pt would like a referral to an ENT.  Pt states after finishing all her med, pt's sx with her ear have returned

## 2017-07-20 NOTE — Telephone Encounter (Signed)
OK, referral ordered 

## 2017-07-20 NOTE — Telephone Encounter (Signed)
Pt advised and voiced understanding.   

## 2017-07-20 NOTE — Telephone Encounter (Signed)
Patient called and states she do have a preference of who she sees for ENT.  Dr. Arville Care at [facility]  [address]  [phone] Fax# [phone]

## 2017-07-20 NOTE — Telephone Encounter (Signed)
Pt would like a referral to an ENT.  Pt states after finishing all her med, pt's sx with her ear have returned.  Please advise. Thanks.

## 2017-07-22 ENCOUNTER — Other Ambulatory Visit: Payer: Self-pay | Admitting: Family Medicine

## 2017-08-03 DIAGNOSIS — H9191 Unspecified hearing loss, right ear: Secondary | ICD-10-CM | POA: Diagnosis not present

## 2017-08-03 DIAGNOSIS — H9012 Conductive hearing loss, unilateral, left ear, with unrestricted hearing on the contralateral side: Secondary | ICD-10-CM | POA: Diagnosis not present

## 2017-08-03 DIAGNOSIS — M26622 Arthralgia of left temporomandibular joint: Secondary | ICD-10-CM | POA: Diagnosis not present

## 2017-08-03 DIAGNOSIS — H9192 Unspecified hearing loss, left ear: Secondary | ICD-10-CM | POA: Insufficient documentation

## 2017-08-03 DIAGNOSIS — H90A12 Conductive hearing loss, unilateral, left ear with restricted hearing on the contralateral side: Secondary | ICD-10-CM | POA: Diagnosis not present

## 2017-08-03 DIAGNOSIS — H9202 Otalgia, left ear: Secondary | ICD-10-CM | POA: Diagnosis not present

## 2017-08-03 DIAGNOSIS — M26629 Arthralgia of temporomandibular joint, unspecified side: Secondary | ICD-10-CM | POA: Insufficient documentation

## 2017-08-17 ENCOUNTER — Encounter: Payer: Self-pay | Admitting: Family Medicine

## 2017-08-26 DIAGNOSIS — H90A12 Conductive hearing loss, unilateral, left ear with restricted hearing on the contralateral side: Secondary | ICD-10-CM | POA: Diagnosis not present

## 2017-08-26 DIAGNOSIS — H9191 Unspecified hearing loss, right ear: Secondary | ICD-10-CM | POA: Diagnosis not present

## 2017-09-15 DIAGNOSIS — H90A12 Conductive hearing loss, unilateral, left ear with restricted hearing on the contralateral side: Secondary | ICD-10-CM | POA: Diagnosis not present

## 2017-09-15 DIAGNOSIS — H9012 Conductive hearing loss, unilateral, left ear, with unrestricted hearing on the contralateral side: Secondary | ICD-10-CM | POA: Insufficient documentation

## 2017-09-15 DIAGNOSIS — H9191 Unspecified hearing loss, right ear: Secondary | ICD-10-CM | POA: Diagnosis not present

## 2017-10-06 ENCOUNTER — Other Ambulatory Visit: Payer: Self-pay | Admitting: Family Medicine

## 2017-10-30 ENCOUNTER — Other Ambulatory Visit: Payer: Self-pay | Admitting: Family Medicine

## 2017-11-03 NOTE — Telephone Encounter (Signed)
Left message for pt to call back or check mychart.  Okay for [facility] to advise pt and schedule apt.

## 2017-11-03 NOTE — Telephone Encounter (Signed)
Pt called back and spoke with [facility] agent Marja Kays, who advised pt and scheduled CPE for 12/10/17 at 3:00pm.

## 2017-12-07 ENCOUNTER — Other Ambulatory Visit: Payer: Self-pay | Admitting: Family Medicine

## 2017-12-10 ENCOUNTER — Encounter: Payer: 59 | Admitting: Family Medicine

## 2017-12-10 NOTE — Telephone Encounter (Signed)
Left message for pt to call back.   Okay for [facility] to advise pt and schedule apt.

## 2017-12-14 NOTE — Telephone Encounter (Signed)
Apt made for 12/30/17 at 3:00pm.

## 2017-12-14 NOTE — Telephone Encounter (Signed)
Left message for pt to call back.   Okay for [facility] to call back.

## 2017-12-29 ENCOUNTER — Encounter: Payer: Self-pay | Admitting: *Deleted

## 2017-12-30 ENCOUNTER — Ambulatory Visit (INDEPENDENT_AMBULATORY_CARE_PROVIDER_SITE_OTHER): Payer: 59 | Admitting: Family Medicine

## 2017-12-30 ENCOUNTER — Encounter: Payer: Self-pay | Admitting: Family Medicine

## 2017-12-30 VITALS — BP 132/83 | HR 79 | Temp 98.5°F | Resp 16 | Ht 63.5 in | Wt 166.0 lb

## 2017-12-30 DIAGNOSIS — Z23 Encounter for immunization: Secondary | ICD-10-CM | POA: Diagnosis not present

## 2017-12-30 DIAGNOSIS — Z0000 Encounter for general adult medical examination without abnormal findings: Secondary | ICD-10-CM

## 2017-12-30 DIAGNOSIS — I10 Essential (primary) hypertension: Secondary | ICD-10-CM | POA: Diagnosis not present

## 2017-12-30 NOTE — Patient Instructions (Signed)

## 2017-12-30 NOTE — Progress Notes (Signed)
Office Note 12/30/2017  CC:  Chief Complaint  Patient presents with  . Annual Exam    HPI:  Kristin Day is a 52 y.o. White female who is here for annual health maintenance exam. Has an OB/GYN: Delight Hoh at [facility].  Less frequent and intense exercise the last 6 mo. Diet is healthy. Dental preventive exams q52mo. Eyes: needs exam, last was 2 yra AGO.  Her hearing is not back to normal. She saw Dr. Carson Myrtle him a lot. 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  Surgery is a possibility, as is amplification-->she is currently thinking about her options.  Past Medical History:  Diagnosis Date  . Anxiety and depression 11/11/2011   Started counseling 12/2015, counselor recommended effexor so I started it 01/02/16.  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  However, unclear etiology.  ENT considering dx of SSNHL or otosclerosis.  ENT rechecking 2 wks as of 08/03/17 office note.  . Hypertension   . Menometrorrhagia 11/11/2011  . Overweight(278.02) 11/11/2011   Lipids excellent 10/2012  . Perimenopausal disorder 2013  . Sigmoid diverticulosis    on colonoscopy 02/2017  . Tobacco dependence     Past Surgical History:  Procedure Laterality Date  . CESAREAN SECTION  1991  . COLONOSCOPY W/ POLYPECTOMY  03/06/2017   adenomatous polyp: recall 5 yrs    Family History  Problem Relation Age of Onset  . COPD Mother        smoker  . Emphysema Mother   . Hypertension Father   . Cancer Father        skin/BCC and prostate  . Colon polyps Father   . Cancer Maternal Grandmother        ovarian  . Emphysema Maternal Grandfather        ?  Marland Kitchen Heart disease Paternal Grandmother   . Heart disease Paternal Grandfather   . Hypertension Paternal Grandfather   . Diabetes Paternal Grandfather   . Colon cancer Neg Hx   . Rectal cancer Neg Hx   . Stomach cancer Neg Hx   . Esophageal cancer Neg Hx     Social History   Socioeconomic History  . Marital status:  Married    Spouse name: Not on file  . Number of children: Not on file  . Years of education: Not on file  . Highest education level: Not on file  Occupational History  . Not on file  Social Needs  . Financial resource strain: Not on file  . Food insecurity:    Worry: Not on file    Inability: Not on file  . Transportation needs:    Medical: Not on file    Non-medical: Not on file  Tobacco Use  . Smoking status: Current Every Day Smoker    Packs/day: 0.50    Years: 30.00    Pack years: 15.00    Types: Cigarettes  . Smokeless tobacco: Never Used  Substance and Sexual Activity  . Alcohol use: Yes    Comment: 6-12 beers weekly  . Drug use: No  . Sexual activity: Yes    Partners: Male  Lifestyle  . Physical activity:    Days per week: Not on file    Minutes per session: Not on file  . Stress: Not on file  Relationships  . Social connections:    Talks on phone: Not on file    Gets together: Not on file    Attends religious service: Not on file  Active member of club or organization: Not on file    Attends meetings of clubs or organizations: Not on file    Relationship status: Not on file  . Intimate partner violence:    Fear of current or ex partner: Not on file    Emotionally abused: Not on file    Physically abused: Not on file    Forced sexual activity: Not on file  Other Topics Concern  . Not on file  Social History Narrative   Monogamous relationship as of 08/2012.   Has one adult son.   Works as a PT at [employer] in [place].   +Smoker, active as of 11/2014.   +Alcohol 2 beers a day, more on weekends as of 08/2012.   No drugs.          Outpatient Medications Prior to Visit  Medication Sig Dispense Refill  . hydrochlorothiazide (HYDRODIURIL) 25 MG tablet TAKE 1 TABLET BY MOUTH EVERY DAY 90 tablet 0  . lisinopril (PRINIVIL,ZESTRIL) 40 MG tablet Take 1 tablet (40 mg total) by mouth daily. OFFICE VISIT NEEDED 30 tablet 0  . venlafaxine XR  (EFFEXOR-XR) 75 MG 24 hr capsule TAKE 1 CAPSULE (75 MG TOTAL) BY MOUTH DAILY WITH BREAKFAST. 90 capsule 0  . amoxicillin-clavulanate (AUGMENTIN) 875-125 MG tablet Take 1 tablet by mouth 2 (two) times daily. (Patient not taking: Reported on 12/30/2017) 20 tablet 0  . fluticasone (FLONASE) 50 MCG/ACT nasal spray SPRAY 2 SPRAYS INTO EACH NOSTRIL EVERY DAY (Patient not taking: Reported on 12/30/2017) 16 g 1  . fluticasone (FLONASE) 50 MCG/ACT nasal spray SPRAY 2 SPRAYS INTO EACH NOSTRIL EVERY DAY (Patient not taking: Reported on 12/30/2017) 16 g 1  . predniSONE (DELTASONE) 20 MG tablet 2 tabs po qd x 5d, then 1 tab po qd x 5d (Patient not taking: Reported on 12/30/2017) 15 tablet 0   No facility-administered medications prior to visit.     Allergies  Allergen Reactions  . Tamiflu [Oseltamivir] Nausea Only    ROS Review of Systems  Constitutional: Negative for appetite change, chills, fatigue and fever.  HENT: Positive for hearing loss (chronic bilat). Negative for congestion, dental problem, ear pain and sore throat.   Eyes: Negative for discharge, redness and visual disturbance.  Respiratory: Negative for cough, chest tightness, shortness of breath and wheezing.   Cardiovascular: Negative for chest pain, palpitations and leg swelling.  Gastrointestinal: Negative for abdominal pain, blood in stool, diarrhea, nausea and vomiting.  Genitourinary: Negative for difficulty urinating, dysuria, flank pain, frequency, hematuria and urgency.  Musculoskeletal: Negative for arthralgias, back pain, joint swelling, myalgias and neck stiffness.  Skin: Negative for pallor and rash.  Neurological: Negative for dizziness, speech difficulty, weakness and headaches.  Hematological: Negative for adenopathy. Does not bruise/bleed easily.  Psychiatric/Behavioral: Negative for confusion and sleep disturbance. The patient is not nervous/anxious.     PE; Blood pressure 132/83, pulse 79, temperature 98.5 F (36.9  C), temperature source Oral, resp. rate 16, height 5' 3.5" (1.613 m), weight 166 lb (75.3 kg), last menstrual period 10/25/2014, SpO2 97 %. Body mass index is 28.94 kg/m.  Exam chaperoned by Chrisandra Carota, CMA Gen: Alert, well appearing.  Patient is oriented to person, place, time, and situation. AFFECT: pleasant, lucid thought and speech. ENT: Ears: EACs clear, normal epithelium.  TMs with good light reflex and landmarks bilaterally.  Eyes: no injection, icteris, swelling, or exudate.  EOMI, PERRLA. Nose: no drainage or turbinate edema/swelling.  No injection or focal lesion.  Mouth: lips without lesion/swelling.  Oral mucosa pink and moist.  Dentition intact and without obvious caries or gingival swelling.  Oropharynx without erythema, exudate, or swelling.  Neck: supple/nontender.  No LAD, mass, or TM.  Carotid pulses 2+ bilaterally, without bruits. CV: RRR, no m/r/g.   LUNGS: CTA bilat, nonlabored resps, good aeration in all lung fields. ABD: soft, NT, ND, BS normal.  No hepatospenomegaly or mass.  No bruits. EXT: no clubbing, cyanosis, or edema.  Musculoskeletal: no joint swelling, erythema, warmth, or tenderness.  ROM of all joints intact. Skin - no sores or suspicious lesions or rashes or color changes  Pertinent labs:  Lab Results  Component Value Date   TSH 1.17 12/04/2016   Lab Results  Component Value Date   WBC 5.9 12/04/2016   HGB 16.0 (H) 12/04/2016   HCT 48.2 (H) 12/04/2016   MCV 97.1 12/04/2016   PLT 334.0 12/04/2016   Lab Results  Component Value Date   CREATININE 0.75 12/04/2016   BUN 9 12/04/2016   NA 133 (L) 12/04/2016   K 4.2 12/04/2016   CL 96 12/04/2016   CO2 31 12/04/2016   Lab Results  Component Value Date   ALT 22 12/04/2016   AST 23 12/04/2016   ALKPHOS 92 12/04/2016   BILITOT 0.5 12/04/2016   Lab Results  Component Value Date   CHOL 170 12/04/2016   Lab Results  Component Value Date   HDL 92.80 12/04/2016   Lab Results  Component  Value Date   LDLCALC 66 12/04/2016   Lab Results  Component Value Date   TRIG 57.0 12/04/2016   Lab Results  Component Value Date   CHOLHDL 2 12/04/2016    ASSESSMENT AND PLAN:   Health maintenance exam: Reviewed age and gender appropriate health maintenance issues (prudent diet, regular exercise, health risks of tobacco and excessive alcohol, use of seatbelts, fire alarms in home, use of sunscreen).  Also reviewed age and gender appropriate health screening as well as vaccine recommendations. Vaccines: flu vaccine--> given today.   Shingrix-->pt to think about this. Labs: HP labs to be done via [facility]--ordered future today. Cervical ca screening: last pap 11/06/15--> plans as per GYN-->pt states 5 yr interval at this time. Breast ca screening: last mammo 01/21/17-->Plans for repeat as per GYN Colon ca screening: next colonoscopy due 2024.  An After Visit Summary was printed and given to the patient.  FOLLOW UP:  Return in about 6 months (around 07/01/2018) for routine chronic illness f/u.  Signed:  Crissie Sickles, MD           12/30/2017

## 2018-01-06 ENCOUNTER — Other Ambulatory Visit: Payer: Self-pay | Admitting: Family Medicine

## 2018-01-15 ENCOUNTER — Other Ambulatory Visit: Payer: Self-pay | Admitting: Family Medicine

## 2018-01-15 DIAGNOSIS — Z0000 Encounter for general adult medical examination without abnormal findings: Secondary | ICD-10-CM | POA: Diagnosis not present

## 2018-01-16 LAB — CBC WITH DIFFERENTIAL/PLATELET
BASOS ABS: 0.1 10*3/uL (ref 0.0–0.2)
Basos: 1 %
EOS (ABSOLUTE): 0.1 10*3/uL (ref 0.0–0.4)
Eos: 2 %
Hematocrit: 43.4 % (ref 34.0–46.6)
Hemoglobin: 14.6 g/dL (ref 11.1–15.9)
IMMATURE GRANS (ABS): 0 10*3/uL (ref 0.0–0.1)
IMMATURE GRANULOCYTES: 0 %
LYMPHS: 28 %
Lymphocytes Absolute: 1.6 10*3/uL (ref 0.7–3.1)
MCH: 31.7 pg (ref 26.6–33.0)
MCHC: 33.6 g/dL (ref 31.5–35.7)
MCV: 94 fL (ref 79–97)
Monocytes Absolute: 0.5 10*3/uL (ref 0.1–0.9)
Monocytes: 8 %
NEUTROS PCT: 61 %
Neutrophils Absolute: 3.5 10*3/uL (ref 1.4–7.0)
PLATELETS: 336 10*3/uL (ref 150–450)
RBC: 4.6 x10E6/uL (ref 3.77–5.28)
RDW: 11.7 % — ABNORMAL LOW (ref 12.3–15.4)
WBC: 5.8 10*3/uL (ref 3.4–10.8)

## 2018-01-16 LAB — COMPREHENSIVE METABOLIC PANEL
ALT: 22 IU/L (ref 0–32)
AST: 27 IU/L (ref 0–40)
Albumin/Globulin Ratio: 1.9 (ref 1.2–2.2)
Albumin: 4.5 g/dL (ref 3.5–5.5)
Alkaline Phosphatase: 103 IU/L (ref 39–117)
BILIRUBIN TOTAL: 0.3 mg/dL (ref 0.0–1.2)
BUN/Creatinine Ratio: 16 (ref 9–23)
BUN: 11 mg/dL (ref 6–24)
CALCIUM: 9.6 mg/dL (ref 8.7–10.2)
CHLORIDE: 97 mmol/L (ref 96–106)
CO2: 24 mmol/L (ref 20–29)
Creatinine, Ser: 0.69 mg/dL (ref 0.57–1.00)
GFR, EST AFRICAN AMERICAN: 115 mL/min/{1.73_m2} (ref >59)
GFR, EST NON AFRICAN AMERICAN: 100 mL/min/{1.73_m2} (ref >59)
GLUCOSE: 83 mg/dL (ref 65–99)
Globulin, Total: 2.4 g/dL (ref 1.5–4.5)
Potassium: 4.6 mmol/L (ref 3.5–5.2)
Sodium: 135 mmol/L (ref 134–144)
TOTAL PROTEIN: 6.9 g/dL (ref 6.0–8.5)

## 2018-01-16 LAB — LIPID PANEL
Chol/HDL Ratio: 1.7 ratio (ref 0.0–4.4)
Cholesterol, Total: 156 mg/dL (ref 100–199)
HDL: 94 mg/dL (ref >39)
LDL Calculated: 54 mg/dL (ref 0–99)
TRIGLYCERIDES: 42 mg/dL (ref 0–149)
VLDL CHOLESTEROL CAL: 8 mg/dL (ref 5–40)

## 2018-01-16 LAB — TSH: TSH: 1.08 u[IU]/mL (ref 0.450–4.500)

## 2018-02-07 ENCOUNTER — Other Ambulatory Visit: Payer: Self-pay | Admitting: Family Medicine

## 2018-02-11 ENCOUNTER — Other Ambulatory Visit: Payer: Self-pay | Admitting: Family Medicine

## 2018-02-12 NOTE — Telephone Encounter (Signed)
RF request for Effexor  LOV: 12/30/17 Next ov: Not scheduled- per last OV due  07/01/2018 Last written: 10/06/2018 #90 w/ 0 refills   Please advise

## 2018-05-05 ENCOUNTER — Other Ambulatory Visit: Payer: Self-pay | Admitting: Family Medicine

## 2018-07-14 ENCOUNTER — Other Ambulatory Visit: Payer: Self-pay | Admitting: Family Medicine

## 2018-07-14 ENCOUNTER — Telehealth: Payer: Self-pay | Admitting: Family Medicine

## 2018-07-14 MED ORDER — LISINOPRIL 40 MG PO TABS: 40.0000 mg | ORAL_TABLET | Freq: Every day | ORAL | 0 refills | Status: DC

## 2018-07-14 NOTE — Telephone Encounter (Signed)
Call from [facility], patient calling requesting an appt to see PCP-Dr. McGowen. She states that she is light headed and dizzy. "probably my blood pressure." She stated earlier her blood pressure reading was 150/90. She also stated she was out of blood pressure meds. She is requesting to come today.  In review of the schedule, I offered her a virtual visit with Dr. Anitra Lauth. She declined. She is requesting in office appt with her PCP.  At this time, no in office appts. First available is on Monday 6/29.   Please advise and contact patient at [phone].

## 2018-07-14 NOTE — Telephone Encounter (Signed)
I'll send in 1 mo supply of lisinopril 40. She should still have plenty of hctz. She was offered a virtual visit, and I cannot work her in for in-office visit. She should take the appt in-office 07/19/18.

## 2018-07-14 NOTE — Telephone Encounter (Signed)
Any recommendations or suggestions? Please advise, thanks.

## 2018-07-15 ENCOUNTER — Emergency Department (HOSPITAL_COMMUNITY): Payer: 59

## 2018-07-15 ENCOUNTER — Other Ambulatory Visit: Payer: Self-pay

## 2018-07-15 ENCOUNTER — Telehealth: Payer: Self-pay | Admitting: Family Medicine

## 2018-07-15 ENCOUNTER — Emergency Department (HOSPITAL_COMMUNITY)
Admission: EM | Admit: 2018-07-15 | Discharge: 2018-07-15 | Disposition: A | Discharge status: Home / Self Care | Payer: 59 | Attending: Emergency Medicine | Admitting: Emergency Medicine

## 2018-07-15 DIAGNOSIS — Z79899 Other long term (current) drug therapy: Secondary | ICD-10-CM | POA: Insufficient documentation

## 2018-07-15 DIAGNOSIS — I10 Essential (primary) hypertension: Secondary | ICD-10-CM | POA: Diagnosis present

## 2018-07-15 DIAGNOSIS — R42 Dizziness and giddiness: Secondary | ICD-10-CM | POA: Insufficient documentation

## 2018-07-15 DIAGNOSIS — F17210 Nicotine dependence, cigarettes, uncomplicated: Secondary | ICD-10-CM | POA: Insufficient documentation

## 2018-07-15 LAB — CBC WITH DIFFERENTIAL/PLATELET
Abs Immature Granulocytes: 0.02 10*3/uL (ref 0.00–0.07)
Basophils Absolute: 0 10*3/uL (ref 0.0–0.1)
Basophils Relative: 1 %
Eosinophils Absolute: 0.1 10*3/uL (ref 0.0–0.5)
Eosinophils Relative: 2 %
HCT: 43.4 % (ref 36.0–46.0)
Hemoglobin: 14.5 g/dL (ref 12.0–15.0)
Immature Granulocytes: 0 %
Lymphocytes Relative: 28 %
Lymphs Abs: 1.7 10*3/uL (ref 0.7–4.0)
MCH: 32 pg (ref 26.0–34.0)
MCHC: 33.4 g/dL (ref 30.0–36.0)
MCV: 95.8 fL (ref 80.0–100.0)
Monocytes Absolute: 0.6 10*3/uL (ref 0.1–1.0)
Monocytes Relative: 10 %
Neutro Abs: 3.6 10*3/uL (ref 1.7–7.7)
Neutrophils Relative %: 59 %
Platelets: 308 10*3/uL (ref 150–400)
RBC: 4.53 MIL/uL (ref 3.87–5.11)
RDW: 11.9 % (ref 11.5–15.5)
WBC: 6 10*3/uL (ref 4.0–10.5)
nRBC: 0 % (ref 0.0–0.2)

## 2018-07-15 LAB — BASIC METABOLIC PANEL
Anion gap: 10 (ref 5–15)
BUN: 9 mg/dL (ref 6–20)
CO2: 24 mmol/L (ref 22–32)
Calcium: 8.9 mg/dL (ref 8.9–10.3)
Chloride: 100 mmol/L (ref 98–111)
Creatinine, Ser: 0.7 mg/dL (ref 0.44–1.00)
GFR calc Af Amer: >60 mL/min (ref >60)
GFR calc non Af Amer: >60 mL/min (ref >60)
Glucose, Bld: 92 mg/dL (ref 70–99)
Potassium: 4.4 mmol/L (ref 3.5–5.1)
Sodium: 134 mmol/L — ABNORMAL LOW (ref 135–145)

## 2018-07-15 LAB — URINALYSIS, ROUTINE W REFLEX MICROSCOPIC
Bilirubin Urine: NEGATIVE
Glucose, UA: NEGATIVE mg/dL
Hgb urine dipstick: NEGATIVE
Ketones, ur: NEGATIVE mg/dL
Leukocytes,Ua: NEGATIVE
Nitrite: NEGATIVE
Protein, ur: NEGATIVE mg/dL
Specific Gravity, Urine: 1.016 (ref 1.005–1.030)
pH: 7 (ref 5.0–8.0)

## 2018-07-15 LAB — TROPONIN I (HIGH SENSITIVITY): Troponin I (High Sensitivity): 2 ng/L (ref <18)

## 2018-07-15 NOTE — Telephone Encounter (Signed)
Pt has appt with PCP tomorrow for elevated BP.

## 2018-07-15 NOTE — ED Notes (Signed)
Pt verbalized understanding of d/c instructions and has no further questions, VSS, NAD.  

## 2018-07-15 NOTE — ED Triage Notes (Signed)
Pt endorses hypertension with lightheadedness since yesterday. Takes lisinopril and hctz and hasn't missed any doses. Neuro intact. Axox4. Denies CP or other cardiac sx.

## 2018-07-15 NOTE — Discharge Instructions (Signed)
You have been seen today for high blood pressure. Please read and follow all provided instructions. Return to the emergency room for worsening condition or new concerning symptoms.    1. Medications:  Continue usual home medications Take medications as prescribed. Please review all of the medicines and only take them if you do not have an allergy to them.   2. Treatment: rest, drink plenty of fluids  3. Follow Up: Please follow up with your primary doctor for discussion of your diagnoses and further evaluation after today's visit;   Recommend you keep your appointment as scheduled on the 29th ?

## 2018-07-15 NOTE — ED Provider Notes (Signed)
[facility] EMERGENCY DEPARTMENT Provider Note   CSN: [id number] Arrival date & time: 07/15/18  0867    History   Chief Complaint Chief Complaint  Patient presents with  . Hypertension  . Dizziness    HPI Kristin Day is a 53 y.o. female history of anxiety and depression, hypertension presents emergency department today with chief complaint of hypertension and dizziness x2 days.  Patient states yesterday she felt like her blood pressure was high at work so she checked it and it was 150/90.  He was also feeling dizzy with exertion at that time.  She called her PCP office who was unable to see her in office and recommended ED evaluation.  She states she attempted to relax overnight.  When she rechecked her blood pressure this morning it was 210/100, and her to present to emergency department. She denies any fever, chills, headache, visual changes shortness of breath, palpitations, nausea, vomiting, abdominal pain, urinary symptoms.  At this time she is asymptomatic.  Denies family history of cardiac disease.   Past Medical History:  Diagnosis Date  . Anxiety and depression 11/11/2011   Started counseling 12/2015, counselor recommended effexor so I started it 01/02/16.  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  However, unclear etiology.  ENT considering dx of SSNHL or otosclerosis.  ENT rechecking 2 wks as of 08/03/17 office note.  . Hypertension   . Menometrorrhagia 11/11/2011  . Overweight(278.02) 11/11/2011   Lipids excellent 10/2012  . Perimenopausal disorder 2013  . Sigmoid diverticulosis    on colonoscopy 02/2017  . Tobacco dependence     Patient Active Problem List   Diagnosis Date Noted  . Laryngitis 11/17/2014  . Cough 11/17/2014  . Health maintenance examination 11/16/2013  . 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 Overweight(278.02) 11/11/2011  . Preventative health care 11/11/2011  . Anxiety and  depression 11/11/2011  . Tobacco abuse 11/11/2011  . Irregular menses 11/11/2011  . Hypertension     Past Surgical History:  Procedure Laterality Date  . CESAREAN SECTION  1991  . COLONOSCOPY W/ POLYPECTOMY  03/06/2017   adenomatous polyp: recall 5 yrs     OB History   No obstetric history on file.      Home Medications    Prior to Admission medications   Medication Sig Start Date End Date Taking? Authorizing Provider  hydrochlorothiazide (HYDRODIURIL) 25 MG tablet TAKE 1 TABLET BY MOUTH EVERY DAY Patient taking differently: Take 25 mg by mouth daily.  05/05/18  Yes McGowen, Adrian Blackwater, MD  ibuprofen (ADVIL) 200 MG tablet Take 400 mg by mouth once.   Yes [provider]  lisinopril (ZESTRIL) 40 MG tablet Take 1 tablet (40 mg total) by mouth daily. OFFICE VISIT NEEDED Patient taking differently: Take 40 mg by mouth daily.  07/14/18  Yes McGowen, Adrian Blackwater, MD  venlafaxine XR (EFFEXOR-XR) 75 MG 24 hr capsule TAKE 1 CAPSULE (75 MG TOTAL) BY MOUTH DAILY WITH BREAKFAST. 02/12/18  Yes McGowen, Adrian Blackwater, MD    Family History Family History  Problem Relation Age of Onset  . COPD Mother        smoker  . Emphysema Mother   . Hypertension Father   . Cancer Father        skin/BCC and prostate  . Colon polyps Father   . Cancer Maternal Grandmother        ovarian  . Emphysema Maternal Grandfather        ?  Marland Kitchen  Heart disease Paternal Grandmother   . Heart disease Paternal Grandfather   . Hypertension Paternal Grandfather   . Diabetes Paternal Grandfather   . Colon cancer Neg Hx   . Rectal cancer Neg Hx   . Stomach cancer Neg Hx   . Esophageal cancer Neg Hx     Social History Social History   Tobacco Use  . Smoking status: Current Every Day Smoker    Packs/day: 0.50    Years: 30.00    Pack years: 15.00    Types: Cigarettes  . Smokeless tobacco: Never Used  Substance Use Topics  . Alcohol use: Yes    Comment: 6-12 beers weekly  . Drug use: No     Allergies    Tamiflu [oseltamivir]   Review of Systems Review of Systems  Constitutional: Negative for chills and fever.  HENT: Negative for congestion, ear discharge, ear pain, sinus pressure, sinus pain and sore throat.   Eyes: Negative for pain and redness.  Respiratory: Negative for cough and shortness of breath.   Cardiovascular: Negative for chest pain.  Gastrointestinal: Negative for abdominal pain, constipation, diarrhea, nausea and vomiting.  Genitourinary: Negative for dysuria and hematuria.  Musculoskeletal: Negative for back pain and neck pain.  Skin: Negative for wound.  Neurological: Negative for dizziness, weakness, numbness and headaches.     Physical Exam Updated Vital Signs BP (!) 169/97 (BP Location: Right Arm)   Pulse 88   Temp 98.2 F (36.8 C) (Oral)   Resp 14   LMP 10/25/2014   SpO2 100%   Physical Exam Vitals signs and nursing note reviewed.  Constitutional:      General: She is not in acute distress.    Appearance: She is not ill-appearing.  HENT:     Head: Normocephalic and atraumatic.     Right Ear: Tympanic membrane and external ear normal.     Left Ear: Tympanic membrane and external ear normal.     Nose: Nose normal.     Mouth/Throat:     Mouth: Mucous membranes are moist.     Pharynx: Oropharynx is clear.  Eyes:     General: No scleral icterus.       Right eye: No discharge.        Left eye: No discharge.     Extraocular Movements: Extraocular movements intact.     Conjunctiva/sclera: Conjunctivae normal.     Pupils: Pupils are equal, round, and reactive to light.  Neck:     Musculoskeletal: Normal range of motion.     Vascular: No JVD.  Cardiovascular:     Rate and Rhythm: Normal rate and regular rhythm.     Pulses: Normal pulses.          Radial pulses are 2+ on the right side and 2+ on the left side.     Heart sounds: Normal heart sounds.  Pulmonary:     Comments: Lungs clear to auscultation in all fields. Symmetric chest rise. No  wheezing, rales, or rhonchi. Chest:     Chest wall: No tenderness.  Abdominal:     Comments: Abdomen is soft, non-distended, and non-tender in all quadrants. No rigidity, no guarding. No peritoneal signs.  Musculoskeletal: Normal range of motion.  Skin:    General: Skin is warm and dry.     Capillary Refill: Capillary refill takes less than 2 seconds.  Neurological:     Mental Status: She is oriented to person, place, and time.     GCS: GCS eye subscore  is 4. GCS verbal subscore is 5. GCS motor subscore is 6.     Comments: Mental Status:  Alert, oriented, thought content appropriate, able to give a coherent history. Speech fluent without evidence of aphasia. Able to follow 2 step commands without difficulty.  Cranial Nerves:  II:  Peripheral visual fields grossly normal, pupils equal, round, reactive to light III,IV, VI: ptosis not present, extra-ocular motions intact bilaterally  V,VII: smile symmetric, facial light touch sensation equal VIII: hearing grossly normal to voice  X: uvula elevates symmetrically  XI: bilateral shoulder shrug symmetric and strong XII: midline tongue extension without fassiculations Motor:  Normal tone. 5/5 in upper and lower extremities bilaterally including strong and equal grip strength and dorsiflexion/plantar flexion Sensory: Pinprick and light touch normal in all extremities.  Deep Tendon Reflexes: 2+ and symmetric in the biceps and patella Cerebellar: normal finger-to-nose with bilateral upper extremities Gait: normal gait and balance CV: distal pulses palpable throughout    Psychiatric:        Behavior: Behavior normal.      ED Treatments / Results  Labs (all labs ordered are listed, but only abnormal results are displayed) Labs Reviewed  BASIC METABOLIC PANEL - Abnormal; Notable for the following components:      Result Value   Sodium 134 (*)    All other components within normal limits  URINALYSIS, ROUTINE W REFLEX MICROSCOPIC   TROPONIN I (HIGH SENSITIVITY)  CBC WITH DIFFERENTIAL/PLATELET  TROPONIN I (HIGH SENSITIVITY)    EKG EKG Interpretation  Date/Time: 15-Jul-2018    10:03:48   Ventricular Rate:  83 bpm    PR Interval: *   QRS Duration:  92 ms   QT/QTC: 361/425   P-R-t Axes:  77    78     67     Text Interpretation: Sinus rhythm. Short PR interval. Probable left atrial enlargement. No prior to compare.   Radiology Dg Chest 2 View  Result Date: 07/15/2018 CLINICAL DATA:  hypertension, chronic cough EXAM: CHEST - 2 VIEW COMPARISON:  11/30/2014 FINDINGS: The heart size and mediastinal contours are within normal limits. Both lungs are clear. The visualized skeletal structures are unremarkable. IMPRESSION: No acute abnormality of the lungs.  No focal airspace opacity. Electronically Signed   By: Eddie Candle M.D.   On: 07/15/2018 11:42    Procedures Procedures (including critical care time)  Medications Ordered in ED Medications - No data to display   Initial Impression / Assessment and Plan / ED Course  I have reviewed the triage vital signs and the nursing notes.  Pertinent labs & imaging results that were available during my care of the patient were reviewed by me and considered in my medical decision making (see chart for details).  53 yo female presents with concern for elevated blood pressure. On arrival she is asymptomatic. Pt is in no acute distress, non toxic appearing, BP 169/97.  Normal neurological exam.  Will screen with EKG and basic labs CBC, BMP, UA, troponin to exclude endorgan damage. EKG viewed by myself an attending shows without ischemic changes.  Labs reviewed and are unremarkable. Discussed elevated blood pressure with the patient and the need for primary care follow up with potential need to initiate or change antihypertensive medications and/or for further evaluation. Discussed return precaution signs/symptoms for hypertensive emergency including headache, change in vision,  numbness, weakness, chest pain, dyspnea, dizziness, or lightheadedness with the patient. Pt confirmed understanding. Pt is stable at discharge. Findings and plan of care discussed with  supervising physician Dr. Tyrone Nine.  Vitals:   07/15/18 1015 07/15/18 1030 07/15/18 1145 07/15/18 1150  BP: (!) 162/87 (!) 152/81  (!) 159/83  Pulse: 80 85 76 80  Resp: 15 14 15 15   Temp:      TempSrc:      SpO2: 100% 98% 95% 99%    Final Clinical Impressions(s) / ED Diagnoses   Final diagnoses:  Hypertension, unspecified type    ED Discharge Orders    None       Cherre Robins, PA-C 07/15/18 [place], [place], DO 07/17/18 [phone]

## 2018-07-15 NOTE — Telephone Encounter (Signed)
Patient called in very upset that she could not be worked in for an appointment. She initially spoke with Diane and shared her frustration. 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 Patient came back to the line stating her blood pressure was 210/100. I advised Diane that I would talk with the patient after I got off of a call that I was on.  Kristin Day did have to hold for almost 10 minutes. While she was holding, I was able to ask the provider to advise on direction for a patient that had a blood pressure of 210/100. He did state that it was OK to advise the patient to seek care and an emergency room if her medication was not controlling her blood pressure and it was continuing to rise. Patient seemed tearful and upset at that direction and simply said "OK, thank you" and hung up.

## 2018-07-15 NOTE — Telephone Encounter (Signed)
Noted agree

## 2018-07-16 ENCOUNTER — Encounter: Payer: Self-pay | Admitting: Family Medicine

## 2018-07-16 ENCOUNTER — Ambulatory Visit (INDEPENDENT_AMBULATORY_CARE_PROVIDER_SITE_OTHER): Payer: 59 | Admitting: Family Medicine

## 2018-07-16 VITALS — BP 134/85 | HR 97 | Temp 98.2°F | Resp 16 | Ht 63.5 in | Wt 163.8 lb

## 2018-07-16 DIAGNOSIS — R0989 Other specified symptoms and signs involving the circulatory and respiratory systems: Secondary | ICD-10-CM | POA: Diagnosis not present

## 2018-07-16 MED ORDER — METOPROLOL TARTRATE 25 MG PO TABS: 25.0000 mg | ORAL_TABLET | Freq: Two times a day (BID) | ORAL | 0 refills | Status: DC

## 2018-07-16 NOTE — Progress Notes (Signed)
OFFICE VISIT  07/16/2018   CC:  Chief Complaint  Patient presents with  . Hypertension   HPI:    Patient is a 53 y.o. Caucasian female who presents for recent problems with uncontrolled HTN.   BP control per bp checks at work had consistently been 130/80 or better. Then she felt tired/a little dizzy and "foggy", almost nauseated--> at work a couple days ago and bp was 150s-160s/90s.  More symptomatic when more active.  She has been on hctz 25mg  qd and lisinopril 40mg  qd.  ED visit yesterday.  I reviewed entire record today. Presented b/c bp in morning 200/110.  No symptoms.  In ED her exam was normal.  BP ranged in 150s-160s over 80s, HR 70s - 80s. Normal CBC, BMET, Troponin I, and UA.  EKG w/out arrhythmia or ischemic changes. CXR normal. She was d/c'd home from ED w/out any new meds and told to f/u with me today.  Review of HR's in EMR from past office visits 70s-80s typically.  Interim hx: She has reflected some since ED visit, feels reassured some and feels like she is very stressed, particularly with work.  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  Not much time off work. In the last week she has cut way back on sodas.  Recent NSAID use of any significance?->no Recent OTC decongestant use?->no Her bp in the past office visits has been mostly normal with an occasional 433-295 systolic and upper 18A diastolic (one was 416 back in 2013).  She has never had renal artery dopplers or echocardiogram. She bought a new bp cuff today.  BP this morning before taking bp med was 154/90.  HR 90s.   Past Medical History:  Diagnosis Date  . Anxiety and depression 11/11/2011   Started counseling 12/2015, counselor recommended effexor so I started it 01/02/16.  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  However, unclear etiology.  ENT considering dx of SSNHL or otosclerosis.  ENT rechecking 2 wks as of 08/03/17 office  note.  . Hypertension   . Menometrorrhagia 11/11/2011  . Overweight(278.02) 11/11/2011   Lipids excellent 10/2012  . Perimenopausal disorder 2013  . Sigmoid diverticulosis    on colonoscopy 02/2017  . Tobacco dependence     Past Surgical History:  Procedure Laterality Date  . CESAREAN SECTION  1991  . COLONOSCOPY W/ POLYPECTOMY  03/06/2017   adenomatous polyp: recall 5 yrs    Outpatient Medications Prior to Visit  Medication Sig Dispense Refill  . hydrochlorothiazide (HYDRODIURIL) 25 MG tablet TAKE 1 TABLET BY MOUTH EVERY DAY (Patient taking differently: Take 25 mg by mouth daily. ) 90 tablet 0  . ibuprofen (ADVIL) 200 MG tablet Take 400 mg by mouth once.    Marland Kitchen lisinopril (ZESTRIL) 40 MG tablet Take 1 tablet (40 mg total) by mouth daily. OFFICE VISIT NEEDED (Patient taking differently: Take 40 mg by mouth daily. ) 30 tablet 0  . venlafaxine XR (EFFEXOR-XR) 75 MG 24 hr capsule TAKE 1 CAPSULE (75 MG TOTAL) BY MOUTH DAILY WITH BREAKFAST. 90 capsule 1   No facility-administered medications prior to visit.     Allergies  Allergen Reactions  . Tamiflu [Oseltamivir] Nausea Only    ROS ROS: no CP, no SOB, no wheezing, no cough, no vision c/o, no HAs, no rashes, no melena/hematochezia.  No polyuria or polydipsia.  No myalgias or arthralgias.   PE: Blood pressure 134/85, pulse 97,  temperature 98.2 F (36.8 C), temperature source Temporal, resp. rate 16, height 5' 3.5" (1.613 m), weight 163 lb 12.8 oz (74.3 kg), last menstrual period 10/25/2014, SpO2 95 %.  Chaperoned by Deveron Furlong, CMA  Gen: Alert, well appearing.  Patient is oriented to person, place, time, and situation. AFFECT: pleasant, lucid thought and speech. CV: RRR, no m/r/g.   LUNGS: CTA bilat, nonlabored resps, good aeration in all lung fields. ABD: soft, NT, ND, BS normal.  No hepatospenomegaly or mass.  No bruits. EXT: no clubbing or cyanosis.  no edema.    LABS:  Lab Results  Component Value Date   WBC 6.0  07/15/2018   HGB 14.5 07/15/2018   HCT 43.4 07/15/2018   MCV 95.8 07/15/2018   PLT 308 07/15/2018     Chemistry      Component Value Date/Time   NA 134 (L) 07/15/2018 1022   NA 135 01/15/2018 0823   K 4.4 07/15/2018 1022   CL 100 07/15/2018 1022   CO2 24 07/15/2018 1022   BUN 9 07/15/2018 1022   BUN 11 01/15/2018 0823   CREATININE 0.70 07/15/2018 1022      Component Value Date/Time   CALCIUM 8.9 07/15/2018 1022   ALKPHOS 103 01/15/2018 0823   AST 27 01/15/2018 0823   ALT 22 01/15/2018 0823   BILITOT 0.3 01/15/2018 0823     Lab Results  Component Value Date   CHOL 156 01/15/2018   HDL 94 01/15/2018   LDLCALC 54 01/15/2018   TRIG 42 01/15/2018   CHOLHDL 1.7 01/15/2018     IMPRESSION AND PLAN:  Labile HTN: likely some stress/anxiety contributing. Will add low dose lopressor: 25mg  bid.  Therapeutic expectations and side effect profile of medication discussed today.  Patient's questions answered. Continue all other bp meds at current doses. Signs/symptoms to call or return for were reviewed and pt expressed understanding.  An After Visit Summary was printed and given to the patient.  FOLLOW UP: Return in about 2 weeks (around 07/30/2018) for f/u HTN.  Signed:  Crissie Sickles, MD           07/16/2018

## 2018-07-19 ENCOUNTER — Ambulatory Visit: Payer: 59 | Admitting: Family Medicine

## 2018-07-27 ENCOUNTER — Other Ambulatory Visit: Payer: Self-pay | Admitting: Family Medicine

## 2018-07-30 ENCOUNTER — Ambulatory Visit: Payer: 59 | Admitting: Family Medicine

## 2018-08-02 ENCOUNTER — Other Ambulatory Visit: Payer: Self-pay

## 2018-08-02 MED ORDER — VENLAFAXINE HCL ER 75 MG PO CP24: 75.0000 mg | ORAL_CAPSULE | Freq: Every day | ORAL | 1 refills | Status: DC

## 2018-08-03 ENCOUNTER — Encounter: Payer: Self-pay | Admitting: Family Medicine

## 2018-08-03 ENCOUNTER — Ambulatory Visit: Payer: 59 | Admitting: Family Medicine

## 2018-08-03 ENCOUNTER — Other Ambulatory Visit: Payer: Self-pay

## 2018-08-03 VITALS — BP 130/80 | HR 71 | Temp 98.4°F | Resp 16 | Ht 63.5 in | Wt 164.4 lb

## 2018-08-03 DIAGNOSIS — I10 Essential (primary) hypertension: Secondary | ICD-10-CM

## 2018-08-03 MED ORDER — METOPROLOL TARTRATE 50 MG PO TABS: 50.0000 mg | ORAL_TABLET | Freq: Two times a day (BID) | ORAL | 0 refills | Status: DC

## 2018-08-03 NOTE — Progress Notes (Signed)
OFFICE VISIT  08/03/2018   CC:  Chief Complaint  Patient presents with  . Follow-up    hypertension     HPI:    Patient is a 53 y.o. Caucasian female who presents for 18 day f/u labile hypertension. Last visit I added lopressor 25mg  bid.  Interim hx: Says she is feeling fine. Reviewed her home bp's with her today. She started the metoprolol 6/30 BP avg since then has been 140s (occ 244-010 systolic) over high 27O-53.   HRs:70s-90s.   Felt sensation of slower heart rate 1st day she took it. Started to take it 2 times a day after 1 week and she has no side effects at all. She is frustrated about having HBP, knows she needs to quit smoking and lose wt.  Past Medical History:  Diagnosis Date  . Anxiety and depression 11/11/2011   Started counseling 12/2015, counselor recommended effexor so I started it 01/02/16.  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  However, unclear etiology.  ENT considering dx of SSNHL or otosclerosis.  ENT rechecking 2 wks as of 08/03/17 office note.  . Hypertension   . Menometrorrhagia 11/11/2011  . Overweight(278.02) 11/11/2011   Lipids excellent 10/2012  . Perimenopausal disorder 2013  . Sigmoid diverticulosis    on colonoscopy 02/2017  . Tobacco dependence     Past Surgical History:  Procedure Laterality Date  . CESAREAN SECTION  1991  . COLONOSCOPY W/ POLYPECTOMY  03/06/2017   adenomatous polyp: recall 5 yrs    Outpatient Medications Prior to Visit  Medication Sig Dispense Refill  . hydrochlorothiazide (HYDRODIURIL) 25 MG tablet TAKE 1 TABLET BY MOUTH EVERY DAY 90 tablet 0  . ibuprofen (ADVIL) 200 MG tablet Take 400 mg by mouth once.    Marland Kitchen lisinopril (ZESTRIL) 40 MG tablet Take 1 tablet (40 mg total) by mouth daily. OFFICE VISIT NEEDED (Patient taking differently: Take 40 mg by mouth daily. ) 30 tablet 0  . venlafaxine XR (EFFEXOR-XR) 75 MG 24 hr capsule Take 1 capsule (75 mg total) by mouth daily with breakfast. 90 capsule 1  .  metoprolol tartrate (LOPRESSOR) 25 MG tablet Take 1 tablet (25 mg total) by mouth 2 (two) times daily. 60 tablet 0   No facility-administered medications prior to visit.     Allergies  Allergen Reactions  . Tamiflu [Oseltamivir] Nausea Only    ROS As per HPI  PE: Blood pressure 130/80, pulse 71, temperature 98.4 F (36.9 C), temperature source Temporal, resp. rate 16, height 5' 3.5" (1.613 m), weight 164 lb 6.4 oz (74.6 kg), last menstrual period 10/25/2014, SpO2 95 %. Gen: Alert, well appearing.  Patient is oriented to person, place, time, and situation. AFFECT: pleasant, lucid thought and speech. CV: RRR (rate 70 by me), no m/r/g.   LUNGS: CTA bilat, nonlabored resps, good aeration in all lung fields. EXT: no clubbing or cyanosis.  no edema.    LABS:    Chemistry      Component Value Date/Time   NA 134 (L) 07/15/2018 1022   NA 135 01/15/2018 0823   K 4.4 07/15/2018 1022   CL 100 07/15/2018 1022   CO2 24 07/15/2018 1022   BUN 9 07/15/2018 1022   BUN 11 01/15/2018 0823   CREATININE 0.70 07/15/2018 1022      Component Value Date/Time   CALCIUM 8.9 07/15/2018 1022   ALKPHOS 103 01/15/2018 0823   AST 27 01/15/2018 0823   ALT 22 01/15/2018 0823   BILITOT 0.3  01/15/2018 0823       IMPRESSION AND PLAN:  Uncontrolled HTN: minimal improvement with lopressor 25mg  bid. Increase to 50mg  lopressor bid. Goal bp 130/80 reiterated.  Monitor bp/hr daily for now until we get to goals. Encouraged complete smoking cessation and increase in exercise +low Na diet. 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  An After Visit Summary was printed and given to the patient.  FOLLOW UP: Return for 3-4 weeks f/u HTN.  Signed:  Crissie Sickles, MD           08/03/2018

## 2018-08-14 ENCOUNTER — Other Ambulatory Visit: Payer: Self-pay | Admitting: Family Medicine

## 2018-08-16 NOTE — Telephone Encounter (Signed)
RF request for lisinopril LOV: 08/03/18 Next ov: not scheduled Last written: 07/15/18  Please advise

## 2018-08-26 ENCOUNTER — Telehealth: Payer: Self-pay | Admitting: Family Medicine

## 2018-09-16 ENCOUNTER — Other Ambulatory Visit: Payer: Self-pay

## 2018-09-16 MED ORDER — METOPROLOL TARTRATE 50 MG PO TABS: 50.0000 mg | ORAL_TABLET | Freq: Two times a day (BID) | ORAL | 0 refills | Status: DC

## 2018-09-16 NOTE — Telephone Encounter (Signed)
Medication sent to the pharmacy.

## 2018-09-16 NOTE — Telephone Encounter (Signed)
Patient scheduled the first available virtual visit. Can patient get enough bp med to make it to her 09/24/18 visit?

## 2018-09-24 ENCOUNTER — Ambulatory Visit (INDEPENDENT_AMBULATORY_CARE_PROVIDER_SITE_OTHER): Payer: 59 | Admitting: Family Medicine

## 2018-09-24 ENCOUNTER — Encounter: Payer: Self-pay | Admitting: Family Medicine

## 2018-09-24 ENCOUNTER — Other Ambulatory Visit: Payer: Self-pay

## 2018-09-24 VITALS — BP 146/84 | HR 77 | Temp 98.1°F | Wt 166.2 lb

## 2018-09-24 DIAGNOSIS — I10 Essential (primary) hypertension: Secondary | ICD-10-CM | POA: Diagnosis not present

## 2018-09-24 DIAGNOSIS — F329 Major depressive disorder, single episode, unspecified: Secondary | ICD-10-CM

## 2018-09-24 DIAGNOSIS — F32A Depression, unspecified: Secondary | ICD-10-CM

## 2018-09-24 DIAGNOSIS — F419 Anxiety disorder, unspecified: Secondary | ICD-10-CM

## 2018-09-24 NOTE — Progress Notes (Signed)
Virtual Visit via Video Note  I connected with Kristin Day on 09/24/18 at  4:00 PM EDT by a video enabled telemedicine application and verified that I am speaking with the correct person using two identifiers.  Location patient: home Location provider:work or home office Persons participating in the virtual visit: patient, provider  I discussed the limitations of evaluation and management by telemedicine and the availability of in person appointments. The patient expressed understanding and agreed to proceed.  HPI: 53 y/o WF being seen today for f/u HTN and anxiety and depression. Last visit 6 wks ago we increased her lopressor to 50mg  bid.  Interim hx: Feeling fine. Anxiety/depression not a problem lately. BP avg 140s/80s still, HR avg 75 or so. No problems with latest increase of lopressor.  No new complaints.  ROS: no CP, no SOB, no wheezing, no cough, no dizziness, no HAs, no rashes, no melena/hematochezia.  No polyuria or polydipsia.  No myalgias or arthralgias.   Past Medical History:  Diagnosis Date  . Anxiety and depression 11/11/2011   Started counseling 12/2015, counselor recommended effexor so I started it 01/02/16.  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  However, unclear etiology.  ENT considering dx of SSNHL or otosclerosis.  ENT rechecking 2 wks as of 08/03/17 office note.  . Hypertension   . Menometrorrhagia 11/11/2011  . Overweight(278.02) 11/11/2011   Lipids excellent 10/2012  . Perimenopausal disorder 2013  . Sigmoid diverticulosis    on colonoscopy 02/2017  . Tobacco dependence     Past Surgical History:  Procedure Laterality Date  . CESAREAN SECTION  1991  . COLONOSCOPY W/ POLYPECTOMY  03/06/2017   adenomatous polyp: recall 5 yrs    Family History  Problem Relation Age of Onset  . COPD Mother        smoker  . Emphysema Mother   . Hypertension Father   . Cancer Father        skin/BCC and prostate  . Colon polyps Father   . Cancer Maternal  Grandmother        ovarian  . Emphysema Maternal Grandfather        ?  Marland Kitchen Heart disease Paternal Grandmother   . Heart disease Paternal Grandfather   . Hypertension Paternal Grandfather   . Diabetes Paternal Grandfather   . Colon cancer Neg Hx   . Rectal cancer Neg Hx   . Stomach cancer Neg Hx   . Esophageal cancer Neg Hx     SOCIAL HX:  Social History   Socioeconomic History  . Marital status: Married    Spouse name: Not on file  . Number of children: Not on file  . Years of education: Not on file  . Highest education level: Not on file  Occupational History  . Not on file  Social Needs  . Financial resource strain: Not on file  . Food insecurity    Worry: Not on file    Inability: Not on file  . Transportation needs    Medical: Not on file    Non-medical: Not on file  Tobacco Use  . Smoking status: Current Every Day Smoker    Packs/day: 0.50    Years: 30.00    Pack years: 15.00    Types: Cigarettes  . Smokeless tobacco: Never Used  Substance and Sexual Activity  . Alcohol use: Yes    Comment: 6-12 beers weekly  . Drug use: No  . Sexual activity: Yes    Partners: Male  Lifestyle  .  Physical activity    Days per week: Not on file    Minutes per session: Not on file  . Stress: Not on file  Relationships  . Social Herbalist on phone: Not on file    Gets together: Not on file    Attends religious service: Not on file    Active member of club or organization: Not on file    Attends meetings of clubs or organizations: Not on file    Relationship status: Not on file  Other Topics Concern  . Not on file  Social History Narrative   Monogamous relationship as of 08/2012.   Has one adult son.   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   +Smoker, active as of 11/2014.   +Alcohol 2 beers a day, more on weekends as of 08/2012.   No drugs.           Current Outpatient Medications:  .  hydrochlorothiazide (HYDRODIURIL) 25 MG tablet, TAKE 1  TABLET BY MOUTH EVERY DAY, Disp: 90 tablet, Rfl: 0 .  lisinopril (ZESTRIL) 40 MG tablet, 1 tab po qd, Disp: 90 tablet, Rfl: 3 .  metoprolol tartrate (LOPRESSOR) 50 MG tablet, Take 1 tablet (50 mg total) by mouth 2 (two) times daily., Disp: 60 tablet, Rfl: 0 .  venlafaxine XR (EFFEXOR-XR) 75 MG 24 hr capsule, Take 1 capsule (75 mg total) by mouth daily with breakfast., Disp: 90 capsule, Rfl: 1 .  ibuprofen (ADVIL) 200 MG tablet, Take 400 mg by mouth once., Disp: , Rfl:   EXAM:  VITALS per patient if applicable: BP (!) 0000000 (BP Location: Left Arm, Patient Position: Sitting, Cuff Size: Normal)   Pulse 77   Temp 98.1 F (36.7 C) (Oral)   Wt 166 lb 3.2 oz (75.4 kg)   LMP 10/25/2014   SpO2 96%   BMI 28.98 kg/m    GENERAL: alert, oriented, appears well and in no acute distress  HEENT: atraumatic, conjunttiva clear, no obvious abnormalities on inspection of external nose and ears  NECK: normal movements of the head and neck  LUNGS: on inspection no signs of respiratory distress, breathing rate appears normal, no obvious gross SOB, gasping or wheezing  CV: no obvious cyanosis  MS: moves all visible extremities without noticeable abnormality  PSYCH/NEURO: pleasant and cooperative, no obvious depression or anxiety, speech and thought processing grossly intact  LABS: none today    Chemistry      Component Value Date/Time   NA 134 (L) 07/15/2018 1022   NA 135 01/15/2018 0823   K 4.4 07/15/2018 1022   CL 100 07/15/2018 1022   CO2 24 07/15/2018 1022   BUN 9 07/15/2018 1022   BUN 11 01/15/2018 0823   CREATININE 0.70 07/15/2018 1022      Component Value Date/Time   CALCIUM 8.9 07/15/2018 1022   ALKPHOS 103 01/15/2018 0823   AST 27 01/15/2018 0823   ALT 22 01/15/2018 0823   BILITOT 0.3 01/15/2018 0823     Lab Results  Component Value Date   CHOL 156 01/15/2018   HDL 94 01/15/2018   LDLCALC 54 01/15/2018   TRIG 42 01/15/2018   CHOLHDL 1.7 01/15/2018    ASSESSMENT AND  PLAN:  Discussed the following assessment and plan:  1) HTN: not controlled. Increase lopressor to 100 mg bid and continue lisinopril 40mg  qd and hctz 25mg  qd. Continue regular home bp/hr monitoring. No labs needed  2) Anxiety and depression: The current  medical regimen is effective;  continue present plan and medications.  I discussed the assessment and treatment plan with the patient. The patient was provided an opportunity to ask questions and all were answered. The patient agreed with the plan and demonstrated an understanding of the instructions.   The patient was advised to call back or seek an in-person evaluation if the symptoms worsen or if the condition fails to improve as anticipated.  F/u: 6 wks.  Signed:  Crissie Sickles, MD           09/24/2018

## 2018-09-30 ENCOUNTER — Other Ambulatory Visit: Payer: Self-pay

## 2018-09-30 MED ORDER — HYDROCHLOROTHIAZIDE 25 MG PO TABS: 25.0000 mg | ORAL_TABLET | Freq: Every day | ORAL | 1 refills | Status: DC

## 2018-10-05 ENCOUNTER — Other Ambulatory Visit: Payer: Self-pay | Admitting: Family Medicine

## 2018-10-27 ENCOUNTER — Other Ambulatory Visit: Payer: Self-pay | Admitting: Family Medicine

## 2018-11-25 ENCOUNTER — Other Ambulatory Visit: Payer: Self-pay | Admitting: Family Medicine

## 2018-12-24 ENCOUNTER — Other Ambulatory Visit: Payer: Self-pay | Admitting: Family Medicine

## 2018-12-30 ENCOUNTER — Ambulatory Visit (INDEPENDENT_AMBULATORY_CARE_PROVIDER_SITE_OTHER): Payer: 59 | Admitting: Family Medicine

## 2018-12-30 ENCOUNTER — Encounter: Payer: Self-pay | Admitting: Family Medicine

## 2018-12-30 ENCOUNTER — Other Ambulatory Visit: Payer: Self-pay

## 2018-12-30 VITALS — BP 142/80

## 2018-12-30 DIAGNOSIS — Z1231 Encounter for screening mammogram for malignant neoplasm of breast: Secondary | ICD-10-CM

## 2018-12-30 DIAGNOSIS — I10 Essential (primary) hypertension: Secondary | ICD-10-CM | POA: Diagnosis not present

## 2018-12-30 DIAGNOSIS — F17200 Nicotine dependence, unspecified, uncomplicated: Secondary | ICD-10-CM | POA: Diagnosis not present

## 2018-12-30 MED ORDER — AMLODIPINE BESYLATE 10 MG PO TABS: 10.0000 mg | ORAL_TABLET | Freq: Every day | ORAL | 1 refills | Status: DC

## 2018-12-30 NOTE — Progress Notes (Signed)
Virtual Visit via Video Note  I connected with pt on 12/30/18 at  9:00 AM EST by a video enabled telemedicine application and verified that I am speaking with the correct person using two identifiers.  Location patient: home Location provider:work or home office Persons participating in the virtual visit: patient, provider  I discussed the limitations of evaluation and management by telemedicine and the availability of in person appointments. The patient expressed understanding and agreed to proceed.  Telemedicine visit is a necessity given the COVID-19 restrictions in place at the current time.  HPI: 53 y/o WF being seen today for 3 mo f/u HTN. A/P as of last visit: "1) HTN: not controlled. Increase lopressor to 100 mg bid and continue lisinopril 40mg  qd and hctz 25mg  qd. Continue regular home bp/hr monitoring. No labs needed.  2) Anxiety and depression: The current medical regimen is effective;  continue present plan and medications."    Interim hx: Feeling well. Home BPs low 140s/80s, no HR numbers. NO exercise, works in office setting. Diet is fair at best per her report. Still smoking, has cut back to <10 per day.  Of note, her last "health maintenance exam" was exactly one year ago. She is in favor of coming by and getting fasting HP labs, also I'll order her annual screening mammogram.  ROS: no CP, no SOB, no wheezing, no cough, no dizziness, no HAs, no rashes, no melena/hematochezia.  No polyuria or polydipsia.  No myalgias or arthralgias.    Past Medical History:  Diagnosis Date  . Anxiety and depression 11/11/2011   Started counseling 12/2015, counselor recommended effexor so I started it 01/02/16.  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  However, unclear etiology.  ENT considering dx of SSNHL or otosclerosis.  ENT rechecking 2 wks as of 08/03/17 office note.  . Hypertension   . Menometrorrhagia 11/11/2011  . Overweight(278.02) 11/11/2011   Lipids  excellent 10/2012  . Perimenopausal disorder 2013  . Sigmoid diverticulosis    on colonoscopy 02/2017  . Tobacco dependence     Past Surgical History:  Procedure Laterality Date  . CESAREAN SECTION  1991  . COLONOSCOPY W/ POLYPECTOMY  03/06/2017   adenomatous polyp: recall 5 yrs    Family History  Problem Relation Age of Onset  . COPD Mother        smoker  . Emphysema Mother   . Hypertension Father   . Cancer Father        skin/BCC and prostate  . Colon polyps Father   . Cancer Maternal Grandmother        ovarian  . Emphysema Maternal Grandfather        ?  Marland Kitchen Heart disease Paternal Grandmother   . Heart disease Paternal Grandfather   . Hypertension Paternal Grandfather   . Diabetes Paternal Grandfather   . Colon cancer Neg Hx   . Rectal cancer Neg Hx   . Stomach cancer Neg Hx   . Esophageal cancer Neg Hx     SOCIAL HX:  Social History   Socioeconomic History  . Marital status: Married    Spouse name: Not on file  . Number of children: Not on file  . Years of education: Not on file  . Highest education level: Not on file  Occupational History  . Not on file  Tobacco Use  . Smoking status: Current Every Day Smoker    Packs/day: 0.50    Years: 30.00    Pack years: 15.00  Types: Cigarettes  . Smokeless tobacco: Never Used  Substance and Sexual Activity  . Alcohol use: Yes    Comment: 6-12 beers weekly  . Drug use: No  . Sexual activity: Yes    Partners: Male  Other Topics Concern  . Not on file  Social History Narrative   Monogamous relationship as of 08/2012.   Has one adult son.   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   +Smoker, active as of 11/2014.   +Alcohol 2 beers a day, more on weekends as of 08/2012.   No drugs.         Social Determinants of Health   Financial Resource Strain:   . Difficulty of Paying Living Expenses: Not on file  Food Insecurity:   . Worried About Charity fundraiser in the Last Year: Not on file  . Ran  Out of Food in the Last Year: Not on file  Transportation Needs:   . Lack of Transportation (Medical): Not on file  . Lack of Transportation (Non-Medical): Not on file  Physical Activity:   . Days of Exercise per Week: Not on file  . Minutes of Exercise per Session: Not on file  Stress:   . Feeling of Stress : Not on file  Social Connections:   . Frequency of Communication with Friends and Family: Not on file  . Frequency of Social Gatherings with Friends and Family: Not on file  . Attends Religious Services: Not on file  . Active Member of Clubs or Organizations: Not on file  . Attends Archivist Meetings: Not on file  . Marital Status: Not on file     Current Outpatient Medications:  .  hydrochlorothiazide (HYDRODIURIL) 25 MG tablet, Take 1 tablet (25 mg total) by mouth daily., Disp: 90 tablet, Rfl: 1 .  lisinopril (ZESTRIL) 40 MG tablet, 1 tab po qd, Disp: 90 tablet, Rfl: 3 .  metoprolol tartrate (LOPRESSOR) 100 MG tablet, Take 1 tablet (100 mg total) by mouth 2 (two) times daily., Disp: 180 tablet, Rfl: 0 .  venlafaxine XR (EFFEXOR-XR) 75 MG 24 hr capsule, Take 1 capsule (75 mg total) by mouth daily with breakfast., Disp: 90 capsule, Rfl: 1 .  ibuprofen (ADVIL) 200 MG tablet, Take 400 mg by mouth once., Disp: , Rfl:   EXAM:  VITALS per patient if applicable: BP (!) XX123456 (BP Location: Left Arm, Patient Position: Sitting, Cuff Size: Normal)   LMP 10/25/2014    GENERAL: alert, oriented, appears well and in no acute distress  HEENT: atraumatic, conjunttiva clear, no obvious abnormalities on inspection of external nose and ears  NECK: normal movements of the head and neck  LUNGS: on inspection no signs of respiratory distress, breathing rate appears normal, no obvious gross SOB, gasping or wheezing  CV: no obvious cyanosis  MS: moves all visible extremities without noticeable abnormality  PSYCH/NEURO: pleasant and cooperative, no obvious depression or anxiety,  speech and thought processing grossly intact  LABS: none today    Chemistry      Component Value Date/Time   NA 134 (L) 07/15/2018 1022   NA 135 01/15/2018 0823   K 4.4 07/15/2018 1022   CL 100 07/15/2018 1022   CO2 24 07/15/2018 1022   BUN 9 07/15/2018 1022   BUN 11 01/15/2018 0823   CREATININE 0.70 07/15/2018 1022      Component Value Date/Time   CALCIUM 8.9 07/15/2018 1022   ALKPHOS 103 01/15/2018 0823  AST 27 01/15/2018 0823   ALT 22 01/15/2018 0823   BILITOT 0.3 01/15/2018 0823      ASSESSMENT AND PLAN:  Discussed the following assessment and plan:  1) Uncontrolled HTN: add amlodipine 10mg  qd and continue lopressor 100 mg bid, hctz 25mg  qd, and lisinopril 40mg  qd. Fasting HP labs ordered and she'll come in when fasting to get lab draw.  2) Breast ca screening: annual screening mammogram ordered.  3) Tobacco dependence: cutting back but I encouraged total cessation today as usual.  -we discussed possible serious and likely etiologies, options for evaluation and workup, limitations of telemedicine visit vs in person visit, treatment, treatment risks and precautions. Pt prefers to treat via telemedicine empirically rather then risking or undertaking an in person visit at this moment. Patient agrees to seek prompt in person care if worsening, new symptoms arise, or if is not improving with treatment.   I discussed the assessment and treatment plan with the patient. The patient was provided an opportunity to ask questions and all were answered. The patient agreed with the plan and demonstrated an understanding of the instructions.   The patient was advised to call back or seek an in-person evaluation if the symptoms worsen or if the condition fails to improve as anticipated.  F/u: 1 mo VV f/u HTN. Fasting lab appt at her earliest convenience.   Signed:  Crissie Sickles, MD           12/30/2018

## 2019-01-13 ENCOUNTER — Other Ambulatory Visit: Payer: Self-pay | Admitting: Family Medicine

## 2019-02-10 ENCOUNTER — Other Ambulatory Visit: Payer: Self-pay | Admitting: Family Medicine

## 2019-02-17 ENCOUNTER — Other Ambulatory Visit: Payer: Self-pay | Admitting: Family Medicine

## 2019-03-02 ENCOUNTER — Ambulatory Visit: Payer: 59

## 2019-03-13 ENCOUNTER — Other Ambulatory Visit: Payer: Self-pay | Admitting: Family Medicine

## 2019-03-19 ENCOUNTER — Other Ambulatory Visit: Payer: Self-pay | Admitting: Family Medicine

## 2019-04-08 ENCOUNTER — Other Ambulatory Visit: Payer: Self-pay

## 2019-04-08 ENCOUNTER — Ambulatory Visit
Admission: RE | Admit: 2019-04-08 | Discharge: 2019-04-08 | Disposition: A | Discharge status: Home / Self Care | Payer: 59 | Source: Ambulatory Visit | Attending: Family Medicine | Admitting: Family Medicine

## 2019-04-08 DIAGNOSIS — Z1231 Encounter for screening mammogram for malignant neoplasm of breast: Secondary | ICD-10-CM

## 2019-05-10 ENCOUNTER — Other Ambulatory Visit: Payer: Self-pay

## 2019-05-10 MED ORDER — VENLAFAXINE HCL ER 75 MG PO CP24: 75.0000 mg | ORAL_CAPSULE | Freq: Every day | ORAL | 0 refills | Status: DC

## 2019-05-23 ENCOUNTER — Encounter: Payer: 59 | Admitting: Family Medicine

## 2019-05-26 ENCOUNTER — Other Ambulatory Visit: Payer: Self-pay | Admitting: Family Medicine

## 2019-05-27 NOTE — Telephone Encounter (Signed)
Patient overdue for follow up.  14 day supply sent to pharmacy until her appointment 06/07/19.

## 2019-06-07 ENCOUNTER — Other Ambulatory Visit: Payer: Self-pay

## 2019-06-07 ENCOUNTER — Encounter: Payer: Self-pay | Admitting: Family Medicine

## 2019-06-07 ENCOUNTER — Ambulatory Visit (INDEPENDENT_AMBULATORY_CARE_PROVIDER_SITE_OTHER): Payer: 59 | Admitting: Family Medicine

## 2019-06-07 VITALS — BP 92/59 | HR 80 | Temp 97.8°F | Resp 16 | Ht 63.5 in | Wt 170.6 lb

## 2019-06-07 DIAGNOSIS — Z0000 Encounter for general adult medical examination without abnormal findings: Secondary | ICD-10-CM | POA: Diagnosis not present

## 2019-06-07 DIAGNOSIS — I10 Essential (primary) hypertension: Secondary | ICD-10-CM | POA: Diagnosis not present

## 2019-06-07 DIAGNOSIS — R002 Palpitations: Secondary | ICD-10-CM

## 2019-06-07 LAB — CBC WITH DIFFERENTIAL/PLATELET
Basophils Absolute: 0.1 10*3/uL (ref 0.0–0.1)
Basophils Relative: 2.2 % (ref 0.0–3.0)
Eosinophils Absolute: 0.1 10*3/uL (ref 0.0–0.7)
Eosinophils Relative: 1.7 % (ref 0.0–5.0)
HCT: 42 % (ref 36.0–46.0)
Hemoglobin: 14.4 g/dL (ref 12.0–15.0)
Lymphocytes Relative: 30.5 % (ref 12.0–46.0)
Lymphs Abs: 1.4 10*3/uL (ref 0.7–4.0)
MCHC: 34.4 g/dL (ref 30.0–36.0)
MCV: 94.8 fl (ref 78.0–100.0)
Monocytes Absolute: 0.5 10*3/uL (ref 0.1–1.0)
Monocytes Relative: 10.6 % (ref 3.0–12.0)
Neutro Abs: 2.5 10*3/uL (ref 1.4–7.7)
Neutrophils Relative %: 55 % (ref 43.0–77.0)
Platelets: 312 10*3/uL (ref 150.0–400.0)
RBC: 4.43 Mil/uL (ref 3.87–5.11)
RDW: 12.9 % (ref 11.5–15.5)
WBC: 4.5 10*3/uL (ref 4.0–10.5)

## 2019-06-07 LAB — COMPREHENSIVE METABOLIC PANEL
ALT: 17 U/L (ref 0–35)
AST: 21 U/L (ref 0–37)
Albumin: 4.2 g/dL (ref 3.5–5.2)
Alkaline Phosphatase: 88 U/L (ref 39–117)
BUN: 13 mg/dL (ref 6–23)
CO2: 29 mEq/L (ref 19–32)
Calcium: 9.2 mg/dL (ref 8.4–10.5)
Chloride: 97 mEq/L (ref 96–112)
Creatinine, Ser: 0.66 mg/dL (ref 0.40–1.20)
GFR: 93 mL/min (ref >60.00)
Glucose, Bld: 86 mg/dL (ref 70–99)
Potassium: 4.3 mEq/L (ref 3.5–5.1)
Sodium: 132 mEq/L — ABNORMAL LOW (ref 135–145)
Total Bilirubin: 0.4 mg/dL (ref 0.2–1.2)
Total Protein: 6.9 g/dL (ref 6.0–8.3)

## 2019-06-07 LAB — LIPID PANEL
Cholesterol: 152 mg/dL (ref 0–200)
HDL: 78.6 mg/dL (ref >39.00)
LDL Cholesterol: 65 mg/dL (ref 0–99)
NonHDL: 73.01
Total CHOL/HDL Ratio: 2
Triglycerides: 41 mg/dL (ref 0.0–149.0)
VLDL: 8.2 mg/dL (ref 0.0–40.0)

## 2019-06-07 MED ORDER — LISINOPRIL 40 MG PO TABS: ORAL_TABLET | ORAL | 3 refills | Status: DC

## 2019-06-07 MED ORDER — METOPROLOL TARTRATE 100 MG PO TABS: 100.0000 mg | ORAL_TABLET | Freq: Two times a day (BID) | ORAL | 3 refills | Status: DC

## 2019-06-07 MED ORDER — HYDROCHLOROTHIAZIDE 25 MG PO TABS: 25.0000 mg | ORAL_TABLET | Freq: Every day | ORAL | 3 refills | Status: DC

## 2019-06-07 NOTE — Patient Instructions (Signed)
Health Maintenance, Female Adopting a healthy lifestyle and getting preventive care are important in promoting health and wellness. Ask your health care provider about:  The right schedule for you to have regular tests and exams.  Things you can do on your own to prevent diseases and keep yourself healthy. What should I know about diet, weight, and exercise? Eat a healthy diet   Eat a diet that includes plenty of vegetables, fruits, low-fat dairy products, and lean protein.  Do not eat a lot of foods that are high in solid fats, added sugars, or sodium. Maintain a healthy weight Body mass index (BMI) is used to identify weight problems. It estimates body fat based on height and weight. Your health care provider can help determine your BMI and help you achieve or maintain a healthy weight. Get regular exercise Get regular exercise. This is one of the most important things you can do for your health. Most adults should:  Exercise for at least 150 minutes each week. The exercise should increase your heart rate and make you sweat (moderate-intensity exercise).  Do strengthening exercises at least twice a week. This is in addition to the moderate-intensity exercise.  Spend less time sitting. Even light physical activity can be beneficial. Watch cholesterol and blood lipids Have your blood tested for lipids and cholesterol at 55 years of age, then have this test every 5 years. Have your cholesterol levels checked more often if:  Your lipid or cholesterol levels are high.  You are older than 55 years of age.  You are at high risk for heart disease. What should I know about cancer screening? Depending on your health history and family history, you may need to have cancer screening at various ages. This may include screening for:  Breast cancer.  Cervical cancer.  Colorectal cancer.  Skin cancer.  Lung cancer. What should I know about heart disease, diabetes, and high blood  pressure? Blood pressure and heart disease  High blood pressure causes heart disease and increases the risk of stroke. This is more likely to develop in people who have high blood pressure readings, are of African descent, or are overweight.  Have your blood pressure checked: ? Every 3-5 years if you are 18-39 years of age. ? Every year if you are 40 years old or older. Diabetes Have regular diabetes screenings. This checks your fasting blood sugar level. Have the screening done:  Once every three years after age 40 if you are at a normal weight and have a low risk for diabetes.  More often and at a younger age if you are overweight or have a high risk for diabetes. What should I know about preventing infection? Hepatitis B If you have a higher risk for hepatitis B, you should be screened for this virus. Talk with your health care provider to find out if you are at risk for hepatitis B infection. Hepatitis C Testing is recommended for:  Everyone born from 1945 through 1965.  Anyone with known risk factors for hepatitis C. Sexually transmitted infections (STIs)  Get screened for STIs, including gonorrhea and chlamydia, if: ? You are sexually active and are younger than 55 years of age. ? You are older than 55 years of age and your health care provider tells you that you are at risk for this type of infection. ? Your sexual activity has changed since you were last screened, and you are at increased risk for chlamydia or gonorrhea. Ask your health care provider if   you are at risk.  Ask your health care provider about whether you are at high risk for HIV. Your health care provider may recommend a prescription medicine to help prevent HIV infection. If you choose to take medicine to prevent HIV, you should first get tested for HIV. You should then be tested every 3 months for as long as you are taking the medicine. Pregnancy  If you are about to stop having your period (premenopausal) and  you may become pregnant, seek counseling before you get pregnant.  Take 400 to 800 micrograms (mcg) of folic acid every day if you become pregnant.  Ask for birth control (contraception) if you want to prevent pregnancy. Osteoporosis and menopause Osteoporosis is a disease in which the bones lose minerals and strength with aging. This can result in bone fractures. If you are 65 years old or older, or if you are at risk for osteoporosis and fractures, ask your health care provider if you should:  Be screened for bone loss.  Take a calcium or vitamin D supplement to lower your risk of fractures.  Be given hormone replacement therapy (HRT) to treat symptoms of menopause. Follow these instructions at home: Lifestyle  Do not use any products that contain nicotine or tobacco, such as cigarettes, e-cigarettes, and chewing tobacco. If you need help quitting, ask your health care provider.  Do not use street drugs.  Do not share needles.  Ask your health care provider for help if you need support or information about quitting drugs. Alcohol use  Do not drink alcohol if: ? Your health care provider tells you not to drink. ? You are pregnant, may be pregnant, or are planning to become pregnant.  If you drink alcohol: ? Limit how much you use to 0-1 drink a day. ? Limit intake if you are breastfeeding.  Be aware of how much alcohol is in your drink. In the [place] one drink equals one 12 oz bottle of beer (355 mL), one 5 oz glass of wine (148 mL), or one 1 oz glass of hard liquor (44 mL). General instructions  Schedule regular health, dental, and eye exams.  Stay current with your vaccines.  Tell your health care provider if: ? You often feel depressed. ? You have ever been abused or do not feel safe at home. Summary  Adopting a healthy lifestyle and getting preventive care are important in promoting health and wellness.  Follow your health care provider's instructions about healthy  diet, exercising, and getting tested or screened for diseases.  Follow your health care provider's instructions on monitoring your cholesterol and blood pressure. This information is not intended to replace advice given to you by your health care provider. Make sure you discuss any questions you have with your health care provider. Document Revised: 12/30/2017 Document Reviewed: 12/30/2017 Elsevier Patient Education  2020 [employer]  

## 2019-06-07 NOTE — Progress Notes (Signed)
Office Note 06/07/2019  CC:  Chief Complaint  Patient presents with  . Annual Exam    pt is fasting    HPI:  Kristin Day is a 54 y.o. White female who is here for annual health maintenance exam.  No amlod x 3 wks, was out of this except 1 pill. Has been taking lopressor only qd. She is compliant with her lisinopril and hctz. Has been feeling intermittent rapid HR for about 3 wks now.  Feels this daily, notes it most often around bedtime. Felt fast HR 3 d/a, was 120, at rest. Felt it again last night, P117 at that time, at rest.  She took her last metoprolol at that time. Has felt rapid HR in middle of night but no HR check done at that time. Took her last amlod this morning. No prior hx of palpitations or rapid HR. No low HR. No dizziness, nausea, diaphoresis, CP, or SOB.  No otc decongestants. Drinks 4 caffeinated sodas per day, no change recently.  No coffee or tea. Chronic high stress due to job mostly, actually improved some lately.    Past Medical History:  Diagnosis Date  . Anxiety and depression 11/11/2011   Started counseling 12/2015, counselor recommended effexor so I started it 01/02/16.  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  However, unclear etiology.  ENT considering dx of SSNHL or otosclerosis.  ENT rechecking 2 wks as of 08/03/17 office note.  . Hypertension   . Menometrorrhagia 11/11/2011  . Overweight(278.02) 11/11/2011   Lipids excellent 10/2012  . Perimenopausal disorder 2013  . Sigmoid diverticulosis    on colonoscopy 02/2017  . Tobacco dependence     Past Surgical History:  Procedure Laterality Date  . CESAREAN SECTION  1991  . COLONOSCOPY W/ POLYPECTOMY  03/06/2017   adenomatous polyp: recall 5 yrs    Family History  Problem Relation Age of Onset  . COPD Mother        smoker  . Emphysema Mother   . Hypertension Father   . Cancer Father        skin/BCC and prostate  . Colon polyps Father   . Cancer Maternal Grandmother         ovarian  . Emphysema Maternal Grandfather        ?  Marland Kitchen Heart disease Paternal Grandmother   . Heart disease Paternal Grandfather   . Hypertension Paternal Grandfather   . Diabetes Paternal Grandfather   . Colon cancer Neg Hx   . Rectal cancer Neg Hx   . Stomach cancer Neg Hx   . Esophageal cancer Neg Hx     Social History   Socioeconomic History  . Marital status: Married    Spouse name: Not on file  . Number of children: Not on file  . Years of education: Not on file  . Highest education level: Not on file  Occupational History  . Not on file  Tobacco Use  . Smoking status: Current Every Day Smoker    Packs/day: 0.50    Years: 30.00    Pack years: 15.00    Types: Cigarettes  . Smokeless tobacco: Never Used  Substance and Sexual Activity  . Alcohol use: Yes    Comment: 6-12 beers weekly  . Drug use: No  . Sexual activity: Yes    Partners: Male  Other Topics Concern  . Not on file  Social History Narrative   Monogamous relationship as of 08/2012.   Has one adult  son.   Works as a PT at [employer] in [place].   +Smoker, active as of 11/2014.   +Alcohol 2 beers a day, more on weekends as of 08/2012.   No drugs.         Social Determinants of Health   Financial Resource Strain:   . Difficulty of Paying Living Expenses:   Food Insecurity:   . Worried About Charity fundraiser in the Last Year:   . Arboriculturist in the Last Year:   Transportation Needs:   . Film/video editor (Medical):   Marland Kitchen Lack of Transportation (Non-Medical):   Physical Activity:   . Days of Exercise per Week:   . Minutes of Exercise per Session:   Stress:   . Feeling of Stress :   Social Connections:   . Frequency of Communication with Friends and Family:   . Frequency of Social Gatherings with Friends and Family:   . Attends Religious Services:   . Active Member of Clubs or Organizations:   . Attends Archivist Meetings:   Marland Kitchen Marital Status:   Intimate  Partner Violence:   . Fear of Current or Ex-Partner:   . Emotionally Abused:   Marland Kitchen Physically Abused:   . Sexually Abused:     Outpatient Medications Prior to Visit  Medication Sig Dispense Refill  . hydrochlorothiazide (HYDRODIURIL) 25 MG tablet Take 1 tablet (25 mg total) by mouth daily. 90 tablet 1  . lisinopril (ZESTRIL) 40 MG tablet 1 tab po qd 90 tablet 3  . metoprolol tartrate (LOPRESSOR) 100 MG tablet Take 1 tablet (100 mg total) by mouth 2 (two) times daily. 180 tablet 0  . venlafaxine XR (EFFEXOR-XR) 75 MG 24 hr capsule TAKE 1 CAPSULE (75 MG TOTAL) BY MOUTH DAILY WITH BREAKFAST. 14 capsule 0  . amLODipine (NORVASC) 10 MG tablet TAKE 1 TABLET BY MOUTH EVERY DAY (Patient not taking: Reported on 06/07/2019) 30 tablet 0  . ibuprofen (ADVIL) 200 MG tablet Take 400 mg by mouth once.     No facility-administered medications prior to visit.    Allergies  Allergen Reactions  . Tamiflu [Oseltamivir] Nausea Only    ROS Review of Systems  Constitutional: Negative for appetite change, chills, fatigue and fever.  HENT: Negative for congestion, dental problem, ear pain and sore throat.   Eyes: Negative for discharge, redness and visual disturbance.  Respiratory: Negative for cough, chest tightness, shortness of breath and wheezing.   Cardiovascular: Positive for palpitations. Negative for chest pain and leg swelling.  Gastrointestinal: Negative for abdominal pain, blood in stool, diarrhea, nausea and vomiting.  Genitourinary: Negative for difficulty urinating, dysuria, flank pain, frequency, hematuria and urgency.  Musculoskeletal: Negative for arthralgias, back pain, joint swelling, myalgias and neck stiffness.  Skin: Negative for pallor and rash.  Neurological: Negative for dizziness, speech difficulty, weakness and headaches.  Hematological: Negative for adenopathy. Does not bruise/bleed easily.  Psychiatric/Behavioral: Negative for confusion and sleep disturbance. The patient is not  nervous/anxious.     PE; Wt Readings from Last 3 Encounters:  06/07/19 170 lb 9.6 oz (77.4 kg)  09/24/18 166 lb 3.2 oz (75.4 kg)  08/03/18 164 lb 6.4 oz (74.6 kg)   Temp Readings from Last 3 Encounters:  06/07/19 97.8 F (36.6 C) (Temporal)  09/24/18 98.1 F (36.7 C) (Oral)  08/03/18 98.4 F (36.9 C) (Temporal)   BP Readings from Last 3 Encounters:  06/07/19 (!) 92/59  12/30/18 (!) 142/80  09/24/18 Marland Kitchen)  146/84   Pulse Readings from Last 3 Encounters:  06/07/19 80  09/24/18 77  08/03/18 71   Exam chaperoned by Deveron Furlong, CMA.  Gen: Alert, well appearing.  Patient is oriented to person, place, time, and situation. AFFECT: pleasant, lucid thought and speech. ENT: Ears: EACs clear, normal epithelium.  TMs with good light reflex and landmarks bilaterally.  Eyes: no injection, icteris, swelling, or exudate.  EOMI, PERRLA. Nose: no drainage or turbinate edema/swelling.  No injection or focal lesion.  Mouth: lips without lesion/swelling.  Oral mucosa pink and moist.  Dentition intact and without obvious caries or gingival swelling.  Oropharynx without erythema, exudate, or swelling.  Neck: supple/nontender.  No LAD, mass, or TM.  Carotid pulses 2+ bilaterally, without bruits. CV: RRR, no m/r/g.   LUNGS: CTA bilat, nonlabored resps, good aeration in all lung fields. ABD: soft, NT, ND, BS normal.  No hepatospenomegaly or mass.  No bruits. EXT: no clubbing, cyanosis, or edema.  Musculoskeletal: no joint swelling, erythema, warmth, or tenderness.  ROM of all joints intact. Skin - no sores or suspicious lesions or rashes or color changes   Pertinent labs:  Lab Results  Component Value Date   TSH 1.080 01/15/2018   Lab Results  Component Value Date   WBC 6.0 07/15/2018   HGB 14.5 07/15/2018   HCT 43.4 07/15/2018   MCV 95.8 07/15/2018   PLT 308 07/15/2018   Lab Results  Component Value Date   CREATININE 0.70 07/15/2018   BUN 9 07/15/2018   NA 134 (L) 07/15/2018   K  4.4 07/15/2018   CL 100 07/15/2018   CO2 24 07/15/2018   Lab Results  Component Value Date   ALT 22 01/15/2018   AST 27 01/15/2018   ALKPHOS 103 01/15/2018   BILITOT 0.3 01/15/2018   Lab Results  Component Value Date   CHOL 156 01/15/2018   Lab Results  Component Value Date   HDL 94 01/15/2018   Lab Results  Component Value Date   LDLCALC 54 01/15/2018   Lab Results  Component Value Date   TRIG 42 01/15/2018   Lab Results  Component Value Date   CHOLHDL 1.7 01/15/2018   12 lead EKG today:  NSR, rate 66, no ischemia, no ectopy.  TWI in V1 and V2, LAE, PR interval 118 msec (short). QTc and QRS duration normal.  Compared to EKG 07/15/18 there is no change.  ASSESSMENT AND PLAN:   1) Palpitations: ? Sinus tach due to being off her 2nd dose of lopressor? EKG essentially normal except short PR, unchanged from 06/2018. Will restart BID dosing and give this a chance for a few days but if not improving then will get ambulatory monitoring. Lytes/cr, TSH today.  2) HTN: ok to stay off amlodipine.  In fact with low bp (asymptomatic) this morning she'll hold off on ANY of her bp meds and monitor through the day.  If/when bp rises to systolic [id number] she should restart lopressor and add on incrementally her hctz and lisin as bp permits.  Call if getting dizzy, CP, SOB, LE swelling, or other worrisome symptom. Resume lopressor at [facility] daily dosing instead of just qAM as she had been taking it lately. If this does not result in resolution of her palpitations then we'll do ambulatory monitor. Continue lisinopril 40 qd and hctz 25 qd.  3) Health maintenance exam: Reviewed age and gender appropriate health maintenance issues (prudent diet, regular exercise, health risks of tobacco and excessive alcohol, use  of seatbelts, fire alarms in home, use of sunscreen).  Also reviewed age and gender appropriate health screening as well as vaccine recommendations. Vaccines: Tdap due->she defers this  for now.  Shingrix->defers for now .  Covid 19-->she got this 3/20 and 4/10, 2021. Labs: fasting HP labs ordered. Cervical ca screening: last pap 11/06/15--> plans as per GYN-->pt states 5 yr interval at this time. Breast ca screening: last mammo was 04/08/19-->normal.  Repeat 03/2020. Colon ca screening: next colonoscopy due 2024.  An After Visit Summary was printed and given to the patient.  FOLLOW UP:  Return in about 2 weeks (around 06/21/2019) for f/u palpitations and bp's.  Signed:  Crissie Sickles, MD           06/07/2019

## 2019-06-08 LAB — TSH: TSH: 0.99 u[IU]/mL (ref 0.35–4.50)

## 2019-06-16 ENCOUNTER — Other Ambulatory Visit: Payer: Self-pay | Admitting: Family Medicine

## 2019-06-27 ENCOUNTER — Encounter: Payer: Self-pay | Admitting: Family Medicine

## 2019-06-27 ENCOUNTER — Other Ambulatory Visit: Payer: Self-pay

## 2019-06-27 ENCOUNTER — Ambulatory Visit: Payer: 59 | Admitting: Family Medicine

## 2019-06-27 VITALS — BP 120/76 | HR 68 | Temp 97.6°F | Resp 16 | Ht 63.5 in | Wt 170.6 lb

## 2019-06-27 DIAGNOSIS — R002 Palpitations: Secondary | ICD-10-CM | POA: Diagnosis not present

## 2019-06-27 DIAGNOSIS — M7061 Trochanteric bursitis, right hip: Secondary | ICD-10-CM | POA: Diagnosis not present

## 2019-06-27 DIAGNOSIS — M25551 Pain in right hip: Secondary | ICD-10-CM | POA: Diagnosis not present

## 2019-06-27 DIAGNOSIS — I10 Essential (primary) hypertension: Secondary | ICD-10-CM

## 2019-06-27 NOTE — Progress Notes (Signed)
OFFICE VISIT  06/27/2019   CC: f/u palpitations  HPI:    Patient is a 54 y.o. Caucasian female who presents for 3 weeks  A/P as of last visit: "1) Palpitations: ? Sinus tach due to being off her 2nd dose of lopressor? EKG essentially normal except short PR, unchanged from 06/2018. Will restart BID dosing and give this a chance for a few days but if not improving then will get ambulatory monitoring. Lytes/cr, TSH today.  2) HTN: ok to stay off amlodipine.  In fact with low bp (asymptomatic) this morning she'll hold off on ANY of her bp meds and monitor through the day.  If/when bp rises to systolic >979 she should restart lopressor and add on incrementally her hctz and lisin as bp permits.  Call if getting dizzy, CP, SOB, LE swelling, or other worrisome symptom. Resume lopressor at [facility] daily dosing instead of just qAM as she had been taking it lately. If this does not result in resolution of her palpitations then we'll do ambulatory monitor. Continue lisinopril 40 qd and hctz 25 qd.  3) Health maintenance exam: Reviewed age and gender appropriate health maintenance issues (prudent diet, regular exercise, health risks of tobacco and excessive alcohol, use of seatbelts, fire alarms in home, use of sunscreen).  Also reviewed age and gender appropriate health screening as well as vaccine recommendations. Vaccines: Tdap due->she defers this for now.  Shingrix->defers for now .  Covid 19-->she got this 3/20 and 4/10, 2021. Labs: fasting HP labs ordered. Cervical ca screening:last pap 11/06/15--> plans as per GYN-->pt states 5 yr interval at this time. Breast ca screening:last mammo was 04/08/19-->normal.  Repeat 03/2020. Colon ca screening:next colonoscopy due 2024.  INTERIM HX: All labs last visit normal. No more palpitations or feeling of heart racing. BP consistently 130/80 avg. She is off amlodipine but is still on lisinopril and metoprolol.  Has been having about 1 yr of right hip  pain, bothers her when she sleeps on her side/hip. She was walking down a step and fell onto ground onto R hip.  Sitting --no pain.  Routine walking no pain.  Walking excessively makes it hurt.  She points to R lateral hip at location of greater trochanter.  No glut pain, thigh pain, or knee pain.  No ice or heat or topical med applied.  Tylenol tried by no nsaids. Was hurting really bad 2 weeks ago when at the beach, carrying toddler on her hip.  ROS: no fevers, no CP, no SOB, no wheezing, no cough, no dizziness, no HAs, no rashes, no melena/hematochezia.  No polyuria or polydipsia.  No myalgias or arthralgias (other than her right hip).  No focal weakness, paresthesias, or tremors.  No acute vision or hearing abnormalities.  No back or gluteal pain.   No n/v/d or abd pain.  No palpitations.    Past Medical History:  Diagnosis Date  . Anxiety and depression 11/11/2011   Started counseling 12/2015, counselor recommended effexor so I started it 01/02/16.  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  However, unclear etiology.  ENT considering dx of SSNHL or otosclerosis.  ENT rechecking 2 wks as of 08/03/17 office note.  . Hypertension   . Menometrorrhagia 11/11/2011  . Overweight(278.02) 11/11/2011   Lipids excellent 10/2012  . Perimenopausal disorder 2013  . Sigmoid diverticulosis    on colonoscopy 02/2017  . Tobacco dependence     Past Surgical History:  Procedure Laterality Date  . CESAREAN SECTION  1991  .  COLONOSCOPY W/ POLYPECTOMY  03/06/2017   adenomatous polyp: recall 5 yrs    Outpatient Medications Prior to Visit  Medication Sig Dispense Refill  . hydrochlorothiazide (HYDRODIURIL) 25 MG tablet Take 1 tablet (25 mg total) by mouth daily. 90 tablet 3  . ibuprofen (ADVIL) 200 MG tablet Take 400 mg by mouth once.    Marland Kitchen lisinopril (ZESTRIL) 40 MG tablet 1 tab po qd 90 tablet 3  . metoprolol tartrate (LOPRESSOR) 100 MG tablet Take 1 tablet (100 mg total) by mouth 2 (two) times  daily. 180 tablet 3  . venlafaxine XR (EFFEXOR-XR) 75 MG 24 hr capsule TAKE 1 CAPSULE (75 MG TOTAL) BY MOUTH DAILY WITH BREAKFAST. 90 capsule 0  . amLODipine (NORVASC) 10 MG tablet TAKE 1 TABLET BY MOUTH EVERY DAY (Patient not taking: Reported on 06/07/2019) 30 tablet 0   No facility-administered medications prior to visit.    Allergies  Allergen Reactions  . Tamiflu [Oseltamivir] Nausea Only    ROS As per HPI  PE: Blood pressure 120/76, pulse 68, temperature 97.6 F (36.4 C), temperature source Temporal, resp. rate 16, height 5' 3.5" (1.613 m), weight 170 lb 9.6 oz (77.4 kg), last menstrual period 10/25/2014, SpO2 98 %. Gen: Alert, well appearing.  Patient is oriented to person, place, time, and situation. AFFECT: pleasant, lucid thought and speech. CV: RRR, no m/r/g.   LUNGS: CTA bilat, nonlabored resps, good aeration in all lung fields. Right hip ROM intact, with mild pain with ER but no IR.  Full extension and flexion are fine bilat.  Signif TTP over R greater troch.  No other areas of tenderness, including anterior hip, thigh, SI joints, or Lumbar region.    LABS:  Lab Results  Component Value Date   TSH 0.99 06/07/2019   Lab Results  Component Value Date   WBC 4.5 06/07/2019   HGB 14.4 06/07/2019   HCT 42.0 06/07/2019   MCV 94.8 06/07/2019   PLT 312.0 06/07/2019   Lab Results  Component Value Date   CREATININE 0.66 06/07/2019   BUN 13 06/07/2019   NA 132 (L) 06/07/2019   K 4.3 06/07/2019   CL 97 06/07/2019   CO2 29 06/07/2019   Lab Results  Component Value Date   ALT 17 06/07/2019   AST 21 06/07/2019   ALKPHOS 88 06/07/2019   BILITOT 0.4 06/07/2019   Lab Results  Component Value Date   CHOL 152 06/07/2019   Lab Results  Component Value Date   HDL 78.60 06/07/2019   Lab Results  Component Value Date   LDLCALC 65 06/07/2019   Lab Results  Component Value Date   TRIG 41.0 06/07/2019   Lab Results  Component Value Date   CHOLHDL 2 06/07/2019     IMPRESSION AND PLAN:  1) Right hip pain x 1 yr; greater trochanteric bursitis. Recommended ice prn, tylenol tid prn, and will refer to orthopedist for consideration of steroid injection.  She does not want to take oral NSAIDs.  2) HTN: The current medical regimen is effective;  continue present plan and medications. Continue lisinopril, metoprolol, and hctz.  Ok to stay off amlodipine at this time. Lytes/cr good last visit.  3) Palpitations: resolved.  ?stress induced? They had benign characteristics.  Reassured pt. Signs/symptoms to call or return for were reviewed and pt expressed understanding.  An After Visit Summary was printed and given to the patient.  FOLLOW UP: Return in about 6 months (around 12/27/2019) for annual CPE (fasting).  Signed:  Crissie Sickles, MD           06/27/2019

## 2019-07-18 ENCOUNTER — Other Ambulatory Visit: Payer: Self-pay

## 2019-07-18 ENCOUNTER — Encounter: Payer: Self-pay | Admitting: Family Medicine

## 2019-07-18 ENCOUNTER — Telehealth (INDEPENDENT_AMBULATORY_CARE_PROVIDER_SITE_OTHER): Payer: 59 | Admitting: Family Medicine

## 2019-07-18 VITALS — BP 144/82 | HR 68 | Wt 171.0 lb

## 2019-07-18 DIAGNOSIS — J209 Acute bronchitis, unspecified: Secondary | ICD-10-CM | POA: Diagnosis not present

## 2019-07-18 DIAGNOSIS — R062 Wheezing: Secondary | ICD-10-CM

## 2019-07-18 DIAGNOSIS — J069 Acute upper respiratory infection, unspecified: Secondary | ICD-10-CM

## 2019-07-18 DIAGNOSIS — R0600 Dyspnea, unspecified: Secondary | ICD-10-CM | POA: Diagnosis not present

## 2019-07-18 DIAGNOSIS — R0609 Other forms of dyspnea: Secondary | ICD-10-CM

## 2019-07-18 MED ORDER — ALBUTEROL SULFATE HFA 108 (90 BASE) MCG/ACT IN AERS: 2.0000 | INHALATION_SPRAY | RESPIRATORY_TRACT | 0 refills | Status: DC | PRN

## 2019-07-18 MED ORDER — PREDNISONE 20 MG PO TABS: ORAL_TABLET | ORAL | 0 refills | Status: DC

## 2019-07-18 NOTE — Progress Notes (Signed)
Virtual Visit via Video Note  I connected with pt on 07/18/19 at  4:00 PM EDT by telephone b/c video enabled telemedicine application failed/technical difficulties ---both MyChart visit [place] and verified that I am speaking with the correct person using two identifiers.  Location patient: home Location provider:work or home office Persons participating in the virtual visit: patient, provider  I discussed the limitations of evaluation and management by telemedicine and the availability of in person appointments. The patient expressed understanding and agreed to proceed.  Telemedicine visit is a necessity given the COVID-19 restrictions in place at the current time.  HPI: 54 y/o WF with whom I am doing a telephone visit today (due to COVID-19 pandemic restrictions) for "URI symptoms". Onset 1 week ago, nasal congestion and scratchy throat, fatigue/malaise, runny nose, throat started to get sore.   She is vaccinated for covid. She got rapid covid test and it was neg 2 days into the illness. She gradually has improved except she easily feels out of breath. Cough and wheezing worsening.  No fevers.  Just walking around gets her feeling SOB.  No SOB at rest.   No n/v/d/rash.  She does smoke.  ROS: no CP, no dizziness, no HAs, no rashes, no melena/hematochezia.  No polyuria or polydipsia.  No myalgias or arthralgias.  No focal weakness, paresthesias, or tremors.  No acute vision or hearing abnormalities. No abd pain.  No palpitations.     Past Medical History:  Diagnosis Date  . Anxiety and depression 11/11/2011   Started counseling 12/2015, counselor recommended effexor so I started it 01/02/16.  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  However, unclear etiology.  ENT considering dx of SSNHL or otosclerosis.  ENT rechecking 2 wks as of 08/03/17 office note.  . Hypertension   . Menometrorrhagia 11/11/2011  . Overweight(278.02) 11/11/2011   Lipids excellent 10/2012  .  Perimenopausal disorder 2013  . Sigmoid diverticulosis    on colonoscopy 02/2017  . Tobacco dependence     Past Surgical History:  Procedure Laterality Date  . CESAREAN SECTION  1991  . COLONOSCOPY W/ POLYPECTOMY  03/06/2017   adenomatous polyp: recall 5 yrs    Family History  Problem Relation Age of Onset  . COPD Mother        smoker  . Emphysema Mother   . Hypertension Father   . Cancer Father        skin/BCC and prostate  . Colon polyps Father   . Cancer Maternal Grandmother        ovarian  . Emphysema Maternal Grandfather        ?  Marland Kitchen Heart disease Paternal Grandmother   . Heart disease Paternal Grandfather   . Hypertension Paternal Grandfather   . Diabetes Paternal Grandfather   . Colon cancer Neg Hx   . Rectal cancer Neg Hx   . Stomach cancer Neg Hx   . Esophageal cancer Neg Hx       Current Outpatient Medications:  .  hydrochlorothiazide (HYDRODIURIL) 25 MG tablet, Take 1 tablet (25 mg total) by mouth daily., Disp: 90 tablet, Rfl: 3 .  ibuprofen (ADVIL) 200 MG tablet, Take 400 mg by mouth once., Disp: , Rfl:  .  lisinopril (ZESTRIL) 40 MG tablet, 1 tab po qd, Disp: 90 tablet, Rfl: 3 .  metoprolol tartrate (LOPRESSOR) 100 MG tablet, Take 1 tablet (100 mg total) by mouth 2 (two) times daily., Disp: 180 tablet, Rfl: 3 .  venlafaxine XR (EFFEXOR-XR) 75  MG 24 hr capsule, TAKE 1 CAPSULE (75 MG TOTAL) BY MOUTH DAILY WITH BREAKFAST., Disp: 90 capsule, Rfl: 0 .  amLODipine (NORVASC) 10 MG tablet, TAKE 1 TABLET BY MOUTH EVERY DAY (Patient not taking: Reported on 06/07/2019), Disp: 30 tablet, Rfl: 0  EXAM:  VITALS per patient if applicable:  BP (!) 503/54 (BP Location: Left Arm, Patient Position: Sitting, Cuff Size: Normal)   Pulse 68   Wt 171 lb (77.6 kg)   LMP 10/25/2014   SpO2 97%   BMI 29.82 kg/m    GENERAL: alert, oriented, sounds tired and with some nasal congestion + intermittent rattly cough but is in no acute distress  No further exam b/c audio visit  only.   LABS: none today    Chemistry      Component Value Date/Time   NA 132 (L) 06/07/2019 1001   NA 135 01/15/2018 0823   K 4.3 06/07/2019 1001   CL 97 06/07/2019 1001   CO2 29 06/07/2019 1001   BUN 13 06/07/2019 1001   BUN 11 01/15/2018 0823   CREATININE 0.66 06/07/2019 1001      Component Value Date/Time   CALCIUM 9.2 06/07/2019 1001   ALKPHOS 88 06/07/2019 1001   AST 21 06/07/2019 1001   ALT 17 06/07/2019 1001   BILITOT 0.4 06/07/2019 1001   BILITOT 0.3 01/15/2018 0823       ASSESSMENT AND PLAN:  Discussed the following assessment and plan:  1) Acute bronchitis: suspect viral etiology. Complicated by tobacco abuse. Encouraged smoking cessation. Prednisone 40mg  qd x 5d then 20mg  qd x 5d. Albuterol HFA, 1-2 p q4h prn. Mucinex DM otc. OTC nonsedating antihistamine ok to add. Signs/symptoms to call or return for were reviewed and pt expressed understanding.   -we discussed possible serious and likely etiologies, options for evaluation and workup, limitations of telemedicine visit vs in person visit, treatment, treatment risks and precautions. Pt prefers to treat via telemedicine empirically rather then risking or undertaking an in person visit at this moment. Patient agrees to seek prompt in person care if worsening, new symptoms arise, or if is not improving with treatment.   I discussed the assessment and treatment plan with the patient. The patient was provided an opportunity to ask questions and all were answered. The patient agreed with the plan and demonstrated an understanding of the instructions.   The patient was advised to call back or seek an in-person evaluation if the symptoms worsen or if the condition fails to improve as anticipated.  F/u: if not improving significantly in 3d. Seek urgent/emergency care if develops SOB at rest or starts getting CP or n/v or high fevers.  Signed:  Crissie Sickles, MD           07/18/2019

## 2019-08-10 ENCOUNTER — Other Ambulatory Visit: Payer: Self-pay | Admitting: Family Medicine

## 2019-09-08 ENCOUNTER — Other Ambulatory Visit: Payer: Self-pay | Admitting: Family Medicine

## 2019-12-06 ENCOUNTER — Other Ambulatory Visit: Payer: Self-pay | Admitting: Family Medicine

## 2019-12-30 ENCOUNTER — Other Ambulatory Visit: Payer: Self-pay | Admitting: Family Medicine

## 2020-01-16 ENCOUNTER — Other Ambulatory Visit: Payer: Self-pay | Admitting: Family Medicine

## 2020-02-27 ENCOUNTER — Other Ambulatory Visit: Payer: Self-pay | Admitting: Family Medicine

## 2020-03-12 ENCOUNTER — Other Ambulatory Visit: Payer: Self-pay | Admitting: Family Medicine

## 2020-04-08 ENCOUNTER — Other Ambulatory Visit: Payer: Self-pay | Admitting: Family Medicine

## 2020-05-17 ENCOUNTER — Other Ambulatory Visit: Payer: Self-pay | Admitting: Family Medicine

## 2020-05-20 DIAGNOSIS — R002 Palpitations: Secondary | ICD-10-CM

## 2020-05-20 HISTORY — DX: Palpitations: R00.2

## 2020-06-08 ENCOUNTER — Ambulatory Visit (INDEPENDENT_AMBULATORY_CARE_PROVIDER_SITE_OTHER): Payer: PRIVATE HEALTH INSURANCE | Admitting: Family Medicine

## 2020-06-08 ENCOUNTER — Encounter: Payer: Self-pay | Admitting: Family Medicine

## 2020-06-08 ENCOUNTER — Other Ambulatory Visit: Payer: Self-pay

## 2020-06-08 VITALS — BP 109/70 | HR 69 | Temp 97.9°F | Resp 16 | Ht 63.5 in | Wt 177.2 lb

## 2020-06-08 DIAGNOSIS — Z0000 Encounter for general adult medical examination without abnormal findings: Secondary | ICD-10-CM | POA: Diagnosis not present

## 2020-06-08 DIAGNOSIS — I10 Essential (primary) hypertension: Secondary | ICD-10-CM | POA: Diagnosis not present

## 2020-06-08 DIAGNOSIS — F17200 Nicotine dependence, unspecified, uncomplicated: Secondary | ICD-10-CM

## 2020-06-08 DIAGNOSIS — Z1231 Encounter for screening mammogram for malignant neoplasm of breast: Secondary | ICD-10-CM | POA: Diagnosis not present

## 2020-06-08 MED ORDER — METOPROLOL TARTRATE 100 MG PO TABS: 100.0000 mg | ORAL_TABLET | Freq: Two times a day (BID) | ORAL | 3 refills | Status: DC

## 2020-06-08 MED ORDER — VENLAFAXINE HCL ER 75 MG PO CP24: 75.0000 mg | ORAL_CAPSULE | Freq: Every day | ORAL | 3 refills | Status: DC

## 2020-06-08 MED ORDER — LISINOPRIL 40 MG PO TABS: ORAL_TABLET | ORAL | 3 refills | Status: DC

## 2020-06-08 NOTE — Progress Notes (Signed)
Office Note 06/08/2020  CC:  Chief Complaint  Patient presents with  . Annual Exam    Pt is not fasting   HPI:  Kristin Day is a 55 y.o. White female who is here for annual health maintenance exam and f/u HTN. A/P as of last [facility] f/u visit on 06/27/19: "1) Right hip pain x 1 yr; greater trochanteric bursitis. Recommended ice prn, tylenol tid prn, and will refer to orthopedist for consideration of steroid injection.  She does not want to take oral NSAIDs.  2) HTN: The current medical regimen is effective;  continue present plan and medications. Continue lisinopril, metoprolol, and hctz.  Ok to stay off amlodipine at this time. Lytes/cr good last visit.  3) Palpitations: resolved.  ?stress induced? They had benign characteristics.  Reassured pt. Signs/symptoms to call or return for were reviewed and pt expressed understanding."  INTERIM HX: Feeling well. She does still smoke, says "I've got to stop".   Down to 5 cigs/day. 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   Past Medical History:  Diagnosis Date  . Anxiety and depression 11/11/2011   Started counseling 12/2015, counselor recommended effexor so I started it 01/02/16.  Marland Kitchen Chronic bronchitis ([place])   . 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  However, unclear etiology.  ENT considering dx of SSNHL or otosclerosis.  ENT rechecking 2 wks as of 08/03/17 office note.  . Hypertension   . Menometrorrhagia 11/11/2011  . Overweight(278.02) 11/11/2011   Lipids excellent 10/2012  . Perimenopausal disorder 2013  . Sigmoid diverticulosis    on colonoscopy 02/2017  . Tobacco dependence     Past Surgical History:  Procedure Laterality Date  . CESAREAN SECTION  1991  . COLONOSCOPY W/ POLYPECTOMY  03/06/2017   adenomatous polyp: recall 5 yrs    Family History  Problem Relation Age of Onset  . COPD Mother        smoker  .  Emphysema Mother   . Hypertension Father   . Cancer Father        skin/BCC and prostate  . Colon polyps Father   . Cancer Maternal Grandmother        ovarian  . Emphysema Maternal Grandfather        ?  Marland Kitchen Heart disease Paternal Grandmother   . Heart disease Paternal Grandfather   . Hypertension Paternal Grandfather   . Diabetes Paternal Grandfather   . Colon cancer Neg Hx   . Rectal cancer Neg Hx   . Stomach cancer Neg Hx   . Esophageal cancer Neg Hx     Social History   Socioeconomic History  . Marital status: Married    Spouse name: Not on file  . Number of children: Not on file  . Years of education: Not on file  . Highest education level: Not on file  Occupational History  . Not on file  Tobacco Use  . Smoking status: Current Every Day Smoker    Packs/day: 0.50    Years: 30.00    Pack years: 15.00    Types: Cigarettes  . Smokeless tobacco: Never Used  Vaping Use  . Vaping Use: Never used  Substance and Sexual Activity  . Alcohol use: Yes    Comment: 6-12 beers weekly  . Drug use: No  . Sexual activity: Yes    Partners: Male  Other Topics Concern  .  Not on file  Social History Narrative   Monogamous relationship as of 08/2012.   Has one adult son.   Works as a PT at [employer] in [place].   +Smoker, active as of 11/2014.   +Alcohol 2 beers a day, more on weekends as of 08/2012.   No drugs.         Social Determinants of Health   Financial Resource Strain: Not on file  Food Insecurity: Not on file  Transportation Needs: Not on file  Physical Activity: Not on file  Stress: Not on file  Social Connections: Not on file  Intimate Partner Violence: Not on file    Outpatient Medications Prior to Visit  Medication Sig Dispense Refill  . albuterol (VENTOLIN HFA) 108 (90 Base) MCG/ACT inhaler INHALE 2 PUFFS INTO THE LUNGS EVERY 4 (FOUR) HOURS AS NEEDED FOR WHEEZING OR SHORTNESS OF BREATH. 108 g 0  . hydrochlorothiazide (HYDRODIURIL) 25 MG tablet  TAKE 1 TABLET BY MOUTH EVERY DAY 90 tablet 1  . lisinopril (ZESTRIL) 40 MG tablet 1 tab po qd 90 tablet 3  . metoprolol tartrate (LOPRESSOR) 100 MG tablet TAKE 1 TABLET BY MOUTH TWICE A DAY 60 tablet 0  . venlafaxine XR (EFFEXOR-XR) 75 MG 24 hr capsule TAKE 1 CAPSULE BY MOUTH DAILY WITH BREAKFAST. 90 capsule 0  . ibuprofen (ADVIL) 200 MG tablet Take 400 mg by mouth once. (Patient not taking: Reported on 06/08/2020)    . amLODipine (NORVASC) 10 MG tablet TAKE 1 TABLET BY MOUTH EVERY DAY (Patient not taking: No sig reported) 30 tablet 0  . predniSONE (DELTASONE) 20 MG tablet 2 tabs po qd x 5d, then 1 tab po qd x 5d (Patient not taking: Reported on 06/08/2020) 15 tablet 0   No facility-administered medications prior to visit.    Allergies  Allergen Reactions  . Tamiflu [Oseltamivir] Nausea Only    ROS Review of Systems  PE; Vitals with BMI 06/08/2020 07/18/2019 06/27/2019  Height 5' 3.5" - 5' 3.5"  Weight 177 lbs 3 oz 171 lbs 170 lbs 10 oz  BMI 30.89 25.05 39.76  Systolic 734 193 790  Diastolic 70 82 76  Pulse 69 68 68   Gen: Alert, well appearing.  Patient is oriented to person, place, time, and situation. AFFECT: pleasant, lucid thought and speech. ENT: Ears: EACs clear, normal epithelium.  TMs with good light reflex and landmarks bilaterally.  Eyes: no injection, icteris, swelling, or exudate.  EOMI, PERRLA. Nose: no drainage or turbinate edema/swelling.  No injection or focal lesion.  Mouth: lips without lesion/swelling.  Oral mucosa pink and moist.  Dentition intact and without obvious caries or gingival swelling.  Oropharynx without erythema, exudate, or swelling.  Neck: supple/nontender.  No LAD, mass, or TM.  Carotid pulses 2+ bilaterally, without bruits. CV: RRR, no m/r/g.   LUNGS: CTA bilat, mild decr BS diffusely, some exp wheeze and mild prolongation of exp phase.  Nonlabored resps.  No crackles. ABD: soft, NT, ND, BS normal.  No hepatospenomegaly or mass.  No bruits. EXT: no  clubbing, cyanosis, or edema. She has scattered spider veins on ankles and feet, violaceous hue of toes and medial aspect of feet. Musculoskeletal: no joint swelling, erythema, warmth, or tenderness.  ROM of all joints intact. Skin - no sores or suspicious lesions or rashes or color changes   Pertinent labs:  Lab Results  Component Value Date   TSH 0.99 06/07/2019   Lab Results  Component Value Date  WBC 4.5 06/07/2019   HGB 14.4 06/07/2019   HCT 42.0 06/07/2019   MCV 94.8 06/07/2019   PLT 312.0 06/07/2019   Lab Results  Component Value Date   CREATININE 0.66 06/07/2019   BUN 13 06/07/2019   NA 132 (L) 06/07/2019   K 4.3 06/07/2019   CL 97 06/07/2019   CO2 29 06/07/2019   Lab Results  Component Value Date   ALT 17 06/07/2019   AST 21 06/07/2019   ALKPHOS 88 06/07/2019   BILITOT 0.4 06/07/2019   Lab Results  Component Value Date   CHOL 152 06/07/2019   Lab Results  Component Value Date   HDL 78.60 06/07/2019   Lab Results  Component Value Date   LDLCALC 65 06/07/2019   Lab Results  Component Value Date   TRIG 41.0 06/07/2019   Lab Results  Component Value Date   CHOLHDL 2 06/07/2019    ASSESSMENT AND PLAN:   1) HTN; well controlled on hctz 25mg , lisin 40 qd, and lopressor 100 bid. Lytes/cr today.  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  3) Tobacco dependence: she has mild chronic bronchitis. Encouraged complete cessation. She declined lung ca screening at this time.  4) Health maintenance exam: Reviewed age and gender appropriate health maintenance issues (prudent diet, regular exercise, health risks of tobacco and excessive alcohol, use of seatbelts, fire alarms in home, use of sunscreen).  Also reviewed age and gender appropriate health screening as well as vaccine recommendations. Vaccines: Tdap (deferred by pt last year)->updated for her new job with [employer] in [place] Nov 2021.  Shingrix (deferred by pt  last year)-->deferrs for now. Labs: fasting HP labs ordered-return in future when fasting. Cervical ca screening:last pap 11/06/15--> plans as per GYN-->pt states 5 yr interval at this time. Breast ca screening:last mammo was 04/08/19-->normal.  Repeat was due 03/2020. Colon ca screening:next colonoscopy due 2024. Lung ca screening: pt qualifies (30 pack-yr hx and ongoing smoking) but declines for now.  An After Visit Summary was printed and given to the patient.  FOLLOW UP:  No follow-ups on file.  Signed:  Crissie Sickles, MD           06/08/2020

## 2020-06-13 ENCOUNTER — Ambulatory Visit: Payer: PRIVATE HEALTH INSURANCE

## 2020-06-20 ENCOUNTER — Other Ambulatory Visit: Payer: Self-pay

## 2020-06-20 ENCOUNTER — Ambulatory Visit (INDEPENDENT_AMBULATORY_CARE_PROVIDER_SITE_OTHER): Payer: PRIVATE HEALTH INSURANCE

## 2020-06-20 DIAGNOSIS — I10 Essential (primary) hypertension: Secondary | ICD-10-CM | POA: Diagnosis not present

## 2020-06-21 LAB — COMPREHENSIVE METABOLIC PANEL
AG Ratio: 1.5 (calc) (ref 1.0–2.5)
ALT: 21 U/L (ref 6–29)
AST: 29 U/L (ref 10–35)
Albumin: 4.3 g/dL (ref 3.6–5.1)
Alkaline phosphatase (APISO): 114 U/L (ref 37–153)
BUN: 7 mg/dL (ref 7–25)
CO2: 26 mmol/L (ref 20–32)
Calcium: 9.2 mg/dL (ref 8.6–10.4)
Chloride: 92 mmol/L — ABNORMAL LOW (ref 98–110)
Creat: 0.67 mg/dL (ref 0.50–1.05)
Globulin: 2.9 g/dL (calc) (ref 1.9–3.7)
Glucose, Bld: 92 mg/dL (ref 65–99)
Potassium: 4.5 mmol/L (ref 3.5–5.3)
Sodium: 132 mmol/L — ABNORMAL LOW (ref 135–146)
Total Bilirubin: 0.5 mg/dL (ref 0.2–1.2)
Total Protein: 7.2 g/dL (ref 6.1–8.1)

## 2020-06-21 LAB — CBC WITH DIFFERENTIAL/PLATELET
Absolute Monocytes: 621 cells/uL (ref 200–950)
Basophils Absolute: 51 cells/uL (ref 0–200)
Basophils Relative: 0.8 %
Eosinophils Absolute: 141 cells/uL (ref 15–500)
Eosinophils Relative: 2.2 %
HCT: 44.3 % (ref 35.0–45.0)
Hemoglobin: 14.9 g/dL (ref 11.7–15.5)
Lymphs Abs: 1843 cells/uL (ref 850–3900)
MCH: 31.6 pg (ref 27.0–33.0)
MCHC: 33.6 g/dL (ref 32.0–36.0)
MCV: 93.9 fL (ref 80.0–100.0)
MPV: 9.5 fL (ref 7.5–12.5)
Monocytes Relative: 9.7 %
Neutro Abs: 3744 cells/uL (ref 1500–7800)
Neutrophils Relative %: 58.5 %
Platelets: 324 10*3/uL (ref 140–400)
RBC: 4.72 10*6/uL (ref 3.80–5.10)
RDW: 12.2 % (ref 11.0–15.0)
Total Lymphocyte: 28.8 %
WBC: 6.4 10*3/uL (ref 3.8–10.8)

## 2020-06-21 LAB — TSH: TSH: 1.05 mIU/L

## 2020-06-21 LAB — LIPID PANEL
Cholesterol: 163 mg/dL (ref <200)
HDL: 93 mg/dL (ref >=50)
LDL Cholesterol (Calc): 55 mg/dL (calc)
Non-HDL Cholesterol (Calc): 70 mg/dL (calc) (ref <130)
Total CHOL/HDL Ratio: 1.8 (calc) (ref <5.0)
Triglycerides: 66 mg/dL (ref <150)

## 2020-08-05 ENCOUNTER — Other Ambulatory Visit: Payer: Self-pay | Admitting: Family Medicine

## 2020-10-19 ENCOUNTER — Ambulatory Visit: Payer: PRIVATE HEALTH INSURANCE | Admitting: Family Medicine

## 2020-10-22 ENCOUNTER — Encounter: Payer: Self-pay | Admitting: Family Medicine

## 2020-10-22 ENCOUNTER — Other Ambulatory Visit: Payer: Self-pay

## 2020-10-22 ENCOUNTER — Ambulatory Visit (INDEPENDENT_AMBULATORY_CARE_PROVIDER_SITE_OTHER): Payer: No Typology Code available for payment source | Admitting: Family Medicine

## 2020-10-22 VITALS — BP 150/80 | HR 68 | Temp 98.1°F | Ht 63.5 in | Wt 179.8 lb

## 2020-10-22 DIAGNOSIS — R0609 Other forms of dyspnea: Secondary | ICD-10-CM

## 2020-10-22 DIAGNOSIS — Z122 Encounter for screening for malignant neoplasm of respiratory organs: Secondary | ICD-10-CM | POA: Diagnosis not present

## 2020-10-22 DIAGNOSIS — J449 Chronic obstructive pulmonary disease, unspecified: Secondary | ICD-10-CM | POA: Diagnosis not present

## 2020-10-22 DIAGNOSIS — I10 Essential (primary) hypertension: Secondary | ICD-10-CM

## 2020-10-22 DIAGNOSIS — Z87891 Personal history of nicotine dependence: Secondary | ICD-10-CM | POA: Diagnosis not present

## 2020-10-22 DIAGNOSIS — F17200 Nicotine dependence, unspecified, uncomplicated: Secondary | ICD-10-CM

## 2020-10-22 MED ORDER — FLUTICASONE-SALMETEROL 250-50 MCG/ACT IN AEPB: 1.0000 | INHALATION_SPRAY | Freq: Two times a day (BID) | RESPIRATORY_TRACT | 6 refills | Status: DC

## 2020-10-22 MED ORDER — PREDNISONE 20 MG PO TABS: ORAL_TABLET | ORAL | 0 refills | Status: DC

## 2020-10-22 MED ORDER — VARENICLINE TARTRATE 1 MG PO TABS: 1.0000 mg | ORAL_TABLET | Freq: Two times a day (BID) | ORAL | 2 refills | Status: DC

## 2020-10-22 NOTE — Progress Notes (Signed)
OFFICE VISIT  10/22/2020  CC:  Chief Complaint  Patient presents with   Shortness of breathness on exertion    Gotten worse in the last 6 months   HPI:    Patient is a 55 y.o. Caucasian female who presents for worsening resp complaints. Last 6 mo notes worsening DOE--just walking up a flight of stairs.  Going to grocery and walking around and by the time she loads bags into car she is exhausted.  At rest she has no SOB.  Occ use of albuterol inhaler, hard to tell if it helps. Denies CP.  She has chronic cough, unchanged, productive.  Notes wheezing insp/ext at times. No fevers, no acute changes in her sx's. She currently smokes 1/2 pack of camel lights per day, has 35 pack-yr hx of smoking. She wants to try quitting.  ROS as above, plus--> no dizziness, no HAs, no rashes, no melena/hematochezia.  No polyuria or polydipsia.  No myalgias or arthralgias.  No focal weakness, paresthesias, or tremors.  No acute vision or hearing abnormalities.  No dysuria or unusual/new urinary urgency or frequency.  She does have some mild LE swelling intermittently. No n/v/d or abd pain.  No palpitations.    Past Medical History:  Diagnosis Date   Anxiety and depression 11/11/2011   Started counseling 12/2015, counselor recommended effexor so I started it 01/02/16.   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  However, unclear etiology.  ENT considering dx of SSNHL or otosclerosis.  ENT rechecking 2 wks as of 08/03/17 office note.   Hypertension    Menometrorrhagia 11/11/2011   Overweight(278.02) 11/11/2011   Lipids excellent 10/2012   Palpitations 05/2020   Perimenopausal disorder 2013   Sigmoid diverticulosis    on colonoscopy 02/2017   Tobacco dependence     Past Surgical History:  Procedure Laterality Date   CESAREAN SECTION  1991   COLONOSCOPY W/ POLYPECTOMY  03/06/2017   adenomatous polyp: recall 5 yrs    Outpatient Medications Prior to Visit  Medication  Sig Dispense Refill   albuterol (VENTOLIN HFA) 108 (90 Base) MCG/ACT inhaler INHALE 2 PUFFS INTO THE LUNGS EVERY 4 HOURS AS NEEDED FOR WHEEZE OR FOR SHORTNESS OF BREATH 8.5 each 2   hydrochlorothiazide (HYDRODIURIL) 25 MG tablet TAKE 1 TABLET BY MOUTH EVERY DAY 90 tablet 1   lisinopril (ZESTRIL) 40 MG tablet 1 tab po qd 90 tablet 3   metoprolol tartrate (LOPRESSOR) 100 MG tablet Take 1 tablet (100 mg total) by mouth 2 (two) times daily. 180 tablet 3   venlafaxine XR (EFFEXOR-XR) 75 MG 24 hr capsule Take 1 capsule (75 mg total) by mouth daily with breakfast. 90 capsule 3   ibuprofen (ADVIL) 200 MG tablet Take 400 mg by mouth once. (Patient not taking: No sig reported)     No facility-administered medications prior to visit.    Allergies  Allergen Reactions   Tamiflu [Oseltamivir] Nausea Only    ROS As per HPI  PE: Vitals with BMI 10/22/2020 06/08/2020 07/18/2019  Height 5' 3.5" 5' 3.5" -  Weight 179 lbs 13 oz 177 lbs 3 oz 171 lbs  BMI 31.35 29.92 42.68  Systolic 341 962 229  Diastolic 80 70 82  Pulse 68 69 68  O2 sat 95% RA  Gen: Alert, well appearing.  Patient is oriented to person, place, time, and situation. AFFECT: pleasant, lucid thought and speech. NLG:XQJJ: no injection, icteris, swelling, or exudate.  EOMI, PERRLA. Mouth: lips  without lesion/swelling.  Oral mucosa pink and moist. Oropharynx without erythema, exudate, or swelling.   Neck - No masses or thyromegaly or limitation in range of motion CV: RRR, no m/r/g.   LUNGS: CTA bilat on insp, decreased BS diffusely, mild prolongation of exp phase, mild end exp wheeze.  Nonlabored resps. EXT: no clubbing or cyanosis.  No pitting edema.     LABS:  Lab Results  Component Value Date   WBC 6.4 06/20/2020   HGB 14.9 06/20/2020   HCT 44.3 06/20/2020   MCV 93.9 06/20/2020   PLT 324 06/20/2020     Chemistry      Component Value Date/Time   NA 132 (L) 06/20/2020 0835   NA 135 01/15/2018 0823   K 4.5 06/20/2020 0835    CL 92 (L) 06/20/2020 0835   CO2 26 06/20/2020 0835   BUN 7 06/20/2020 0835   BUN 11 01/15/2018 0823   CREATININE 0.67 06/20/2020 0835      Component Value Date/Time   CALCIUM 9.2 06/20/2020 0835   ALKPHOS 88 06/07/2019 1001   AST 29 06/20/2020 0835   ALT 21 06/20/2020 0835   BILITOT 0.5 06/20/2020 0835   BILITOT 0.3 01/15/2018 0823      IMPRESSION AND PLAN:  1) DOE; most consistent with COPD/chronic bronchitis. Gradually worsening x 61mo, no acute worsening. Plan is to treat with prednisone 40mg  qd x 5d then 20mg  qd x 5d, start advair 250/50 1 p bid, and continue albuterol q6h prn.  I have low suspicion of cardiac etiology or PE.  For smoking cessation we'll rx chantix 1g bid x 72mo.  Wellbutrin contraindicated b/c she is on venlafaxine. If does not tolerate chantix then we'll do nicotine replacement--medium dose regimen.  She is in favor of lung cancer screening: ordered low dose CT noncontrast for screening.  2) HTN: bp up today. We did not address this directly. Will address this further when I re-assess her in 3-4 wks. Cont lisinopril 40 qd, hctz 25 qd, and lopressor 100 bid.  An After Visit Summary was printed and given to the patient.  FOLLOW UP: Return for 3-4 wks f/u copd/tob cess.  Signed:  Crissie Sickles, MD           10/22/2020

## 2020-10-26 ENCOUNTER — Telehealth: Payer: Self-pay

## 2020-10-26 NOTE — Telephone Encounter (Signed)
Patient called regarding her referral for lunch screening.  She is wanting to change locations to [facility].  And wants this done as soon as possible, she is aware of PA started.

## 2020-11-14 ENCOUNTER — Other Ambulatory Visit: Payer: Self-pay | Admitting: Family Medicine

## 2020-11-19 ENCOUNTER — Encounter: Payer: Self-pay | Admitting: Family Medicine

## 2020-11-19 ENCOUNTER — Other Ambulatory Visit: Payer: Self-pay

## 2020-11-19 ENCOUNTER — Ambulatory Visit (HOSPITAL_BASED_OUTPATIENT_CLINIC_OR_DEPARTMENT_OTHER)
Admission: RE | Admit: 2020-11-19 | Discharge: 2020-11-19 | Disposition: A | Discharge status: Home / Self Care | Payer: No Typology Code available for payment source | Source: Ambulatory Visit | Attending: Family Medicine | Admitting: Family Medicine

## 2020-11-19 ENCOUNTER — Ambulatory Visit (INDEPENDENT_AMBULATORY_CARE_PROVIDER_SITE_OTHER): Payer: No Typology Code available for payment source | Admitting: Family Medicine

## 2020-11-19 VITALS — BP 121/76 | HR 61 | Temp 97.9°F | Ht 63.5 in | Wt 181.4 lb

## 2020-11-19 DIAGNOSIS — Z87891 Personal history of nicotine dependence: Secondary | ICD-10-CM | POA: Diagnosis present

## 2020-11-19 DIAGNOSIS — Z23 Encounter for immunization: Secondary | ICD-10-CM | POA: Diagnosis not present

## 2020-11-19 DIAGNOSIS — R059 Cough, unspecified: Secondary | ICD-10-CM | POA: Diagnosis not present

## 2020-11-19 DIAGNOSIS — Z122 Encounter for screening for malignant neoplasm of respiratory organs: Secondary | ICD-10-CM | POA: Insufficient documentation

## 2020-11-19 NOTE — Progress Notes (Signed)
See student note from today. Signed:  Crissie Sickles, MD           11/19/2020

## 2020-11-19 NOTE — Progress Notes (Addendum)
OFFICE VISIT  11/19/2020  CC:  Chief Complaint  Patient presents with   Follow-up    COPD; pt states she has had improvement since last OV     HPI:    Patient is a 55 y.o. female who presents for 1 mo f/u copd + tobacco dependence/cessation. Patient experienced dyspnea on exertion a few months back and also has a chronic productive with yellow/green sputum. Since starting Chantix, patient says she has complete alleviation of her dyspnea and has not needed to use her rescue Albuterol or Advair for symptom control. Patient has not had a cigarette since 11/10/20 and is attributing her improvement to her cessation of tobacco use. She still has a productive cough but her symptoms are improving after the cessation of tobacco usage.    ROS is positive for productive cough with yellow/green sputum but otherwise negative  Past Medical History:  Diagnosis Date   Anxiety and depression 11/11/2011   Started counseling 12/2015, counselor recommended effexor so I started it 01/02/16.   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  However, unclear etiology.  ENT considering dx of SSNHL or otosclerosis.  ENT rechecking 2 wks as of 08/03/17 office note.   Hypertension    Menometrorrhagia 11/11/2011   Overweight(278.02) 11/11/2011   Lipids excellent 10/2012   Palpitations 05/2020   Perimenopausal disorder 2013   Sigmoid diverticulosis    on colonoscopy 02/2017   Tobacco dependence     Past Surgical History:  Procedure Laterality Date   CESAREAN SECTION  1991   COLONOSCOPY W/ POLYPECTOMY  03/06/2017   adenomatous polyp: recall 5 yrs    Outpatient Medications Prior to Visit  Medication Sig Dispense Refill   albuterol (VENTOLIN HFA) 108 (90 Base) MCG/ACT inhaler INHALE 2 PUFFS INTO THE LUNGS EVERY 4 HOURS AS NEEDED FOR WHEEZE OR FOR SHORTNESS OF BREATH 8.5 each 2   fluticasone-salmeterol (ADVAIR) 250-50 MCG/ACT AEPB Inhale 1 puff into the lungs in the morning and at  bedtime. 60 each 6   hydrochlorothiazide (HYDRODIURIL) 25 MG tablet TAKE 1 TABLET BY MOUTH EVERY DAY 90 tablet 1   lisinopril (ZESTRIL) 40 MG tablet 1 tab po qd 90 tablet 3   metoprolol tartrate (LOPRESSOR) 100 MG tablet Take 1 tablet (100 mg total) by mouth 2 (two) times daily. 180 tablet 3   varenicline (CHANTIX) 1 MG tablet Take 1 tablet (1 mg total) by mouth 2 (two) times daily. 60 tablet 2   venlafaxine XR (EFFEXOR-XR) 75 MG 24 hr capsule Take 1 capsule (75 mg total) by mouth daily with breakfast. 90 capsule 3   ibuprofen (ADVIL) 200 MG tablet Take 400 mg by mouth once. (Patient not taking: No sig reported)     predniSONE (DELTASONE) 20 MG tablet 2 tabs po qd x 5d, then 1 tab po qd x 5d (Patient not taking: Reported on 11/19/2020) 15 tablet 0   No facility-administered medications prior to visit.    Allergies  Allergen Reactions   Tamiflu [Oseltamivir] Nausea Only    ROS As per HPI  PE: Vitals with BMI 11/19/2020 10/22/2020 06/08/2020  Height 5' 3.5" 5' 3.5" 5' 3.5"  Weight 181 lbs 6 oz 179 lbs 13 oz 177 lbs 3 oz  BMI 31.63 37.90 24.09  Systolic 735 329 924  Diastolic 76 80 70  Pulse 61 68 69   Physical Exam Constitutional:      Appearance: Normal appearance.  Cardiovascular:     Rate and Rhythm:  Normal rate and regular rhythm.     Heart sounds: Normal heart sounds.  Pulmonary:     Effort: Pulmonary effort is normal.     Breath sounds: Normal breath sounds. No wheezing.  Neurological:     Mental Status: She is alert.   LABS:    Chemistry      Component Value Date/Time   NA 132 (L) 06/20/2020 0835   NA 135 01/15/2018 0823   K 4.5 06/20/2020 0835   CL 92 (L) 06/20/2020 0835   CO2 26 06/20/2020 0835   BUN 7 06/20/2020 0835   BUN 11 01/15/2018 0823   CREATININE 0.67 06/20/2020 0835      Component Value Date/Time   CALCIUM 9.2 06/20/2020 0835   ALKPHOS 88 06/07/2019 1001   AST 29 06/20/2020 0835   ALT 21 06/20/2020 0835   BILITOT 0.5 06/20/2020 0835   BILITOT  0.3 01/15/2018 0823       IMPRESSION AND PLAN:  Patient presents with history most consistent with COPD (progressive dyspnea on exertion, chronic productive cough, > 40 pack year history). Patient will continue Chantix 1g bid for smoking cessation as the patient continues tolerates the medication well. Current motivation is high for smoking cessation and will continue to encourage it. Patient instructed to keep rescue inhaler nearby and to use Advair if necessary. Low dose Lung CT without contrast is planned today. Chronic cough is not an issue at this time. Blood pressure looks good today, will continue to reassess in future visits.  An After Visit Summary was printed and given to the patient.  FOLLOW UP: Return for CPE approx 05/2021.  Phil Dopp - MS3  I personally was present during the history, physical exam, and medical decision-making activities of this service and have verified that the service and findings are accurately documented in the student's note.  Signed:  Crissie Sickles, MD           11/19/2020

## 2020-11-20 ENCOUNTER — Encounter: Payer: Self-pay | Admitting: Family Medicine

## 2020-11-20 ENCOUNTER — Other Ambulatory Visit: Payer: Self-pay | Admitting: Family Medicine

## 2020-11-20 DIAGNOSIS — Z87891 Personal history of nicotine dependence: Secondary | ICD-10-CM

## 2020-11-20 DIAGNOSIS — R918 Other nonspecific abnormal finding of lung field: Secondary | ICD-10-CM

## 2020-11-20 DIAGNOSIS — R9389 Abnormal findings on diagnostic imaging of other specified body structures: Secondary | ICD-10-CM

## 2020-11-20 HISTORY — DX: Other nonspecific abnormal finding of lung field: R91.8

## 2020-12-03 ENCOUNTER — Encounter: Payer: Self-pay | Admitting: Pulmonary Disease

## 2020-12-03 ENCOUNTER — Institutional Professional Consult (permissible substitution): Payer: No Typology Code available for payment source | Admitting: Pulmonary Disease

## 2020-12-03 ENCOUNTER — Other Ambulatory Visit: Payer: Self-pay

## 2020-12-03 ENCOUNTER — Ambulatory Visit (INDEPENDENT_AMBULATORY_CARE_PROVIDER_SITE_OTHER): Payer: No Typology Code available for payment source | Admitting: Pulmonary Disease

## 2020-12-03 VITALS — BP 124/66 | HR 80 | Temp 98.1°F | Ht 63.0 in | Wt 179.8 lb

## 2020-12-03 DIAGNOSIS — J479 Bronchiectasis, uncomplicated: Secondary | ICD-10-CM

## 2020-12-03 DIAGNOSIS — Z87891 Personal history of nicotine dependence: Secondary | ICD-10-CM | POA: Diagnosis not present

## 2020-12-03 DIAGNOSIS — R911 Solitary pulmonary nodule: Secondary | ICD-10-CM

## 2020-12-03 NOTE — Patient Instructions (Signed)
Thank you for visiting Dr. Valeta Harms at [facility]. Today we recommend the following:  Orders Placed This Encounter  Procedures   NM PET Image Initial (PI) Skull Base To Thigh (F-18 FDG)   Pulmonary Function Test   Keep up with the smoking cessation. Phebe Colla!   Return in about 4 weeks (around 12/31/2020) for with Eric Form, NP, or Dr. Valeta Harms.    Please do your part to reduce the spread of COVID-19.

## 2020-12-03 NOTE — Progress Notes (Signed)
Synopsis: Referred in November 2022 for lung nodule by Tammi Sou, MD  Subjective:   PATIENT ID: Kristin Day GENDER: female DOB: 12-22-1965, MRN: [id number]  Chief Complaint  Patient presents with   Consult    Patient wants to talk about CT results    This is a 55 year old female, past medical history of hypertension, tobacco dependence, palpitations.  Patient had a lung cancer screening CT in November 2022.Patient's lung cancer screening CT revealed a 19.3 mm right medial portion of the right middle lobe lesion.  Also has areas of bronchiectasis.  We reviewed her CT imaging today in the office.  She does not have any hemoptysis.  Mother with COPD.  No family history of lung cancer.   Past Medical History:  Diagnosis Date   Anxiety and depression 11/11/2011   Started counseling 12/2015, counselor recommended effexor so I started it 01/02/16.   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  However, unclear etiology.  ENT considering dx of SSNHL or otosclerosis.  ENT rechecking 2 wks as of 08/03/17 office note.   Hypertension    Menometrorrhagia 11/11/2011   Multiple pulmonary nodules 11/20/2020   Lung ca screening CT-->LungRADS 4B, "suspicious"-> referral to Dr. Valeta Harms.   Overweight(278.02) 11/11/2011   Lipids excellent 10/2012   Palpitations 05/2020   Perimenopausal disorder 2013   Sigmoid diverticulosis    on colonoscopy 02/2017   Tobacco dependence      Family History  Problem Relation Age of Onset   COPD Mother        smoker   Emphysema Mother    Hypertension Father    Cancer Father        skin/BCC and prostate   Colon polyps Father    Cancer Maternal Grandmother        ovarian   Emphysema Maternal Grandfather        ?   Heart disease Paternal Grandmother    Heart disease Paternal Grandfather    Hypertension Paternal Grandfather    Diabetes Paternal Grandfather    Colon cancer Neg Hx    Rectal cancer Neg Hx    Stomach cancer  Neg Hx    Esophageal cancer Neg Hx      Past Surgical History:  Procedure Laterality Date   CESAREAN SECTION  1991   COLONOSCOPY W/ POLYPECTOMY  03/06/2017   adenomatous polyp: recall 5 yrs    Social History   Socioeconomic History   Marital status: Married    Spouse name: Not on file   Number of children: Not on file   Years of education: Not on file   Highest education level: Not on file  Occupational History   Not on file  Tobacco Use   Smoking status: Former    Packs/day: 0.50    Years: 30.00    Pack years: 15.00    Types: Cigarettes   Smokeless tobacco: Never  Vaping Use   Vaping Use: Never used  Substance and Sexual Activity   Alcohol use: Yes    Comment: 6-12 beers weekly   Drug use: No   Sexual activity: Yes    Partners: Male  Other Topics Concern   Not on file  Social History Narrative   Monogamous relationship as of 08/2012.   Has one adult son.   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   +Smoker, active as of 11/2014.   +Alcohol 2 beers a day, more  on weekends as of 08/2012.   No drugs.         Social Determinants of Health   Financial Resource Strain: Not on file  Food Insecurity: Not on file  Transportation Needs: Not on file  Physical Activity: Not on file  Stress: Not on file  Social Connections: Not on file  Intimate Partner Violence: Not on file     Allergies  Allergen Reactions   Tamiflu [Oseltamivir] Nausea Only     Outpatient Medications Prior to Visit  Medication Sig Dispense Refill   albuterol (VENTOLIN HFA) 108 (90 Base) MCG/ACT inhaler INHALE 2 PUFFS INTO THE LUNGS EVERY 4 HOURS AS NEEDED FOR WHEEZE OR FOR SHORTNESS OF BREATH 8.5 each 2   hydrochlorothiazide (HYDRODIURIL) 25 MG tablet TAKE 1 TABLET BY MOUTH EVERY DAY 90 tablet 1   lisinopril (ZESTRIL) 40 MG tablet 1 tab po qd 90 tablet 3   metoprolol tartrate (LOPRESSOR) 100 MG tablet Take 1 tablet (100 mg total) by mouth 2 (two) times daily. 180 tablet 3    varenicline (CHANTIX) 1 MG tablet Take 1 tablet (1 mg total) by mouth 2 (two) times daily. 60 tablet 2   venlafaxine XR (EFFEXOR-XR) 75 MG 24 hr capsule Take 1 capsule (75 mg total) by mouth daily with breakfast. 90 capsule 3   fluticasone-salmeterol (ADVAIR) 250-50 MCG/ACT AEPB Inhale 1 puff into the lungs in the morning and at bedtime. (Patient not taking: Reported on 12/03/2020) 60 each 6   ibuprofen (ADVIL) 200 MG tablet Take 400 mg by mouth once. (Patient not taking: No sig reported)     No facility-administered medications prior to visit.    Review of Systems  Constitutional:  Negative for chills, fever, malaise/fatigue and weight loss.  HENT:  Negative for hearing loss, sore throat and tinnitus.   Eyes:  Negative for blurred vision and double vision.  Respiratory:  Positive for shortness of breath. Negative for cough, hemoptysis, sputum production, wheezing and stridor.   Cardiovascular:  Negative for chest pain, palpitations, orthopnea, leg swelling and PND.  Gastrointestinal:  Negative for abdominal pain, constipation, diarrhea, heartburn, nausea and vomiting.  Genitourinary:  Negative for dysuria, hematuria and urgency.  Musculoskeletal:  Negative for joint pain and myalgias.  Skin:  Negative for itching and rash.  Neurological:  Negative for dizziness, tingling, weakness and headaches.  Endo/Heme/Allergies:  Negative for environmental allergies. Does not bruise/bleed easily.  Psychiatric/Behavioral:  Negative for depression. The patient is not nervous/anxious and does not have insomnia.   All other systems reviewed and are negative.   Objective:  Physical Exam Vitals reviewed.  Constitutional:      General: She is not in acute distress.    Appearance: She is well-developed.  HENT:     Head: Normocephalic and atraumatic.  Eyes:     General: No scleral icterus.    Conjunctiva/sclera: Conjunctivae normal.     Pupils: Pupils are equal, round, and reactive to light.  Neck:      Vascular: No JVD.     Trachea: No tracheal deviation.  Cardiovascular:     Rate and Rhythm: Normal rate and regular rhythm.     Heart sounds: Normal heart sounds. No murmur heard. Pulmonary:     Effort: Pulmonary effort is normal. No tachypnea, accessory muscle usage or respiratory distress.     Breath sounds: No stridor. No wheezing, rhonchi or rales.  Abdominal:     General: Bowel sounds are normal. There is no distension.     Palpations:  Abdomen is soft.     Tenderness: There is no abdominal tenderness.  Musculoskeletal:        General: No tenderness.     Cervical back: Neck supple.  Lymphadenopathy:     Cervical: No cervical adenopathy.  Skin:    General: Skin is warm and dry.     Capillary Refill: Capillary refill takes less than 2 seconds.     Findings: No rash.  Neurological:     Mental Status: She is alert and oriented to person, place, and time.  Psychiatric:        Behavior: Behavior normal.     Vitals:   12/03/20 1331  BP: 124/66  Pulse: 80  Temp: 98.1 F (36.7 C)  TempSrc: Oral  SpO2: 95%  Weight: 179 lb 12.8 oz (81.6 kg)  Height: 5\' 3"  (1.6 m)   95% on RA BMI Readings from Last 3 Encounters:  12/03/20 31.85 kg/m  11/19/20 31.63 kg/m  10/22/20 31.35 kg/m   Wt Readings from Last 3 Encounters:  12/03/20 179 lb 12.8 oz (81.6 kg)  11/19/20 181 lb 6.4 oz (82.3 kg)  10/22/20 179 lb 12.8 oz (81.6 kg)     CBC    Component Value Date/Time   WBC 6.4 06/20/2020 0835   RBC 4.72 06/20/2020 0835   HGB 14.9 06/20/2020 0835   HGB 14.6 01/15/2018 0823   HCT 44.3 06/20/2020 0835   HCT 43.4 01/15/2018 0823   PLT 324 06/20/2020 0835   PLT 336 01/15/2018 0823   MCV 93.9 06/20/2020 0835   MCV 94 01/15/2018 0823   MCH 31.6 06/20/2020 0835   MCHC 33.6 06/20/2020 0835   RDW 12.2 06/20/2020 0835   RDW 11.7 (L) 01/15/2018 0823   LYMPHSABS 1,843 06/20/2020 0835   LYMPHSABS 1.6 01/15/2018 0823   MONOABS 0.5 06/07/2019 1001   EOSABS 141 06/20/2020 0835    EOSABS 0.1 01/15/2018 0823   BASOSABS 51 06/20/2020 0835   BASOSABS 0.1 01/15/2018 0823     Chest Imaging: November 20, 2020 lung cancer screening CT: 19 mm medial right lung lung nodule.  Concerning for possible underlying malignancy.  Other areas of bronchiectasis and smaller scattered inflammatory appearing nodules. The patient's images have been independently reviewed by me.    Pulmonary Functions Testing Results: No flowsheet data found.  FeNO:   Pathology:   Echocardiogram:   Heart Catheterization:     Assessment & Plan:     ICD-10-CM   1. Lung nodule  R91.1 NM PET Image Initial (PI) Skull Base To Thigh (F-18 FDG)    Pulmonary Function Test    2. Bronchiectasis without complication ([facility])  W29.5 NM PET Image Initial (PI) Skull Base To Thigh (F-18 FDG)    Pulmonary Function Test    3. Former smoker  Z87.891       Discussion:  This is a 55 year old female, medial right lung nodule, 19 mm in size abnormal lung cancer screening CT.  Other areas of small bronchiectasis without complication, other inflammatory small nodules.  This larger dominant nodule however is concerning to me.  Especially with her smoking history.  Plan: Next best step for assessment of this nodule be a PET scan. Thing on PET scan results I discussed the pertinence of proceeding with bronchoscopy and tissue sampling. Patient is agreeable to this plan. Pending upon the results of the PET I will also discuss case with thoracic surgery for consideration of removal of lesion.  I think this depends solely on what the PET  scan looks like.  It is a rather large lesion but this may very well represent an inflammatory type nodule especially in the setting of her having areas of bronchiectasis and some other smaller scattered nodules in general. With that information I think thoracic surgery would like to know whether or not were dealing with malignancy before proceeding with resection. However I can discuss  with him.  Patient is agreeable to this plan.  We discussed in detail today in the office.  We appreciate consultation.   Current Outpatient Medications:    albuterol (VENTOLIN HFA) 108 (90 Base) MCG/ACT inhaler, INHALE 2 PUFFS INTO THE LUNGS EVERY 4 HOURS AS NEEDED FOR WHEEZE OR FOR SHORTNESS OF BREATH, Disp: 8.5 each, Rfl: 2   hydrochlorothiazide (HYDRODIURIL) 25 MG tablet, TAKE 1 TABLET BY MOUTH EVERY DAY, Disp: 90 tablet, Rfl: 1   lisinopril (ZESTRIL) 40 MG tablet, 1 tab po qd, Disp: 90 tablet, Rfl: 3   metoprolol tartrate (LOPRESSOR) 100 MG tablet, Take 1 tablet (100 mg total) by mouth 2 (two) times daily., Disp: 180 tablet, Rfl: 3   varenicline (CHANTIX) 1 MG tablet, Take 1 tablet (1 mg total) by mouth 2 (two) times daily., Disp: 60 tablet, Rfl: 2   venlafaxine XR (EFFEXOR-XR) 75 MG 24 hr capsule, Take 1 capsule (75 mg total) by mouth daily with breakfast., Disp: 90 capsule, Rfl: 3   fluticasone-salmeterol (ADVAIR) 250-50 MCG/ACT AEPB, Inhale 1 puff into the lungs in the morning and at bedtime. (Patient not taking: Reported on 12/03/2020), Disp: 60 each, Rfl: 6   ibuprofen (ADVIL) 200 MG tablet, Take 400 mg by mouth once. (Patient not taking: No sig reported), Disp: , Rfl:   I spent 62 minutes dedicated to the care of this patient on the date of this encounter to include pre-visit review of records, face-to-face time with the patient discussing conditions above, post visit ordering of testing, clinical documentation with the electronic health record, making appropriate referrals as documented, and communicating necessary findings to members of the patients care team.   Garner Nash, DO [facility] 12/03/2020 3:28 PM

## 2020-12-03 NOTE — Progress Notes (Incomplete)
Synopsis: Referred in November 2022 for lung nodules by Tammi Sou, MD  Subjective:   PATIENT ID: Kristin Day GENDER: female DOB: 06-08-1965, MRN: [id number]  No chief complaint on file.   This is a 55 year old female, past medical history of hypertension, tobacco dependence, chronic bronchitis, anxiety and depression.  Had a lung cancer screening CT In October 2022.  Patient was referred for evaluation after the lung cancer screening CT.  It was read as a lung RADS 4B she was found to have scattered nodular subpleural densities recommending further evaluation.  OV 12/03/2020: Here today to establish care with new primary pulmonary provider and discuss CT results. ***   Past Medical History:  Diagnosis Date   Anxiety and depression 11/11/2011   Started counseling 12/2015, counselor recommended effexor so I started it 01/02/16.   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  However, unclear etiology.  ENT considering dx of SSNHL or otosclerosis.  ENT rechecking 2 wks as of 08/03/17 office note.   Hypertension    Menometrorrhagia 11/11/2011   Multiple pulmonary nodules 11/20/2020   Lung ca screening CT-->LungRADS 4B, "suspicious"-> referral to Dr. Valeta Harms.   Overweight(278.02) 11/11/2011   Lipids excellent 10/2012   Palpitations 05/2020   Perimenopausal disorder 2013   Sigmoid diverticulosis    on colonoscopy 02/2017   Tobacco dependence      Family History  Problem Relation Age of Onset   COPD Mother        smoker   Emphysema Mother    Hypertension Father    Cancer Father        skin/BCC and prostate   Colon polyps Father    Cancer Maternal Grandmother        ovarian   Emphysema Maternal Grandfather        ?   Heart disease Paternal Grandmother    Heart disease Paternal Grandfather    Hypertension Paternal Grandfather    Diabetes Paternal Grandfather    Colon cancer Neg Hx    Rectal cancer Neg Hx     Stomach cancer Neg Hx    Esophageal cancer Neg Hx      Past Surgical History:  Procedure Laterality Date   CESAREAN SECTION  1991   COLONOSCOPY W/ POLYPECTOMY  03/06/2017   adenomatous polyp: recall 5 yrs    Social History   Socioeconomic History   Marital status: Married    Spouse name: Not on file   Number of children: Not on file   Years of education: Not on file   Highest education level: Not on file  Occupational History   Not on file  Tobacco Use   Smoking status: Every Day    Packs/day: 0.50    Years: 30.00    Pack years: 15.00    Types: Cigarettes   Smokeless tobacco: Never  Vaping Use   Vaping Use: Never used  Substance and Sexual Activity   Alcohol use: Yes    Comment: 6-12 beers weekly   Drug use: No   Sexual activity: Yes    Partners: Male  Other Topics Concern   Not on file  Social History Narrative   Monogamous relationship as of 08/2012.   Has one adult son.   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   +Smoker, active as of 11/2014.   +Alcohol 2 beers a day, more on weekends as of 08/2012.   No drugs.  Social Determinants of Health   Financial Resource Strain: Not on file  Food Insecurity: Not on file  Transportation Needs: Not on file  Physical Activity: Not on file  Stress: Not on file  Social Connections: Not on file  Intimate Partner Violence: Not on file     Allergies  Allergen Reactions   Tamiflu [Oseltamivir] Nausea Only     Outpatient Medications Prior to Visit  Medication Sig Dispense Refill   albuterol (VENTOLIN HFA) 108 (90 Base) MCG/ACT inhaler INHALE 2 PUFFS INTO THE LUNGS EVERY 4 HOURS AS NEEDED FOR WHEEZE OR FOR SHORTNESS OF BREATH 8.5 each 2   fluticasone-salmeterol (ADVAIR) 250-50 MCG/ACT AEPB Inhale 1 puff into the lungs in the morning and at bedtime. 60 each 6   hydrochlorothiazide (HYDRODIURIL) 25 MG tablet TAKE 1 TABLET BY MOUTH EVERY DAY 90 tablet 1   ibuprofen (ADVIL) 200 MG  tablet Take 400 mg by mouth once. (Patient not taking: No sig reported)     lisinopril (ZESTRIL) 40 MG tablet 1 tab po qd 90 tablet 3   metoprolol tartrate (LOPRESSOR) 100 MG tablet Take 1 tablet (100 mg total) by mouth 2 (two) times daily. 180 tablet 3   varenicline (CHANTIX) 1 MG tablet Take 1 tablet (1 mg total) by mouth 2 (two) times daily. 60 tablet 2   venlafaxine XR (EFFEXOR-XR) 75 MG 24 hr capsule Take 1 capsule (75 mg total) by mouth daily with breakfast. 90 capsule 3   No facility-administered medications prior to visit.    ROS   Objective:  Physical Exam   There were no vitals filed for this visit.   on *** LPM *** RA BMI Readings from Last 3 Encounters:  11/19/20 31.63 kg/m  10/22/20 31.35 kg/m  06/08/20 30.90 kg/m   Wt Readings from Last 3 Encounters:  11/19/20 181 lb 6.4 oz (82.3 kg)  10/22/20 179 lb 12.8 oz (81.6 kg)  06/08/20 177 lb 3.2 oz (80.4 kg)     CBC    Component Value Date/Time   WBC 6.4 06/20/2020 0835   RBC 4.72 06/20/2020 0835   HGB 14.9 06/20/2020 0835   HGB 14.6 01/15/2018 0823   HCT 44.3 06/20/2020 0835   HCT 43.4 01/15/2018 0823   PLT 324 06/20/2020 0835   PLT 336 01/15/2018 0823   MCV 93.9 06/20/2020 0835   MCV 94 01/15/2018 0823   MCH 31.6 06/20/2020 0835   MCHC 33.6 06/20/2020 0835   RDW 12.2 06/20/2020 0835   RDW 11.7 (L) 01/15/2018 0823   LYMPHSABS 1,843 06/20/2020 0835   LYMPHSABS 1.6 01/15/2018 0823   MONOABS 0.5 06/07/2019 1001   EOSABS 141 06/20/2020 0835   EOSABS 0.1 01/15/2018 0823   BASOSABS 51 06/20/2020 0835   BASOSABS 0.1 01/15/2018 0823    ***  Chest Imaging: ***  Pulmonary Functions Testing Results: No flowsheet data found.  FeNO: ***  Pathology: ***  Echocardiogram: ***  Heart Catheterization: ***    Assessment & Plan:   No diagnosis found.  Discussion: ***   Current Outpatient Medications:    albuterol (VENTOLIN HFA) 108 (90 Base) MCG/ACT inhaler, INHALE 2 PUFFS INTO THE LUNGS  EVERY 4 HOURS AS NEEDED FOR WHEEZE OR FOR SHORTNESS OF BREATH, Disp: 8.5 each, Rfl: 2   fluticasone-salmeterol (ADVAIR) 250-50 MCG/ACT AEPB, Inhale 1 puff into the lungs in the morning and at bedtime., Disp: 60 each, Rfl: 6   hydrochlorothiazide (HYDRODIURIL) 25 MG tablet, TAKE 1 TABLET BY MOUTH EVERY DAY, Disp: 90 tablet, Rfl:  1   ibuprofen (ADVIL) 200 MG tablet, Take 400 mg by mouth once. (Patient not taking: No sig reported), Disp: , Rfl:    lisinopril (ZESTRIL) 40 MG tablet, 1 tab po qd, Disp: 90 tablet, Rfl: 3   metoprolol tartrate (LOPRESSOR) 100 MG tablet, Take 1 tablet (100 mg total) by mouth 2 (two) times daily., Disp: 180 tablet, Rfl: 3   varenicline (CHANTIX) 1 MG tablet, Take 1 tablet (1 mg total) by mouth 2 (two) times daily., Disp: 60 tablet, Rfl: 2   venlafaxine XR (EFFEXOR-XR) 75 MG 24 hr capsule, Take 1 capsule (75 mg total) by mouth daily with breakfast., Disp: 90 capsule, Rfl: 3  I spent *** minutes dedicated to the care of this patient on the date of this encounter to include pre-visit review of records, face-to-face time with the patient discussing conditions above, post visit ordering of testing, clinical documentation with the electronic health record, making appropriate referrals as documented, and communicating necessary findings to members of the patients care team.   Garner Nash, DO [facility] 12/03/2020 8:31 AM

## 2020-12-12 ENCOUNTER — Other Ambulatory Visit: Payer: Self-pay

## 2020-12-12 ENCOUNTER — Ambulatory Visit (HOSPITAL_COMMUNITY)
Admission: RE | Admit: 2020-12-12 | Discharge: 2020-12-12 | Disposition: A | Discharge status: Home / Self Care | Payer: No Typology Code available for payment source | Source: Ambulatory Visit | Attending: Pulmonary Disease | Admitting: Pulmonary Disease

## 2020-12-12 DIAGNOSIS — J479 Bronchiectasis, uncomplicated: Secondary | ICD-10-CM | POA: Diagnosis present

## 2020-12-12 DIAGNOSIS — R911 Solitary pulmonary nodule: Secondary | ICD-10-CM | POA: Diagnosis not present

## 2020-12-12 LAB — GLUCOSE, CAPILLARY: Glucose-Capillary: 95 mg/dL (ref 70–99)

## 2020-12-12 MED ORDER — FLUDEOXYGLUCOSE F - 18 (FDG) INJECTION
8.9000 | Freq: Once | INTRAVENOUS | Status: AC
  Administered 2020-12-12: 8.94 via INTRAVENOUS

## 2020-12-17 ENCOUNTER — Telehealth: Payer: Self-pay | Admitting: Pulmonary Disease

## 2020-12-17 DIAGNOSIS — R911 Solitary pulmonary nodule: Secondary | ICD-10-CM

## 2020-12-17 NOTE — Telephone Encounter (Signed)
Spoke to patient, who is requesting PET scan results.  She is aware that Dr. Valeta Harms is unavailable until 12/18/2020. She okay with waiting until 12/18/2020 for a response.   Dr. Valeta Harms, please advise. thanks

## 2020-12-18 NOTE — Telephone Encounter (Signed)
Patient is aware of results and voiced her understanding.  CT ordered. Recall placed for 75mo. Nothing further needed at this time.

## 2020-12-20 NOTE — Progress Notes (Signed)
Please let patient know that I have reviewed her PET scan.  The 1.8 cm nodular opacity in the right middle lobe shows low-level uptake.  I think that she needs close follow-up for this.  She needs a repeat noncontrasted CT chest in 6 months.  Thanks,  BLI  Garner Nash, DO [facility] 12/20/2020 11:59 AM

## 2020-12-21 ENCOUNTER — Other Ambulatory Visit: Payer: Self-pay | Admitting: *Deleted

## 2020-12-21 ENCOUNTER — Encounter: Payer: Self-pay | Admitting: *Deleted

## 2020-12-21 DIAGNOSIS — R911 Solitary pulmonary nodule: Secondary | ICD-10-CM

## 2021-01-14 ENCOUNTER — Other Ambulatory Visit: Payer: Self-pay | Admitting: Family Medicine

## 2021-01-15 ENCOUNTER — Encounter: Payer: Self-pay | Admitting: Family

## 2021-01-15 ENCOUNTER — Telehealth (INDEPENDENT_AMBULATORY_CARE_PROVIDER_SITE_OTHER): Payer: No Typology Code available for payment source | Admitting: Family

## 2021-01-15 VITALS — Temp 98.3°F

## 2021-01-15 DIAGNOSIS — J449 Chronic obstructive pulmonary disease, unspecified: Secondary | ICD-10-CM

## 2021-01-15 DIAGNOSIS — U071 COVID-19: Secondary | ICD-10-CM | POA: Diagnosis not present

## 2021-01-15 DIAGNOSIS — Z87891 Personal history of nicotine dependence: Secondary | ICD-10-CM

## 2021-01-15 MED ORDER — MOLNUPIRAVIR EUA 200MG CAPSULE: 4.0000 | ORAL_CAPSULE | Freq: Two times a day (BID) | ORAL | 0 refills | Status: AC

## 2021-01-15 MED ORDER — FLUTICASONE-SALMETEROL 250-50 MCG/ACT IN AEPB: 1.0000 | INHALATION_SPRAY | Freq: Two times a day (BID) | RESPIRATORY_TRACT | 6 refills | Status: DC

## 2021-01-15 NOTE — Progress Notes (Signed)
Virtual Visit via Video   I connected with patient on 01/15/21 at  9:40 AM EST by a video enabled telemedicine application and verified that I am speaking with the correct person using two identifiers.  Location patient: Home Location provider: [facility], Office Persons participating in the virtual visit: Patient, Provider, CMA   I discussed the limitations of evaluation and management by telemedicine and the availability of in person appointments. The patient expressed understanding and agreed to proceed.  Subjective:   HPI:   55 year old female with a history of smoking presents with via video after testing positive for COVID-19 on 12/24. She reports going to a family function with around 38 people presents. He grandson, son and daughter in law all have congestion symptoms but tested negative for COVID. Her fever has resolved but continues to have cough, chest congestion, no wheezing. She has used her albuterol once this morning and has Advair but admits to never using it.   ROS:   See pertinent positives and negatives per HPI.  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   Overweight(278.02) 11/11/2011   Preventative health care 11/11/2011   Anxiety and depression 11/11/2011   Tobacco abuse 11/11/2011   Hypertension     Social History   Tobacco Use   Smoking status: Former    Packs/day: 0.50    Years: 30.00    Pack years: 15.00    Types: Cigarettes   Smokeless tobacco: Never  Substance Use Topics   Alcohol use: Yes    Comment: 6-12 beers weekly    Current Outpatient Medications:    albuterol (VENTOLIN HFA) 108 (90 Base) MCG/ACT  inhaler, INHALE 2 PUFFS INTO THE LUNGS EVERY 4 HOURS AS NEEDED FOR WHEEZE OR FOR SHORTNESS OF BREATH, Disp: 8.5 each, Rfl: 2   molnupiravir EUA (LAGEVRIO) 200 mg CAPS capsule, Take 4 capsules (800 mg total) by mouth 2 (two) times daily for 5 days., Disp: 40 capsule, Rfl: 0   fluticasone-salmeterol (ADVAIR) 250-50 MCG/ACT AEPB, Inhale 1 puff into the lungs in the morning and at bedtime., Disp: 60 each, Rfl: 6   hydrochlorothiazide (HYDRODIURIL) 25 MG tablet, TAKE 1 TABLET BY MOUTH EVERY DAY, Disp: 90 tablet, Rfl: 1   ibuprofen (ADVIL) 200 MG tablet, Take 400 mg by mouth once. (Patient not taking: No sig reported), Disp: , Rfl:    lisinopril (ZESTRIL) 40 MG tablet, 1 tab po qd, Disp: 90 tablet, Rfl: 3   metoprolol tartrate (LOPRESSOR) 100 MG tablet, Take 1 tablet (100 mg total) by mouth 2 (two) times daily., Disp: 180 tablet, Rfl: 3   varenicline (CHANTIX) 1 MG tablet, Take 1 tablet (1 mg total) by mouth 2 (two) times daily., Disp: 60 tablet, Rfl: 2   venlafaxine XR (EFFEXOR-XR) 75 MG 24 hr capsule, Take 1 capsule (75 mg total) by mouth daily with breakfast., Disp: 90 capsule, Rfl: 3  Allergies  Allergen Reactions   Tamiflu [Oseltamivir] Nausea Only    Objective:   Temp 98.3 F (36.8 C)    LMP 10/25/2014   Patient is well-developed, well-nourished in no acute distress.  Resting comfortably  at home.  Head is normocephalic, atraumatic.  No  labored breathing.  Speech is clear and coherent with logical content.  Patient is alert and oriented at baseline.    Assessment and Plan:  Nile was seen today for covid positive.  Diagnoses and all orders for this visit:  COVID-19  Chronic obstructive pulmonary disease, unspecified COPD type ([place])  Personal history of tobacco use, presenting hazards to health  Other orders -     fluticasone-salmeterol (ADVAIR) 250-50 MCG/ACT AEPB; Inhale 1 puff into the lungs in the morning and at bedtime. -     molnupiravir EUA (LAGEVRIO) 200 mg CAPS  capsule; Take 4 capsules (800 mg total) by mouth 2 (two) times daily for 5 days.    Discussed treatment options, restarting Advair and using albuterol as a rescue inhaler. Patient verbalizes understanding. Call if symptoms worsen or persist. Recheck as scheduled as sooner as needed.    Kennyth Arnold, FNP 01/15/2021

## 2021-02-19 ENCOUNTER — Telehealth: Payer: Self-pay | Admitting: Pulmonary Disease

## 2021-02-21 ENCOUNTER — Ambulatory Visit: Payer: No Typology Code available for payment source | Admitting: Family Medicine

## 2021-02-21 ENCOUNTER — Encounter: Payer: Self-pay | Admitting: Family Medicine

## 2021-02-21 ENCOUNTER — Other Ambulatory Visit: Payer: Self-pay

## 2021-02-21 VITALS — BP 119/71 | HR 75 | Temp 98.4°F | Ht 63.0 in | Wt 184.8 lb

## 2021-02-21 DIAGNOSIS — J069 Acute upper respiratory infection, unspecified: Secondary | ICD-10-CM

## 2021-02-21 DIAGNOSIS — J209 Acute bronchitis, unspecified: Secondary | ICD-10-CM

## 2021-02-21 DIAGNOSIS — J441 Chronic obstructive pulmonary disease with (acute) exacerbation: Secondary | ICD-10-CM

## 2021-02-21 DIAGNOSIS — J42 Unspecified chronic bronchitis: Secondary | ICD-10-CM

## 2021-02-21 LAB — POC COVID19 BINAXNOW: SARS Coronavirus 2 Ag: NEGATIVE

## 2021-02-21 LAB — POCT INFLUENZA A/B
Influenza A, POC: NEGATIVE
Influenza B, POC: NEGATIVE

## 2021-02-21 MED ORDER — PREDNISONE 20 MG PO TABS: ORAL_TABLET | ORAL | 0 refills | Status: DC

## 2021-02-21 NOTE — Progress Notes (Signed)
OFFICE VISIT  02/21/2021  CC:  Chief Complaint  Patient presents with   Cough    Yesterday afternoon starting, non-productive. Took delsym last night, pressure in chest from coughing, throat feels like its burning.   HPI:    Patient is a 56 y.o. female who presents for cough.  HPI: Onset about 24 hours ago of cough that is dry, slight increase in chest tightness/shortness of breath over her baseline.  She has some nasal congestion today, slight headache.  No sore throat No fever, no body aches.  No hemoptysis.  No chest pain. Taking Advair which she says has helped significantly for her chronic bronchitis symptoms. Not taking any albuterol lately. No known sick contacts.  She has not done any home COVID testing.  She quit smoking a few months ago.  Past Medical History:  Diagnosis Date   Anxiety and depression 11/11/2011   Started counseling 12/2015, counselor recommended effexor so I started it 01/02/16.   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  However, unclear etiology.  ENT considering dx of SSNHL or otosclerosis.  ENT rechecking 2 wks as of 08/03/17 office note.   Hypertension    Menometrorrhagia 11/11/2011   Multiple pulmonary nodules 11/20/2020   Lung ca screening CT-->LungRADS 4B, "suspicious"-> referral to Dr. Valeta Harms.  PET 12/12/20 showed low uptake in the nodule, plan per pulm is rpt CT 6 mo.   Overweight(278.02) 11/11/2011   Lipids excellent 10/2012   Palpitations 05/2020   Perimenopausal disorder 2013   Sigmoid diverticulosis    on colonoscopy 02/2017   Tobacco dependence     Past Surgical History:  Procedure Laterality Date   CESAREAN SECTION  1991   COLONOSCOPY W/ POLYPECTOMY  03/06/2017   adenomatous polyp: recall 5 yrs    Outpatient Medications Prior to Visit  Medication Sig Dispense Refill   albuterol (VENTOLIN HFA) 108 (90 Base) MCG/ACT inhaler INHALE 2 PUFFS INTO THE LUNGS EVERY 4 HOURS AS NEEDED FOR WHEEZE OR FOR  SHORTNESS OF BREATH 8.5 each 2   fluticasone-salmeterol (ADVAIR) 250-50 MCG/ACT AEPB Inhale 1 puff into the lungs in the morning and at bedtime. 60 each 6   hydrochlorothiazide (HYDRODIURIL) 25 MG tablet TAKE 1 TABLET BY MOUTH EVERY DAY 90 tablet 1   lisinopril (ZESTRIL) 40 MG tablet 1 tab po qd 90 tablet 3   metoprolol tartrate (LOPRESSOR) 100 MG tablet Take 1 tablet (100 mg total) by mouth 2 (two) times daily. 180 tablet 3   varenicline (CHANTIX) 1 MG tablet Take 1 tablet (1 mg total) by mouth 2 (two) times daily. 60 tablet 2   venlafaxine XR (EFFEXOR-XR) 75 MG 24 hr capsule Take 1 capsule (75 mg total) by mouth daily with breakfast. 90 capsule 3   ibuprofen (ADVIL) 200 MG tablet Take 400 mg by mouth once. (Patient not taking: Reported on 06/08/2020)     No facility-administered medications prior to visit.    Allergies  Allergen Reactions   Tamiflu [Oseltamivir] Nausea Only    ROS As per HPI  PE: Vitals with BMI 02/21/2021 12/03/2020 11/19/2020  Height 5\' 3"  5\' 3"  5' 3.5"  Weight 184 lbs 13 oz 179 lbs 13 oz 181 lbs 6 oz  BMI 32.74 34.19 62.22  Systolic 979 892 119  Diastolic 71 66 76  Pulse 75 80 61  02 sat 96% on RA today  Physical Exam  VS: noted--normal. Gen: alert, NAD, NONTOXIC APPEARING. HEENT: eyes without injection, drainage, or swelling.  Ears: EACs clear, TMs with normal light reflex and landmarks.  Nose: No mucosal edema or injection.  No purulent drainage. no paranasal sinus TTP.  No facial swelling.  Throat and mouth without focal lesion.  No pharyngial swelling, erythema, or exudate.   Neck: supple, no LAD.   LUNGS: Clear bilaterally on inspiration, some slight expiratory wheezing and significantly prolonged expiratory phase diffusely.  Nonlabored resps.   CV: RRR, no m/r/g. EXT: no c/c/e SKIN: no rash  LABS:  Last CBC Lab Results  Component Value Date   WBC 6.4 06/20/2020   HGB 14.9 06/20/2020   HCT 44.3 06/20/2020   MCV 93.9 06/20/2020   MCH 31.6  06/20/2020   RDW 12.2 06/20/2020   PLT 324 15/83/0940   Last metabolic panel Lab Results  Component Value Date   GLUCOSE 92 06/20/2020   NA 132 (L) 06/20/2020   K 4.5 06/20/2020   CL 92 (L) 06/20/2020   CO2 26 06/20/2020   BUN 7 06/20/2020   CREATININE 0.67 06/20/2020   GFRNONAA >60 07/15/2018   CALCIUM 9.2 06/20/2020   PHOS 3.0 11/11/2011   PROT 7.2 06/20/2020   ALBUMIN 4.2 06/07/2019   LABGLOB 2.4 01/15/2018   AGRATIO 1.9 01/15/2018   BILITOT 0.5 06/20/2020   ALKPHOS 88 06/07/2019   AST 29 06/20/2020   ALT 21 06/20/2020   ANIONGAP 10 07/15/2018   Last lipids Lab Results  Component Value Date   CHOL 163 06/20/2020   HDL 93 06/20/2020   LDLCALC 55 06/20/2020   TRIG 66 06/20/2020   CHOLHDL 1.8 06/20/2020   Last hemoglobin A1c No results found for: HGBA1C Last thyroid functions Lab Results  Component Value Date   TSH 1.05 06/20/2020   IMPRESSION AND PLAN:  Acute exacerbation of chronic bronchitis.  Viral etiology suspected. Rapid COVID and rapid flu today in the office both negative. Prednisone 40 mg/day x 5 days, then 20 mg/day x 5 days. Recommended albuterol inhaler 2 puffs 4 times daily scheduled for the next 2 days, then change to 4 times daily as needed.  Continue Advair inhaler.  An After Visit Summary was printed and given to the patient.  FOLLOW UP: Return if symptoms worsen or fail to improve.  Signed:  Crissie Sickles, MD           02/21/2021

## 2021-02-22 ENCOUNTER — Other Ambulatory Visit: Payer: Self-pay | Admitting: Family Medicine

## 2021-03-08 NOTE — Telephone Encounter (Signed)
I called pt to clarify - if she wants CT prior to May we will need an ok from [facility].  She states she was wanting to get it done sooner but she has since changed her mind and is fine to wait til May.  I told her we would be in contact with her in April to get scheduled for May and to let us know if she changes her mind.  Nothing further needed.

## 2021-05-07 ENCOUNTER — Telehealth: Payer: Self-pay | Admitting: Pulmonary Disease

## 2021-05-07 NOTE — Telephone Encounter (Signed)
Prior Kristin Day has not been obtained yet.  I will take a look this afternoon to see if I can get the auth initiated.  ?

## 2021-05-17 ENCOUNTER — Other Ambulatory Visit: Payer: Self-pay | Admitting: Family Medicine

## 2021-05-22 NOTE — Telephone Encounter (Signed)
Auth is attached to the order ?

## 2021-05-28 ENCOUNTER — Other Ambulatory Visit: Payer: Self-pay | Admitting: Pulmonary Disease

## 2021-05-28 ENCOUNTER — Ambulatory Visit (HOSPITAL_BASED_OUTPATIENT_CLINIC_OR_DEPARTMENT_OTHER)
Admission: RE | Admit: 2021-05-28 | Discharge: 2021-05-28 | Disposition: A | Discharge status: Home / Self Care | Payer: No Typology Code available for payment source | Source: Ambulatory Visit | Attending: Family Medicine | Admitting: Family Medicine

## 2021-05-28 DIAGNOSIS — R911 Solitary pulmonary nodule: Secondary | ICD-10-CM | POA: Diagnosis present

## 2021-05-30 ENCOUNTER — Telehealth: Payer: Self-pay | Admitting: Pulmonary Disease

## 2021-06-03 ENCOUNTER — Encounter: Payer: Self-pay | Admitting: Pulmonary Disease

## 2021-06-03 NOTE — Telephone Encounter (Signed)
Garner Nash, DO 2 days ago  ? ? ?Please set up appt with SG to discuss in detail.  ? ?I have looked at the results. Nodules appear stable. She will need to continue to have these followed.  ? ?Thanks  ? ?BLI    ? ?

## 2021-06-03 NOTE — Telephone Encounter (Signed)
Dr. Valeta Harms please advise on the follow My Chart message:  ? ?Kristin Day  [facility] (supporting Icard, Bradley L, DO) 1 hour ago (2:26 PM)  ? ?I would like Dr. Juline Patch assessment of my CT results.  I also need to know if I need to move forward with PFT or an OV as I am having a harder than usual time getting a deep breath and my cough is not as productive as normal.  Please advise.  Best number is work [phone] then key in ext [phone] (there will not be a prompt for ext option) or secondary number is [phone].  Thank you. ? ?Thank you  ?

## 2021-06-03 NOTE — Telephone Encounter (Signed)
Spoke with pt and advised of CT results per Dr Valeta Harms. Appt made for pt to see Eric Form, NP on 07/02/21 12:00. Pt verbalized understanding and had no further questions.  ?

## 2021-06-03 NOTE — Progress Notes (Signed)
Fyi see [employer].  ? ?Thanks, ? ?BLI ? ?Garner Nash, DO ?[facility] ?06/03/2021 4:44 PM  ?

## 2021-06-03 NOTE — Telephone Encounter (Signed)
PCCM: ? ?I have reviewed the CT chest results. ? ?She originally had CT imaging in October 2022 followed by nuclear medicine pet imaging in November 2022.  This showed low FDG uptake that could not exclude low-grade carcinoma.  Made the decision then to have repeat CT imaging in May 2023 with plan to follow-up.  She has follow-up to see Judson Roch in a few weeks. ? ?Recent CT shows waxing and waning of the nodule size.  She also has other areas within the lung with bronchiectasis and nodular distortion of the airway.  Would lead concerns for the nodules to be predominantly inflammatory in nature versus malignant.  However due to the size and patient's risk factors she will need to continue to have close surveillance imaging.  This can be discussed at her next office visit.  Other option would be consideration for sampling which can be discussed as well. ? ?CC: Kristin Form, NP  ? ?Digital Time: 6 mins  ? ?Kristin Nash, DO ?Kristin Day ?06/03/2021 4:32 PM  ? ?  ?

## 2021-06-07 ENCOUNTER — Telehealth (INDEPENDENT_AMBULATORY_CARE_PROVIDER_SITE_OTHER): Payer: No Typology Code available for payment source | Admitting: Family Medicine

## 2021-06-07 ENCOUNTER — Encounter: Payer: Self-pay | Admitting: Family Medicine

## 2021-06-07 DIAGNOSIS — J209 Acute bronchitis, unspecified: Secondary | ICD-10-CM

## 2021-06-07 DIAGNOSIS — J440 Chronic obstructive pulmonary disease with acute lower respiratory infection: Secondary | ICD-10-CM

## 2021-06-07 MED ORDER — PREDNISONE 20 MG PO TABS: ORAL_TABLET | ORAL | 0 refills | Status: DC

## 2021-06-07 NOTE — Progress Notes (Signed)
Virtual Visit via Video Note  I connected with Kristin Day on 06/07/21 at  4:20 PM EDT by a video enabled telemedicine application and verified that I am speaking with the correct person using two identifiers.  Location patient: [place] Location provider:work or home office Persons participating in the virtual visit: patient, provider  I discussed the limitations and requested verbal permission for telemedicine visit. The patient expressed understanding and agreed to proceed.  HPI: 56 y/o female being seen today for cough. 4 days ago she had abrupt onset of fever, malaise and fatigue, cough, chest tightness.  This lasted about 24 hours and then all symptoms resolved.  She does have a chronic cough.  Says she feels like this cough in her chest feel at or near her baseline last couple of days. She quit smoking back in October 2022. She is currently followed by Dr. Valeta Harms and pulmonology for suspicious pulmonary nodules.  ROS: See pertinent positives and negatives per HPI.  Past Medical History:  Diagnosis Date   Anxiety and depression 11/11/2011   Started counseling 12/2015, counselor recommended effexor so I started it 01/02/16.   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  However, unclear etiology.  ENT considering dx of SSNHL or otosclerosis.  ENT rechecking 2 wks as of 08/03/17 office note.   Hypertension    Menometrorrhagia 11/11/2011   Multiple pulmonary nodules 11/20/2020   Lung ca screening CT-->LungRADS 4B, "suspicious"-> referral to Dr. Valeta Harms.  PET 12/12/20 showed low uptake in the nodule, plan per pulm is rpt CT 6 mo.   Overweight(278.02) 11/11/2011   Lipids excellent 10/2012   Palpitations 05/2020   Perimenopausal disorder 2013   Sigmoid diverticulosis    on colonoscopy 02/2017   Tobacco dependence     Past Surgical History:  Procedure Laterality Date   CESAREAN SECTION  1991   COLONOSCOPY W/ POLYPECTOMY  03/06/2017   adenomatous polyp: recall 5 yrs      Current Outpatient Medications:    fluticasone-salmeterol (ADVAIR) 250-50 MCG/ACT AEPB, Inhale 1 puff into the lungs in the morning and at bedtime., Disp: 60 each, Rfl: 6   hydrochlorothiazide (HYDRODIURIL) 25 MG tablet, TAKE 1 TABLET BY MOUTH EVERY DAY, Disp: 30 tablet, Rfl: 0   lisinopril (ZESTRIL) 40 MG tablet, 1 tab po qd, Disp: 90 tablet, Rfl: 3   metoprolol tartrate (LOPRESSOR) 100 MG tablet, Take 1 tablet (100 mg total) by mouth 2 (two) times daily. (Patient taking differently: Take 100 mg by mouth daily.), Disp: 180 tablet, Rfl: 3   venlafaxine XR (EFFEXOR-XR) 75 MG 24 hr capsule, Take 1 capsule (75 mg total) by mouth daily with breakfast., Disp: 90 capsule, Rfl: 3   albuterol (VENTOLIN HFA) 108 (90 Base) MCG/ACT inhaler, INHALE 2 PUFFS INTO THE LUNGS EVERY 4 HOURS AS NEEDED FOR WHEEZE OR FOR SHORTNESS OF BREATH (Patient not taking: Reported on 06/07/2021), Disp: 8.5 each, Rfl: 2   predniSONE (DELTASONE) 20 MG tablet, 2 tabs po qd x 5d then 1 tab po qd x 5d (Patient not taking: Reported on 06/07/2021), Disp: 15 tablet, Rfl: 0  EXAM:  VITALS per patient if applicable:     03/22/9922    4:00 PM 12/03/2020    1:31 PM 11/19/2020   10:14 AM  Vitals with BMI  Height '5\' 3"'$  '5\' 3"'$  5' 3.5"  Weight 184 lbs 13 oz 179 lbs 13 oz 181 lbs 6 oz  BMI 32.74 26.83 41.96  Systolic 222 979 892  Diastolic  71 66 76  Pulse 75 80 61    GENERAL: alert, oriented, appears well and in no acute distress  HEENT: atraumatic, conjunttiva clear, no obvious abnormalities on inspection of external nose and ears  NECK: normal movements of the head and neck  LUNGS: on inspection no signs of respiratory distress, breathing rate appears normal, no obvious gross SOB, gasping or wheezing  CV: no obvious cyanosis  MS: moves all visible extremities without noticeable abnormality  PSYCH/NEURO: pleasant and cooperative, no obvious depression or anxiety, speech and thought processing grossly intact  LABS: none  today    Chemistry      Component Value Date/Time   NA 132 (L) 06/20/2020 0835   NA 135 01/15/2018 0823   K 4.5 06/20/2020 0835   CL 92 (L) 06/20/2020 0835   CO2 26 06/20/2020 0835   BUN 7 06/20/2020 0835   BUN 11 01/15/2018 0823   CREATININE 0.67 06/20/2020 0835      Component Value Date/Time   CALCIUM 9.2 06/20/2020 0835   ALKPHOS 88 06/07/2019 1001   AST 29 06/20/2020 0835   ALT 21 06/20/2020 0835   BILITOT 0.5 06/20/2020 0835   BILITOT 0.3 01/15/2018 0823     Lab Results  Component Value Date   WBC 6.4 06/20/2020   HGB 14.9 06/20/2020   HCT 44.3 06/20/2020   MCV 93.9 06/20/2020   PLT 324 06/20/2020   ASSESSMENT AND PLAN:  Discussed the following assessment and plan:  1) Acute on chronic bronchitis. Prednisone prescription sent today for patient to start over the weekend if she feels return of her symptoms-->'40mg'$  qd x5d, then '20mg'$  qd x 5d. We will continue Advair twice daily and has albuterol use as needed.   2) multiple suspicious pulmonary nodules. Followed by Dr. Valeta Harms. Most recent CT of the chest on 05/28/2021 showed slight decrease in size of the concerning areas per the radiologist interpretation. She has appointment with Dr.Icard on 07/02/2021 to discuss future plans.  I discussed the assessment and treatment plan with the patient. The patient was provided an opportunity to ask questions and all were answered. The patient agreed with the plan and demonstrated an understanding of the instructions.   F/u: if not improving  Signed:  Crissie Sickles, MD           06/07/2021

## 2021-06-12 ENCOUNTER — Other Ambulatory Visit: Payer: Self-pay | Admitting: Family Medicine

## 2021-07-02 ENCOUNTER — Encounter: Payer: Self-pay | Admitting: Acute Care

## 2021-07-02 ENCOUNTER — Ambulatory Visit (INDEPENDENT_AMBULATORY_CARE_PROVIDER_SITE_OTHER): Payer: No Typology Code available for payment source | Admitting: Acute Care

## 2021-07-02 VITALS — BP 124/76 | HR 70 | Temp 98.2°F | Ht 63.5 in | Wt 199.8 lb

## 2021-07-02 DIAGNOSIS — R911 Solitary pulmonary nodule: Secondary | ICD-10-CM

## 2021-07-02 DIAGNOSIS — R0600 Dyspnea, unspecified: Secondary | ICD-10-CM

## 2021-07-02 DIAGNOSIS — J479 Bronchiectasis, uncomplicated: Secondary | ICD-10-CM

## 2021-07-02 DIAGNOSIS — Z6834 Body mass index (BMI) 34.0-34.9, adult: Secondary | ICD-10-CM | POA: Diagnosis not present

## 2021-07-02 NOTE — Progress Notes (Signed)
History of Present Illness Kristin Day is a 56 y.o. female with  past medical history of hypertension, tobacco dependence, palpitations. Patient had a lung cancer screening CT in November 2022.Patient's lung cancer screening CT revealed a 19.3 mm right medial portion of the right middle lobe lesion.  Also has areas of bronchiectasis. She is followed by Dr. Valeta Harms.   Pt. Had abnormal :LDCT 11/2020. PET scan also  done 11/2020  showed an  1.8 cm nodular opacity in the right middle lobe shows low-level uptake.Dr. Valeta Harms ordered a 6 month follow up scan, as this will need to be followed with imaging.    07/02/2021 Pt. Presents for follow up CT after abnormal CT Chest and PET scan 11/2020. She had her follow up low dose CT Chest 05/28/2021 which showed Bronchiectasis, scarring and volume loss within the right middle lobe is noted. This appeared stable to decreased in size in the interval. The mean derived diameter associated with the solid nodular component is equal to 17.2 mm on today's study versus 19.1 mm previously. Within the inferior lingula there is a focal area of bronchiectasis and nodular architectural distortion. The solid component associated with this area is decreased in size with a mean derived diameter of 7.0 mm versus 8.8 mm previously. Within the posterior left base there is a stable area of subpleural consolidation which measures 18.5 mm on today's study versus 20.5 mm previously. No new pulmonary nodule or mass identified  Pt. Denies any hemoptysis. I have secure chatted Dr. Valeta Harms, and he is in agreement with a short term follow up CT Chest in 3 months as we will need to follow this area closely through imaging.  Pt. Does complain of some dyspnea. She is compliant with her Advair daily. She denies wheezing. She quit smoking 11/2020, and she has had some weight gain since. She also states she has a lot of thick secretions.  They are light yellow in color. She is not doing daily maintenance  for her bronchiectasis. She is willing to start this. We did talk about her weight gain and physical deconditioning. She is agreeable to walking and slowly increasing the distances she goes. We talked about using her rescue inhaler prior to exercising to see if that helps with her dyspnea . She is also planning on doing the [facility] program. Sats were 99% on RA. We will get PFT's .     Test Results:     Latest Ref Rng & Units 06/20/2020    8:35 AM 06/07/2019   10:01 AM 07/15/2018   10:22 AM  CBC  WBC 3.8 - 10.8 Thousand/uL 6.4  4.5  6.0   Hemoglobin 11.7 - 15.5 g/dL 14.9  14.4  14.5   Hematocrit 35.0 - 45.0 % 44.3  42.0  43.4   Platelets 140 - 400 Thousand/uL 324  312.0  308        Latest Ref Rng & Units 06/20/2020    8:35 AM 06/07/2019   10:01 AM 07/15/2018   10:22 AM  BMP  Glucose 65 - 99 mg/dL 92  86  92   BUN 7 - 25 mg/dL '7  13  9   '$ Creatinine 0.50 - 1.05 mg/dL 0.67  0.66  0.70   BUN/Creat Ratio 6 - 22 (calc) NOT APPLICABLE     Sodium 161 - 146 mmol/L 132  132  134   Potassium 3.5 - 5.3 mmol/L 4.5  4.3  4.4   Chloride 98 -  110 mmol/L 92  97  100   CO2 20 - 32 mmol/L '26  29  24   '$ Calcium 8.6 - 10.4 mg/dL 9.2  9.2  8.9     BNP No results found for: "BNP"  ProBNP No results found for: "PROBNP"  PFT No results found for: "FEV1PRE", "FEV1POST", "FVCPRE", "FVCPOST", "TLC", "DLCOUNC", "PREFEV1FVCRT", "PSTFEV1FVCRT"  No results found.   Past medical hx Past Medical History:  Diagnosis Date   Anxiety and depression 11/11/2011   Started counseling 12/2015, counselor recommended effexor so I started it 01/02/16.   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  However, unclear etiology.  ENT considering dx of SSNHL or otosclerosis.  ENT rechecking 2 wks as of 08/03/17 office note.   Hypertension    Menometrorrhagia 11/11/2011   Multiple pulmonary nodules 11/20/2020   Lung ca screening CT-->LungRADS 4B, "suspicious"-> referral  to Dr. Valeta Harms.  PET 12/12/20 showed low uptake in the nodule, plan per pulm is rpt CT 6 mo.   Overweight(278.02) 11/11/2011   Lipids excellent 10/2012   Palpitations 05/2020   Perimenopausal disorder 2013   Sigmoid diverticulosis    on colonoscopy 02/2017   Tobacco dependence      Social History   Tobacco Use   Smoking status: Former    Packs/day: 0.50    Years: 30.00    Total pack years: 15.00    Types: Cigarettes   Smokeless tobacco: Never  Vaping Use   Vaping Use: Never used  Substance Use Topics   Alcohol use: Yes    Comment: 6-12 beers weekly   Drug use: No      Tobacco Cessation: Former smoker , Quit 11/2020 with a 40 pack year smoking history  Past surgical hx, Family hx, Social hx all reviewed.  Current Outpatient Medications on File Prior to Visit  Medication Sig   albuterol (VENTOLIN HFA) 108 (90 Base) MCG/ACT inhaler INHALE 2 PUFFS INTO THE LUNGS EVERY 4 HOURS AS NEEDED FOR WHEEZE OR FOR SHORTNESS OF BREATH   fluticasone-salmeterol (ADVAIR) 250-50 MCG/ACT AEPB Inhale 1 puff into the lungs in the morning and at bedtime.   hydrochlorothiazide (HYDRODIURIL) 25 MG tablet TAKE 1 TABLET BY MOUTH EVERY DAY   lisinopril (ZESTRIL) 40 MG tablet 1 tab po qd   metoprolol tartrate (LOPRESSOR) 100 MG tablet Take 1 tablet (100 mg total) by mouth 2 (two) times daily. (Patient taking differently: Take 100 mg by mouth daily.)   predniSONE (DELTASONE) 20 MG tablet 2 tabs po qd x 5d then 1 tab po qd x 5d   venlafaxine XR (EFFEXOR-XR) 75 MG 24 hr capsule Take 1 capsule (75 mg total) by mouth daily with breakfast.   No current facility-administered medications on file prior to visit.     Allergies  Allergen Reactions   Tamiflu [Oseltamivir] Nausea Only    Review Of Systems:  Constitutional:   No  weight loss, night sweats,  Fevers, chills, fatigue, or  lassitude.  HEENT:   No headaches,  Difficulty swallowing,  Tooth/dental problems, or  Sore throat,                No  sneezing, itching, ear ache, nasal congestion, post nasal drip,   CV:  No chest pain,  Orthopnea, PND, swelling in lower extremities, anasarca, dizziness, palpitations, syncope.   GI  No heartburn, indigestion, abdominal pain, nausea, vomiting, diarrhea, change in bowel habits, loss of appetite, bloody stools.   Resp: + shortness  of breath with exertion none at rest.  + excess mucus, + productive cough,  No non-productive cough,  No coughing up of blood.  + change in color of mucus.  No wheezing.  No chest wall deformity  Skin: no rash or lesions.  GU: no dysuria, change in color of urine, no urgency or frequency.  No flank pain, no hematuria   MS:  No joint pain or swelling.  No decreased range of motion.  No back pain.  Psych:  No change in mood or affect. No depression or anxiety.  No memory loss.   Vital Signs BP 124/76 (BP Location: Left Arm, Patient Position: Sitting, Cuff Size: Normal)   Pulse 70   Temp 98.2 F (36.8 C) (Oral)   Ht 5' 3.5" (1.613 m)   Wt 199 lb 12.8 oz (90.6 kg)   LMP 04/20/2013   SpO2 99%   BMI 34.84 kg/m    Physical Exam:  General- No distress,  A&Ox3, pleasant ENT: No sinus tenderness, TM clear, pale nasal mucosa, no oral exudate,no post nasal drip, no LAN Cardiac: S1, S2, regular rate and rhythm, no murmur Chest: No wheeze/ rales/ dullness; + rhonchi, no accessory muscle use, no nasal flaring, no sternal retractions Abd.: Soft Non-tender, ND, BS +, Body mass index is 34.84 kg/m.  Ext: No clubbing cyanosis, edema Neuro:  normal strength, MAE x 4, A&O x 3, physically deconditioned Skin: No rashes, warm and dry, no lesions  Psych: normal mood and behavior   Assessment/Plan Abnormal lung nodule on imaging Dyspnea on exertion  Bronchiectasis Weigh gain after smoking cessation  Plan We will do a 3 month follow up CT Chest without contrast. Follow up appointment in 3 months after CT Chest. Please consider starting [facility]  through [place] [facility].  Continue Advair inhaler 2 puffs twice daily Rinse after use. We will order PFT's  Follow up after PFT's ( 6 weeks)  Continue Rescue inhaler for breakthrough shortness of breath or wheezing. Pre-medicate with rescue before exertional activities.  Start walking on flat surface and slowly increase distance Mucinex 600 mg daily with a full glass of water Flutter valve 4 blows in the morning and evening and more when watching tv. Consider immunoglobulin testing.  Congratulations on quitting smoking. Please contact office for sooner follow up if symptoms do not improve or worsen or seek emergency care     I spent 40  minutes dedicated to the care of this patient on the date of this encounter to include pre-visit review of records, face-to-face time with the patient discussing conditions above, post visit ordering of testing, clinical documentation with the electronic health record, making appropriate referrals as documented, and communicating necessary information to the patient's healthcare team.   Magdalen Spatz, NP 07/02/2021  12:31 PM

## 2021-07-02 NOTE — Patient Instructions (Addendum)
It is good to see you today. We will do a 3 month follow up CT Chest without contrast. Follow up appointment in 3 months after CT Chest. Please consider starting [facility] through [place].  Continue Advair inhaler 2 puffs twice daily Rinse after use. We will order PFT's  Follow up after PFT's ( 6 weeks)  Continue Rescue inhaler for breakthrough shortness of breath or wheezing. Pre-medicate with rescue before exertional activities.  Start walking on flat surface and slowly increase distance Mucinex 600 mg daily with a full glass of water Flutter valve 4 blows in the morning and evening and more when watching tv. Consider immunoglobulin testing.  Congratulations on quitting smoking. Please contact office for sooner follow up if symptoms do not improve or worsen or seek emergency care

## 2021-07-03 ENCOUNTER — Other Ambulatory Visit: Payer: Self-pay | Admitting: Family Medicine

## 2021-07-11 ENCOUNTER — Other Ambulatory Visit: Payer: Self-pay | Admitting: Family Medicine

## 2021-07-29 ENCOUNTER — Telehealth: Payer: Self-pay

## 2021-07-29 ENCOUNTER — Encounter: Payer: Self-pay | Admitting: Family Medicine

## 2021-07-29 ENCOUNTER — Ambulatory Visit: Payer: No Typology Code available for payment source | Admitting: Family Medicine

## 2021-07-29 ENCOUNTER — Encounter: Payer: No Typology Code available for payment source | Admitting: Family Medicine

## 2021-07-29 VITALS — BP 131/81 | HR 61 | Temp 98.0°F | Ht 63.5 in | Wt 199.8 lb

## 2021-07-29 DIAGNOSIS — M7062 Trochanteric bursitis, left hip: Secondary | ICD-10-CM

## 2021-07-29 DIAGNOSIS — Z131 Encounter for screening for diabetes mellitus: Secondary | ICD-10-CM

## 2021-07-29 DIAGNOSIS — Z0000 Encounter for general adult medical examination without abnormal findings: Secondary | ICD-10-CM

## 2021-07-29 DIAGNOSIS — M7061 Trochanteric bursitis, right hip: Secondary | ICD-10-CM | POA: Diagnosis not present

## 2021-07-29 DIAGNOSIS — Z1231 Encounter for screening mammogram for malignant neoplasm of breast: Secondary | ICD-10-CM

## 2021-07-29 DIAGNOSIS — I10 Essential (primary) hypertension: Secondary | ICD-10-CM

## 2021-07-29 MED ORDER — LISINOPRIL 40 MG PO TABS: ORAL_TABLET | ORAL | 1 refills | Status: DC

## 2021-07-29 MED ORDER — HYDROCHLOROTHIAZIDE 25 MG PO TABS: 25.0000 mg | ORAL_TABLET | Freq: Every day | ORAL | 1 refills | Status: DC

## 2021-07-29 NOTE — Progress Notes (Signed)
Office Note 07/29/2021  CC:  Chief Complaint  Patient presents with   Annual Exam    Pt is fasting   HPI:  Patient is a 56 y.o. female who is here for annual health maintenance exam and follow-up hypertension and anxiety.  Home blood pressures consistently less than 130/80.  She is becoming increasingly frustrated with her gradual weight gain since she quit smoking.  She states she does not feel like her caloric intake has increased during this time but admits she has not been able to do much from a physical exertion standpoint due to her dyspnea on exertion and bilateral hip pain--lateral aspects.   Past Medical History:  Diagnosis Date   Anxiety and depression 11/11/2011   Started counseling 12/2015, counselor recommended effexor so I started it 01/02/16.   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  However, unclear etiology.  ENT considering dx of SSNHL or otosclerosis.  ENT rechecking 2 wks as of 08/03/17 office note.   Hypertension    Menometrorrhagia 11/11/2011   Multiple pulmonary nodules 11/20/2020   Lung ca screening CT-->LungRADS 4B, "suspicious"-> referral to Dr. Valeta Harms.  PET 12/12/20 showed low uptake in the nodule. Slight imp on CT 05/2021, plan per pulm is rpt CT 6 mo.   Overweight(278.02) 11/11/2011   Lipids excellent 10/2012   Palpitations 05/2020   Perimenopausal disorder 2013   Sigmoid diverticulosis    on colonoscopy 02/2017   Tobacco dependence in remission    quit 2022    Past Surgical History:  Procedure Laterality Date   CESAREAN SECTION  1991   COLONOSCOPY W/ POLYPECTOMY  03/06/2017   adenomatous polyp: recall 5 yrs    Family History  Problem Relation Age of Onset   COPD Mother        smoker   Emphysema Mother    Hypertension Father    Cancer Father        skin/BCC and prostate   Colon polyps Father    Cancer Maternal Grandmother        ovarian   Emphysema Maternal Grandfather        ?   Heart disease  Paternal Grandmother    Heart disease Paternal Grandfather    Hypertension Paternal Grandfather    Diabetes Paternal Grandfather    Colon cancer Neg Hx    Rectal cancer Neg Hx    Stomach cancer Neg Hx    Esophageal cancer Neg Hx     Social History   Socioeconomic History   Marital status: Married    Spouse name: Not on file   Number of children: Not on file   Years of education: Not on file   Highest education level: Not on file  Occupational History   Not on file  Tobacco Use   Smoking status: Former    Packs/day: 1.00    Years: 40.00    Total pack years: 40.00    Types: Cigarettes    Quit date: 10/2020    Years since quitting: 0.7   Smokeless tobacco: Never  Vaping Use   Vaping Use: Never used  Substance and Sexual Activity   Alcohol use: Yes    Comment: 6-12 beers weekly   Drug use: No   Sexual activity: Yes    Partners: Male  Other Topics Concern   Not on file  Social History Narrative   Monogamous relationship as of 08/2012.   Has one adult son.   Works as a  PT at [employer] in [place].   +Smoker, active as of 11/2014.   +Alcohol 2 beers a day, more on weekends as of 08/2012.   No drugs.         Social Determinants of Health   Financial Resource Strain: Not on file  Food Insecurity: Not on file  Transportation Needs: Not on file  Physical Activity: Not on file  Stress: Not on file  Social Connections: Not on file  Intimate Partner Violence: Not on file    Outpatient Medications Prior to Visit  Medication Sig Dispense Refill   fluticasone-salmeterol (ADVAIR) 250-50 MCG/ACT AEPB Inhale 1 puff into the lungs in the morning and at bedtime. 60 each 6   metoprolol tartrate (LOPRESSOR) 100 MG tablet TAKE 1 TABLET BY MOUTH TWICE A DAY 180 tablet 3   venlafaxine XR (EFFEXOR-XR) 75 MG 24 hr capsule TAKE 1 CAPSULE BY MOUTH DAILY WITH BREAKFAST. 90 capsule 3   albuterol (VENTOLIN HFA) 108 (90 Base) MCG/ACT inhaler INHALE 2 PUFFS INTO THE LUNGS EVERY  4 HOURS AS NEEDED FOR WHEEZE OR FOR SHORTNESS OF BREATH (Patient not taking: Reported on 07/29/2021) 8.5 each 2   hydrochlorothiazide (HYDRODIURIL) 25 MG tablet TAKE 1 TABLET BY MOUTH EVERY DAY 30 tablet 0   lisinopril (ZESTRIL) 40 MG tablet 1 tab po qd 90 tablet 3   predniSONE (DELTASONE) 20 MG tablet 2 tabs po qd x 5d then 1 tab po qd x 5d (Patient not taking: Reported on 07/29/2021) 15 tablet 0   No facility-administered medications prior to visit.    Allergies  Allergen Reactions   Tamiflu [Oseltamivir] Nausea Only    ROS Review of Systems  Constitutional:  Negative for appetite change, chills, fatigue and fever.  HENT:  Negative for congestion, dental problem, ear pain and sore throat.   Eyes:  Negative for discharge, redness and visual disturbance.  Respiratory:  Positive for shortness of breath (DOE and wheezing--chronic). Negative for cough, chest tightness and wheezing.   Cardiovascular:  Negative for chest pain, palpitations and leg swelling.  Gastrointestinal:  Negative for abdominal pain, blood in stool, diarrhea, nausea and vomiting.  Genitourinary:  Negative for difficulty urinating, dysuria, flank pain, frequency, hematuria and urgency.  Musculoskeletal:  Positive for arthralgias (pain in lateral aspects of hips bilat). Negative for back pain, joint swelling, myalgias and neck stiffness.  Skin:  Negative for pallor and rash.  Neurological:  Negative for dizziness, speech difficulty, weakness and headaches.  Hematological:  Negative for adenopathy. Does not bruise/bleed easily.  Psychiatric/Behavioral:  Negative for confusion and sleep disturbance. The patient is not nervous/anxious.     PE;    07/29/2021    1:07 PM 07/02/2021   12:10 PM 02/21/2021    4:00 PM  Vitals with BMI  Height 5' 3.5" 5' 3.5" '5\' 3"'$   Weight 199 lbs 13 oz 199 lbs 13 oz 184 lbs 13 oz  BMI 34.83 81.01 75.10  Systolic 258 527 782  Diastolic 81 76 71  Pulse 61 70 75  Exam chaperoned by Deveron Furlong, CMA.  Gen: Alert, well appearing.  Patient is oriented to person, place, time, and situation. AFFECT: pleasant, lucid thought and speech. ENT: Ears: EACs clear, normal epithelium.  TMs with good light reflex and landmarks bilaterally.  Eyes: no injection, icteris, swelling, or exudate.  EOMI, PERRLA. Nose: no drainage or turbinate edema/swelling.  No injection or focal lesion.  Mouth: lips without lesion/swelling.  Oral mucosa pink and moist.  Dentition intact  and without obvious caries or gingival swelling.  Oropharynx without erythema, exudate, or swelling.  Neck: supple/nontender.  No LAD, mass, or TM.  Carotid pulses 2+ bilaterally, without bruits. CV: RRR, no m/r/g.   LUNGS: Bilat exp wheeze diffusely, prolonged exp phase.  nonlabored resps, mild dec aeration in all lung fields. ABD: soft, NT, ND, BS normal.  No hepatospenomegaly or mass.  No bruits. EXT: no clubbing, cyanosis, or edema.  Musculoskeletal: Mild TTP focally over greater troch bilat. FABER and FADIR normal/neg. no joint swelling, erythema, warmth, or tenderness.  ROM of all joints intact. Skin - no sores or suspicious lesions or rashes or color changes  Pertinent labs:  Lab Results  Component Value Date   TSH 1.05 06/20/2020   Lab Results  Component Value Date   WBC 6.4 06/20/2020   HGB 14.9 06/20/2020   HCT 44.3 06/20/2020   MCV 93.9 06/20/2020   PLT 324 06/20/2020   Lab Results  Component Value Date   CREATININE 0.67 06/20/2020   BUN 7 06/20/2020   NA 132 (L) 06/20/2020   K 4.5 06/20/2020   CL 92 (L) 06/20/2020   CO2 26 06/20/2020   Lab Results  Component Value Date   ALT 21 06/20/2020   AST 29 06/20/2020   ALKPHOS 88 06/07/2019   BILITOT 0.5 06/20/2020   Lab Results  Component Value Date   CHOL 163 06/20/2020   Lab Results  Component Value Date   HDL 93 06/20/2020   Lab Results  Component Value Date   LDLCALC 55 06/20/2020   Lab Results  Component Value Date   TRIG 66 06/20/2020    Lab Results  Component Value Date   CHOLHDL 1.8 06/20/2020    ASSESSMENT AND PLAN:   #1 hypertension, well controlled on Lopressor 100 mg--which she takes only once a day. Also lisinopril 40 mg a day and HCTZ 25 mg a day. Electrolytes and creatinine today.  2.  COPD: She is more faithfully taking her Advair 1 puff twice a day and using her albuterol fairly regularly.  She did quit smoking last year. She has pulmonary function test scheduled in 2 days per her pulmonologist  #3 weight gain. She is going to seek appointment at [facility] to get a more detailed diet plan.  #4 deep gluteal syndrome/tendinopathy bilaterally. Discussed home stretches and strengthening for these areas. Handout given. Discussed possible need for PT and/or steroid injection in the future should this persistently worsen.  #5 Health maintenance exam: Reviewed age and gender appropriate health maintenance issues (prudent diet, regular exercise, health risks of tobacco and excessive alcohol, use of seatbelts, fire alarms in home, use of sunscreen).  Also reviewed age and gender appropriate health screening as well as vaccine recommendations. Vaccines: Prevnar 20->she deferred today.  Shingrix --deferred in the past-->declined today.. Labs: fasting HP labs ordered + Hba1c (diabetes screening) Cervical ca screening: last pap 11/06/15--> plans as per GYN-->pt states 5 yr interval at this time. Breast ca screening: last mammo was 04/08/19-->normal.  Repeat was due 03/2020-->ordered. Colon ca screening: next colonoscopy due 2024.  An After Visit Summary was printed and given to the patient.  FOLLOW UP:  Return in about 6 months (around 01/29/2022) for routine chronic illness f/u.  Signed:  Crissie Sickles, MD           07/29/2021

## 2021-07-29 NOTE — Addendum Note (Signed)
Addended by: Octaviano Glow on: 07/29/2021 03:50 PM   Modules accepted: Orders

## 2021-07-29 NOTE — Patient Instructions (Signed)
Hip Bursitis Rehab Ask your health care provider which exercises are safe for you. Do exercises exactly as told by your health care provider and adjust them as directed. It is normal to feel mild stretching, pulling, tightness, or discomfort as you do these exercises. Stop right away if you feel sudden pain or your pain gets worse. Do not begin these exercises until told by your health care provider. Stretching exercise This exercise warms up your muscles and joints and improves the movement and flexibility of your hip. This exercise also helps to relieve pain and stiffness. Iliotibial band stretch An iliotibial band is a strong band of muscle tissue that runs from the outer side of your hip to the outer side of your thigh and knee. Lie on your side with your left / right leg in the top position. Bend your left / right knee and grab your ankle. Stretch out your bottom arm to help you balance. Slowly bring your knee back so your thigh is slightly behind your body. Slowly lower your knee toward the floor until you feel a gentle stretch on the outside of your left / right thigh. If you do not feel a stretch and your knee will not lower more toward the floor, place the heel of your other foot on top of your knee and pull your knee down toward the floor with your foot. Hold this position for __________ seconds. Slowly return to the starting position. Repeat __________ times. Complete this exercise __________ times a day. Strengthening exercises These exercises build strength and endurance in your hip and pelvis. Endurance is the ability to use your muscles for a long time, even after they get tired. Bridge This exercise strengthens the muscles that move your thigh backward (hip extensors). Lie on your back on a firm surface with your knees bent and your feet flat on the floor. Tighten your buttocks muscles and lift your buttocks off the floor until your trunk is level with your thighs. Do not arch your  back. You should feel the muscles working in your buttocks and the back of your thighs. If you do not feel these muscles, slide your feet 1-2 inches (2.5-5 cm) farther away from your buttocks. If this exercise is too easy, try doing it with your arms crossed over your chest. Hold this position for __________ seconds. Slowly lower your hips to the starting position. Let your muscles relax completely after each repetition. Repeat __________ times. Complete this exercise __________ times a day. Squats This exercise strengthens the muscles in front of your thigh and knee (quadriceps). Stand in front of a table, with your feet and knees pointing straight ahead. You may rest your hands on the table for balance but not for support. Slowly bend your knees and lower your hips like you are going to sit in a chair. Keep your weight over your heels, not over your toes. Keep your lower legs upright so they are parallel with the table legs. Do not let your hips go lower than your knees. Do not bend lower than told by your health care provider. If your hip pain increases, do not bend as low. Hold the squat position for __________ seconds. Slowly push with your legs to return to standing. Do not use your hands to pull yourself to standing. Repeat __________ times. Complete this exercise __________ times a day. Hip hike  Stand sideways on a bottom step. Stand on your left / right leg with your other foot unsupported next to   the step. You can hold on to the railing or wall for balance if needed. Keep your knees straight and your torso square. Then lift your left / right hip up toward the ceiling. Hold this position for __________ seconds. Slowly let your left / right hip lower toward the floor, past the starting position. Your foot should get closer to the floor. Do not lean or bend your knees. Repeat __________ times. Complete this exercise __________ times a day. Single leg stand This exercise increases  your balance. Without shoes, stand near a railing or in a doorway. You may hold on to the railing or door frame as needed for balance. Squeeze your left / right buttock muscles, then lift up your other foot. Do not let your left / right hip push out to the side. It is helpful to stand in front of a mirror for this exercise so you can watch your hip. Hold this position for __________ seconds. Repeat __________ times. Complete this exercise __________ times a day. This information is not intended to replace advice given to you by your health care provider. Make sure you discuss any questions you have with your health care provider. Document Revised: 12/19/2020 Document Reviewed: 12/19/2020 Elsevier Patient Education  2023 [employer]  

## 2021-07-29 NOTE — Telephone Encounter (Signed)
LM for pt regarding labs. Unfortunately there was a mishap with her labs, pt will need to return for labs at earliest convenience. Please schedule pt

## 2021-07-31 ENCOUNTER — Ambulatory Visit (INDEPENDENT_AMBULATORY_CARE_PROVIDER_SITE_OTHER): Payer: No Typology Code available for payment source | Admitting: Pulmonary Disease

## 2021-07-31 DIAGNOSIS — R0600 Dyspnea, unspecified: Secondary | ICD-10-CM | POA: Diagnosis not present

## 2021-07-31 LAB — PULMONARY FUNCTION TEST
DL/VA % pred: 120 %
DL/VA: 5.13 ml/min/mmHg/L
DLCO cor % pred: 103 %
DLCO cor: 20.56 ml/min/mmHg
DLCO unc % pred: 103 %
DLCO unc: 20.56 ml/min/mmHg
FEF 25-75 Post: 0.71 L/sec
FEF 25-75 Pre: 0.59 L/sec
FEF2575-%Change-Post: 20 %
FEF2575-%Pred-Post: 29 %
FEF2575-%Pred-Pre: 24 %
FEV1-%Change-Post: 5 %
FEV1-%Pred-Post: 51 %
FEV1-%Pred-Pre: 48 %
FEV1-Post: 1.3 L
FEV1-Pre: 1.24 L
FEV1FVC-%Change-Post: 0 %
FEV1FVC-%Pred-Pre: 76 %
FEV6-%Change-Post: 4 %
FEV6-%Pred-Post: 67 %
FEV6-%Pred-Pre: 65 %
FEV6-Post: 2.14 L
FEV6-Pre: 2.05 L
FEV6FVC-%Change-Post: 0 %
FEV6FVC-%Pred-Post: 103 %
FEV6FVC-%Pred-Pre: 103 %
FVC-%Change-Post: 4 %
FVC-%Pred-Post: 65 %
FVC-%Pred-Pre: 63 %
FVC-Post: 2.14 L
FVC-Pre: 2.06 L
Post FEV1/FVC ratio: 61 %
Post FEV6/FVC ratio: 100 %
Pre FEV1/FVC ratio: 60 %
Pre FEV6/FVC Ratio: 100 %
RV % pred: 170 %
RV: 3.2 L
TLC % pred: 112 %
TLC: 5.52 L

## 2021-07-31 NOTE — Patient Instructions (Signed)
Full PFT Performed Today  

## 2021-07-31 NOTE — Progress Notes (Signed)
Full PFT Performed Today  

## 2021-09-03 ENCOUNTER — Telehealth: Payer: Self-pay | Admitting: Acute Care

## 2021-09-03 NOTE — Telephone Encounter (Signed)
Pt is requesting the CT order be sent to [place], her insurance offers this benefit so as to it could be cost free to pt, Fax# [phone]  Phone# [phone] Contact person Jinny Blossom, does not want to cancel the appt with cone until hearing from [place] call   CT was ordered by Judson Roch and scheduled on 09/28/2021.  Please advise

## 2021-09-05 NOTE — Telephone Encounter (Signed)
Fax confirmation received I have also made the patient aware of the appt

## 2021-09-05 NOTE — Telephone Encounter (Signed)
Order has been faxed

## 2021-09-25 ENCOUNTER — Telehealth: Payer: Self-pay | Admitting: Acute Care

## 2021-09-25 NOTE — Telephone Encounter (Signed)
Patient called to request a location change for her CT scan because of her insurance.  The new location should be [facility].  Please call patient to confirm.  CB# [phone]

## 2021-09-28 ENCOUNTER — Other Ambulatory Visit: Payer: No Typology Code available for payment source

## 2021-09-30 ENCOUNTER — Other Ambulatory Visit (HOSPITAL_BASED_OUTPATIENT_CLINIC_OR_DEPARTMENT_OTHER): Payer: No Typology Code available for payment source

## 2021-10-01 NOTE — Telephone Encounter (Signed)
Kristin Day - will you please get this authorized at [facility].

## 2021-10-28 ENCOUNTER — Other Ambulatory Visit: Payer: Self-pay | Admitting: Family Medicine

## 2021-11-04 ENCOUNTER — Ambulatory Visit: Payer: No Typology Code available for payment source

## 2021-11-05 ENCOUNTER — Ambulatory Visit: Payer: No Typology Code available for payment source

## 2021-11-13 ENCOUNTER — Encounter: Payer: Self-pay | Admitting: Pulmonary Disease

## 2021-11-13 ENCOUNTER — Ambulatory Visit (INDEPENDENT_AMBULATORY_CARE_PROVIDER_SITE_OTHER): Payer: No Typology Code available for payment source | Admitting: Pulmonary Disease

## 2021-11-13 VITALS — BP 120/70 | HR 75 | Ht 63.5 in | Wt 200.0 lb

## 2021-11-13 DIAGNOSIS — J479 Bronchiectasis, uncomplicated: Secondary | ICD-10-CM | POA: Diagnosis not present

## 2021-11-13 DIAGNOSIS — R0600 Dyspnea, unspecified: Secondary | ICD-10-CM | POA: Diagnosis not present

## 2021-11-13 DIAGNOSIS — Z87891 Personal history of nicotine dependence: Secondary | ICD-10-CM

## 2021-11-13 DIAGNOSIS — J449 Chronic obstructive pulmonary disease, unspecified: Secondary | ICD-10-CM | POA: Diagnosis not present

## 2021-11-13 DIAGNOSIS — R911 Solitary pulmonary nodule: Secondary | ICD-10-CM

## 2021-11-13 MED ORDER — TRELEGY ELLIPTA 100-62.5-25 MCG/ACT IN AEPB: 1.0000 | INHALATION_SPRAY | Freq: Once | RESPIRATORY_TRACT | 11 refills | Status: AC

## 2021-11-13 NOTE — Progress Notes (Signed)
Synopsis: Referred in November 2022 for lung nodule by Tammi Sou, MD  Subjective:   PATIENT ID: Kristin Day GENDER: female DOB: October 26, 1965, MRN: [id number]  Chief Complaint  Patient presents with   Follow-up    This is a 56 year old female, past medical history of hypertension, tobacco dependence, palpitations.  Patient had a lung cancer screening CT in November 2022.Patient's lung cancer screening CT revealed a 19.3 mm right medial portion of the right middle lobe lesion.  Also has areas of bronchiectasis.  We reviewed her CT imaging today in the office.  She does not have any hemoptysis.  Mother with COPD.  No family history of lung cancer.  OV 11/13/2021: Here today for follow-up.  She had repeat CT imaging of the chest that was completed at [facility].  I am able to review the report in epic care everywhere but not able to review the images.  The report shows that she has no significant adenopathy and no new nodules.  Everything looks stable some evidence possible MAI.  Reviewed her pulmonary function test which revealed obstruction with a FEV1 of 51% predicted.  Currently on Wixela.  Still has dyspnea on exertion and shortness of breath.    Past Medical History:  Diagnosis Date   Anxiety and depression 11/11/2011   Started counseling 12/2015, counselor recommended effexor so I started it 01/02/16.   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  However, unclear etiology.  ENT considering dx of SSNHL or otosclerosis.  ENT rechecking 2 wks as of 08/03/17 office note.   Hypertension    Menometrorrhagia 11/11/2011   Multiple pulmonary nodules 11/20/2020   Lung ca screening CT-->LungRADS 4B, "suspicious"-> referral to Dr. Valeta Harms.  PET 12/12/20 showed low uptake in the nodule. Slight imp on CT 05/2021, plan per pulm is rpt CT 6 mo.   Overweight(278.02) 11/11/2011   Lipids excellent 10/2012   Palpitations 05/2020   Perimenopausal  disorder 2013   Sigmoid diverticulosis    on colonoscopy 02/2017   Tobacco dependence in remission    quit 2022     Family History  Problem Relation Age of Onset   COPD Mother        smoker   Emphysema Mother    Hypertension Father    Cancer Father        skin/BCC and prostate   Colon polyps Father    Cancer Maternal Grandmother        ovarian   Emphysema Maternal Grandfather        ?   Heart disease Paternal Grandmother    Heart disease Paternal Grandfather    Hypertension Paternal Grandfather    Diabetes Paternal Grandfather    Colon cancer Neg Hx    Rectal cancer Neg Hx    Stomach cancer Neg Hx    Esophageal cancer Neg Hx      Past Surgical History:  Procedure Laterality Date   CESAREAN SECTION  1991   COLONOSCOPY W/ POLYPECTOMY  03/06/2017   adenomatous polyp: recall 5 yrs    Social History   Socioeconomic History   Marital status: Married    Spouse name: Not on file   Number of children: Not on file   Years of education: Not on file   Highest education level: Not on file  Occupational History   Not on file  Tobacco Use   Smoking status: Former    Packs/day: 1.00  Years: 40.00    Total pack years: 40.00    Types: Cigarettes    Quit date: 10/2020    Years since quitting: 1.0   Smokeless tobacco: Never  Vaping Use   Vaping Use: Never used  Substance and Sexual Activity   Alcohol use: Yes    Comment: 6-12 beers weekly   Drug use: No   Sexual activity: Yes    Partners: Male  Other Topics Concern   Not on file  Social History Narrative   Monogamous relationship as of 08/2012.   Has one adult son.   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   +Smoker, active as of 11/2014.   +Alcohol 2 beers a day, more on weekends as of 08/2012.   No drugs.         Social Determinants of Health   Financial Resource Strain: Not on file  Food Insecurity: Not on file  Transportation Needs: Not on file  Physical Activity: Not on file  Stress: Not  on file  Social Connections: Not on file  Intimate Partner Violence: Not on file     Allergies  Allergen Reactions   Tamiflu [Oseltamivir] Nausea Only     Outpatient Medications Prior to Visit  Medication Sig Dispense Refill   albuterol (VENTOLIN HFA) 108 (90 Base) MCG/ACT inhaler INHALE 2 PUFFS INTO THE LUNGS EVERY 4 HOURS AS NEEDED FOR WHEEZE OR FOR SHORTNESS OF BREATH 8.5 each 2   WIXELA INHUB 250-50 MCG/ACT AEPB INHALE 1 PUFF INTO THE LUNGS IN THE MORNING AND AT BEDTIME. 60 each 3   hydrochlorothiazide (HYDRODIURIL) 25 MG tablet Take 1 tablet (25 mg total) by mouth daily. 90 tablet 1   lisinopril (ZESTRIL) 40 MG tablet 1 tab po qd 90 tablet 1   metoprolol tartrate (LOPRESSOR) 100 MG tablet TAKE 1 TABLET BY MOUTH TWICE A DAY 180 tablet 3   venlafaxine XR (EFFEXOR-XR) 75 MG 24 hr capsule TAKE 1 CAPSULE BY MOUTH DAILY WITH BREAKFAST. 90 capsule 3   No facility-administered medications prior to visit.    Review of Systems  Constitutional:  Negative for chills, fever, malaise/fatigue and weight loss.  HENT:  Negative for hearing loss, sore throat and tinnitus.   Eyes:  Negative for blurred vision and double vision.  Respiratory:  Positive for cough and shortness of breath. Negative for hemoptysis, sputum production, wheezing and stridor.   Cardiovascular:  Negative for chest pain, palpitations, orthopnea, leg swelling and PND.  Gastrointestinal:  Negative for abdominal pain, constipation, diarrhea, heartburn, nausea and vomiting.  Genitourinary:  Negative for dysuria, hematuria and urgency.  Musculoskeletal:  Negative for joint pain and myalgias.  Skin:  Negative for itching and rash.  Neurological:  Negative for dizziness, tingling, weakness and headaches.  Endo/Heme/Allergies:  Negative for environmental allergies. Does not bruise/bleed easily.  Psychiatric/Behavioral:  Negative for depression. The patient is not nervous/anxious and does not have insomnia.   All other systems  reviewed and are negative.    Objective:  Physical Exam Vitals reviewed.  Constitutional:      General: She is not in acute distress.    Appearance: She is well-developed. She is obese.  HENT:     Head: Normocephalic and atraumatic.  Eyes:     General: No scleral icterus.    Conjunctiva/sclera: Conjunctivae normal.     Pupils: Pupils are equal, round, and reactive to light.  Neck:     Vascular: No JVD.     Trachea: No tracheal  deviation.  Cardiovascular:     Rate and Rhythm: Normal rate and regular rhythm.     Heart sounds: Normal heart sounds. No murmur heard. Pulmonary:     Effort: Pulmonary effort is normal. No tachypnea, accessory muscle usage or respiratory distress.     Breath sounds: No stridor. No wheezing, rhonchi or rales.     Comments: Diminished breath sounds bilaterally Abdominal:     General: Bowel sounds are normal. There is no distension.     Palpations: Abdomen is soft.     Tenderness: There is no abdominal tenderness.  Musculoskeletal:        General: No tenderness.     Cervical back: Neck supple.  Lymphadenopathy:     Cervical: No cervical adenopathy.  Skin:    General: Skin is warm and dry.     Capillary Refill: Capillary refill takes less than 2 seconds.     Findings: No rash.  Neurological:     Mental Status: She is alert and oriented to person, place, and time.  Psychiatric:        Behavior: Behavior normal.      Vitals:   11/13/21 1108  BP: 120/70  Pulse: 75  SpO2: 99%  Weight: 200 lb (90.7 kg)  Height: 5' 3.5" (1.613 m)   99% on RA BMI Readings from Last 3 Encounters:  11/13/21 34.87 kg/m  07/29/21 34.84 kg/m  07/02/21 34.84 kg/m   Wt Readings from Last 3 Encounters:  11/13/21 200 lb (90.7 kg)  07/29/21 199 lb 12.8 oz (90.6 kg)  07/02/21 199 lb 12.8 oz (90.6 kg)     CBC    Component Value Date/Time   WBC 6.4 06/20/2020 0835   RBC 4.72 06/20/2020 0835   HGB 14.9 06/20/2020 0835   HGB 14.6 01/15/2018 0823   HCT 44.3  06/20/2020 0835   HCT 43.4 01/15/2018 0823   PLT 324 06/20/2020 0835   PLT 336 01/15/2018 0823   MCV 93.9 06/20/2020 0835   MCV 94 01/15/2018 0823   MCH 31.6 06/20/2020 0835   MCHC 33.6 06/20/2020 0835   RDW 12.2 06/20/2020 0835   RDW 11.7 (L) 01/15/2018 0823   LYMPHSABS 1,843 06/20/2020 0835   LYMPHSABS 1.6 01/15/2018 0823   MONOABS 0.5 06/07/2019 1001   EOSABS 141 06/20/2020 0835   EOSABS 0.1 01/15/2018 0823   BASOSABS 51 06/20/2020 0835   BASOSABS 0.1 01/15/2018 0823     Chest Imaging: November 20, 2020 lung cancer screening CT: 19 mm medial right lung lung nodule.  Concerning for possible underlying malignancy.  Other areas of bronchiectasis and smaller scattered inflammatory appearing nodules. The patient's images have been independently reviewed by me.    CT chest October 2023 report reviewed in epic care [facility] from [facility]. No new nodules, stable.  No adenopathy. Unable to review images not pushed to PACS.  Pulmonary Functions Testing Results:    Latest Ref Rng & Units 07/31/2021    4:01 PM  PFT Results  FVC-Pre L 2.06   FVC-Predicted Pre % 63   FVC-Post L 2.14   FVC-Predicted Post % 65   Pre FEV1/FVC % % 60   Post FEV1/FCV % % 61   FEV1-Pre L 1.24   FEV1-Predicted Pre % 48   FEV1-Post L 1.30   DLCO uncorrected ml/min/mmHg 20.56   DLCO UNC% % 103   DLCO corrected ml/min/mmHg 20.56   DLCO COR %Predicted % 103   DLVA Predicted % 120   TLC L 5.52  TLC % Predicted % 112   RV % Predicted % 170    FeNO:   Pathology:   Echocardiogram:   Heart Catheterization:     Assessment & Plan:     ICD-10-CM   1. Dyspnea, unspecified type  R06.00     2. Lung nodule seen on imaging study  R91.1     3. Bronchiectasis without complication ([place])  B26.2     4. Stage 2 moderate COPD by GOLD classification ([place])  J44.9     5. Former smoker  Z87.891        Discussion:  This is a 56 year old female, medial right lung nodule initially seen on lung cancer  screening CT areas of small bronchiectasis.  Follow-up imaging appeared stable.  She had repeat imaging complete in October that was done at [facility].  I reviewed the report with the patient today.  I do not have access to review the images.  We also reviewed her pulmonary function test which shows a new diagnosis of stage II COPD with an FEV1 of 51% predicted.  Plan: We will step up her Wixela to triple therapy, Trelegy 100, daily Patient will need to reenroll in her lung cancer screening program to start in October 2024. Encouraged continued smoking cessation. Follow-up with [place] in 1 year or as needed.   Current Outpatient Medications:    albuterol (VENTOLIN HFA) 108 (90 Base) MCG/ACT inhaler, INHALE 2 PUFFS INTO THE LUNGS EVERY 4 HOURS AS NEEDED FOR WHEEZE OR FOR SHORTNESS OF BREATH, Disp: 8.5 each, Rfl: 2   Fluticasone-Umeclidin-Vilant (TRELEGY ELLIPTA) 100-62.5-25 MCG/ACT AEPB, Inhale 1 puff into the lungs once for 1 dose., Disp: 1 each, Rfl: 11   WIXELA INHUB 250-50 MCG/ACT AEPB, INHALE 1 PUFF INTO THE LUNGS IN THE MORNING AND AT BEDTIME., Disp: 60 each, Rfl: 3   hydrochlorothiazide (HYDRODIURIL) 25 MG tablet, Take 1 tablet (25 mg total) by mouth daily., Disp: 90 tablet, Rfl: 1   lisinopril (ZESTRIL) 40 MG tablet, 1 tab po qd, Disp: 90 tablet, Rfl: 1   metoprolol tartrate (LOPRESSOR) 100 MG tablet, TAKE 1 TABLET BY MOUTH TWICE A DAY, Disp: 180 tablet, Rfl: 3   venlafaxine XR (EFFEXOR-XR) 75 MG 24 hr capsule, TAKE 1 CAPSULE BY MOUTH DAILY WITH BREAKFAST., Disp: 90 capsule, Rfl: 3   Garner Nash, DO [facility] 11/13/2021 11:39 AM

## 2021-11-13 NOTE — Patient Instructions (Addendum)
Thank you for visiting Dr. Valeta Harms at [facility]. Today we recommend the following:   Meds ordered this encounter  Medications   Fluticasone-Umeclidin-Vilant (TRELEGY ELLIPTA) 100-62.5-25 MCG/ACT AEPB    Sig: Inhale 1 puff into the lungs once for 1 dose.    Dispense:  1 each    Refill:  11   Return in about 1 year (around 11/14/2022) for with Eric Form, NP, or Dr. Valeta Harms.  You should have repeat LDCT screening in Oct 2024    Please do your part to reduce the spread of COVID-19.

## 2021-11-14 ENCOUNTER — Telehealth (HOSPITAL_BASED_OUTPATIENT_CLINIC_OR_DEPARTMENT_OTHER): Payer: Self-pay

## 2021-11-14 ENCOUNTER — Other Ambulatory Visit: Payer: Self-pay | Admitting: *Deleted

## 2021-11-14 DIAGNOSIS — Z87891 Personal history of nicotine dependence: Secondary | ICD-10-CM

## 2021-11-14 DIAGNOSIS — Z122 Encounter for screening for malignant neoplasm of respiratory organs: Secondary | ICD-10-CM

## 2021-11-14 DIAGNOSIS — F17210 Nicotine dependence, cigarettes, uncomplicated: Secondary | ICD-10-CM

## 2021-11-21 ENCOUNTER — Ambulatory Visit
Admission: RE | Admit: 2021-11-21 | Discharge: 2021-11-21 | Disposition: A | Discharge status: Home / Self Care | Payer: No Typology Code available for payment source | Source: Ambulatory Visit | Attending: Family Medicine | Admitting: Family Medicine

## 2021-11-21 DIAGNOSIS — Z1231 Encounter for screening mammogram for malignant neoplasm of breast: Secondary | ICD-10-CM

## 2021-12-31 ENCOUNTER — Other Ambulatory Visit: Payer: Self-pay | Admitting: Family Medicine

## 2022-01-22 ENCOUNTER — Other Ambulatory Visit: Payer: Self-pay | Admitting: Family Medicine

## 2022-01-24 ENCOUNTER — Telehealth: Payer: Self-pay | Admitting: Family Medicine

## 2022-01-24 DIAGNOSIS — Z1211 Encounter for screening for malignant neoplasm of colon: Secondary | ICD-10-CM

## 2022-01-24 NOTE — Telephone Encounter (Signed)
Referral placed, patient advised referral completed.

## 2022-01-24 NOTE — Telephone Encounter (Signed)
Patient called and is requesting a referral to a [facility] dr.  based on her new insurance. She reports that she is overdue for her 5 year colonoscopy. The number to [facility] [phone] and the fax is [phone]

## 2022-01-30 ENCOUNTER — Other Ambulatory Visit: Payer: Self-pay | Admitting: Family Medicine

## 2022-02-03 ENCOUNTER — Encounter: Payer: Self-pay | Admitting: Gastroenterology

## 2022-02-03 ENCOUNTER — Other Ambulatory Visit: Payer: Self-pay | Admitting: Family Medicine

## 2022-02-15 ENCOUNTER — Other Ambulatory Visit: Payer: Self-pay | Admitting: Family Medicine

## 2022-02-18 ENCOUNTER — Ambulatory Visit (INDEPENDENT_AMBULATORY_CARE_PROVIDER_SITE_OTHER): Payer: No Typology Code available for payment source | Admitting: Family Medicine

## 2022-02-18 ENCOUNTER — Encounter: Payer: Self-pay | Admitting: Family Medicine

## 2022-02-18 VITALS — BP 124/79 | HR 61 | Temp 97.7°F | Ht 63.5 in | Wt 204.0 lb

## 2022-02-18 DIAGNOSIS — J209 Acute bronchitis, unspecified: Secondary | ICD-10-CM

## 2022-02-18 DIAGNOSIS — Z131 Encounter for screening for diabetes mellitus: Secondary | ICD-10-CM | POA: Diagnosis not present

## 2022-02-18 DIAGNOSIS — J440 Chronic obstructive pulmonary disease with acute lower respiratory infection: Secondary | ICD-10-CM | POA: Diagnosis not present

## 2022-02-18 DIAGNOSIS — I10 Essential (primary) hypertension: Secondary | ICD-10-CM

## 2022-02-18 LAB — LIPID PANEL
Cholesterol: 147 mg/dL (ref 0–200)
HDL: 68.1 mg/dL (ref >39.00)
LDL Cholesterol: 68 mg/dL (ref 0–99)
NonHDL: 78.99
Total CHOL/HDL Ratio: 2
Triglycerides: 57 mg/dL (ref 0.0–149.0)
VLDL: 11.4 mg/dL (ref 0.0–40.0)

## 2022-02-18 LAB — COMPREHENSIVE METABOLIC PANEL
ALT: 20 U/L (ref 0–35)
AST: 22 U/L (ref 0–37)
Albumin: 4.1 g/dL (ref 3.5–5.2)
Alkaline Phosphatase: 87 U/L (ref 39–117)
BUN: 9 mg/dL (ref 6–23)
CO2: 28 mEq/L (ref 19–32)
Calcium: 9.1 mg/dL (ref 8.4–10.5)
Chloride: 98 mEq/L (ref 96–112)
Creatinine, Ser: 0.65 mg/dL (ref 0.40–1.20)
GFR: 97.58 mL/min (ref >60.00)
Glucose, Bld: 84 mg/dL (ref 70–99)
Potassium: 4.8 mEq/L (ref 3.5–5.1)
Sodium: 135 mEq/L (ref 135–145)
Total Bilirubin: 0.4 mg/dL (ref 0.2–1.2)
Total Protein: 7.2 g/dL (ref 6.0–8.3)

## 2022-02-18 LAB — CBC
HCT: 37.5 % (ref 36.0–46.0)
Hemoglobin: 12.8 g/dL (ref 12.0–15.0)
MCHC: 34 g/dL (ref 30.0–36.0)
MCV: 88 fl (ref 78.0–100.0)
Platelets: 333 10*3/uL (ref 150.0–400.0)
RBC: 4.27 Mil/uL (ref 3.87–5.11)
RDW: 12.9 % (ref 11.5–15.5)
WBC: 8.5 10*3/uL (ref 4.0–10.5)

## 2022-02-18 LAB — HEMOGLOBIN A1C: Hgb A1c MFr Bld: 5.7 % (ref 4.6–6.5)

## 2022-02-18 LAB — TSH: TSH: 1.38 u[IU]/mL (ref 0.35–5.50)

## 2022-02-18 MED ORDER — LISINOPRIL 40 MG PO TABS: ORAL_TABLET | ORAL | 1 refills | Status: DC

## 2022-02-18 MED ORDER — PREDNISONE 10 MG PO TABS: ORAL_TABLET | ORAL | 0 refills | Status: DC

## 2022-02-18 MED ORDER — AZITHROMYCIN 250 MG PO TABS: ORAL_TABLET | ORAL | 0 refills | Status: DC

## 2022-02-18 MED ORDER — HYDROCHLOROTHIAZIDE 25 MG PO TABS: 25.0000 mg | ORAL_TABLET | Freq: Every day | ORAL | 1 refills | Status: DC

## 2022-02-18 NOTE — Progress Notes (Signed)
OFFICE VISIT  02/18/2022  CC:  Chief Complaint  Patient presents with   Medical Management of Chronic Issues    Pt is fasting   Patient is a 57 y.o. female who presents for 96-monthfollow-up hypertension. A/P as of last visit: "1 hypertension, well controlled on Lopressor 100 mg--which she takes only once a day. Also lisinopril 40 mg a day and HCTZ 25 mg a day. Electrolytes and creatinine today.  2.  COPD: She is more faithfully taking her Advair 1 puff twice a day and using her albuterol fairly regularly.  She did quit smoking last year. She has pulmonary function test scheduled in 2 days per her pulmonologist   #3 weight gain. [facility] to get a more detailed diet plan.   #4 deep gluteal syndrome/tendinopathy bilaterally. Discussed home stretches and strengthening for these areas. Handout given. Discussed possible need for PT and/or steroid injection in the future should this persistently worsen.   #5 Health maintenance exam: Reviewed age and gender appropriate health maintenance issues (prudent diet, regular exercise, health risks of tobacco and excessive alcohol, use of seatbelts, fire alarms in home, use of sunscreen).  Also reviewed age and gender appropriate health screening as well as vaccine recommendations. Vaccines: Prevnar 20->she deferred today.  Shingrix --deferred in the past-->declined today.. Labs: fasting HP labs ordered + Hba1c (diabetes screening) Cervical ca screening: last pap 11/06/15--> plans as per GYN-->pt states 5 yr interval at this time. Breast ca screening: last mammo was 04/08/19-->normal.  Repeat was due 03/2020-->ordered. Colon ca screening: next colonoscopy due 2024."  INTERIM HX: DRochelhas had 6 weeks of significantly increased cough, increased mucus production, and change in her mucus to a dark green color.  Occasional wheezing.  No chest pain, no feeling of shortness of breath.  No fever.   She is not smoking cigarettes.  Pulmonary follow-up 11/13/2021--follow-up imaging for her lung nodule was stable. Her Wixela was stepped up to Trelegy 100 daily. She will get another CT for lung cancer screening in October of this year.  Home blood pressures are consistently normal.  ROS as above, plus-->  no dizziness, no HAs, no rashes, no melena/hematochezia.  No polyuria or polydipsia.  No myalgias or arthralgias.  No focal weakness, paresthesias, or tremors.  No acute vision or hearing abnormalities.  No dysuria or unusual/new urinary urgency or frequency.  No recent changes in lower legs. No n/v/d or abd pain.  No palpitations.    Past Medical History:  Diagnosis Date   Anxiety and depression 11/11/2011   Started counseling 12/2015, counselor recommended effexor so I started it 01/02/16.   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  However, unclear etiology.  ENT considering dx of SSNHL or otosclerosis.  ENT rechecking 2 wks as of 08/03/17 office note.   Hypertension    Menometrorrhagia 11/11/2011   Multiple pulmonary nodules 11/20/2020   Lung ca screening CT-->LungRADS 4B, "suspicious"-> referral to Dr. IValeta Harms  PET 12/12/20 showed low uptake in the nodule. Slight imp on CT 05/2021, plan per pulm is rpt CT 6 mo.   Overweight(278.02) 11/11/2011   Lipids excellent 10/2012   Palpitations 05/2020   Perimenopausal disorder 2013   Sigmoid diverticulosis    on colonoscopy 02/2017   Tobacco dependence in remission    quit 2022    Past Surgical History:  Procedure Laterality Date   [place]  COLONOSCOPY W/ POLYPECTOMY  03/06/2017   adenomatous polyp: recall 5 yrs    Outpatient Medications Prior to Visit  Medication Sig Dispense Refill   albuterol (VENTOLIN HFA) 108 (90 Base) MCG/ACT inhaler INHALE 2 PUFFS INTO THE LUNGS EVERY 4 HOURS AS NEEDED FOR WHEEZE OR FOR SHORTNESS OF BREATH 8.5 each 2   Fluticasone-Umeclidin-Vilant (TRELEGY  ELLIPTA) 100-62.5-25 MCG/ACT AEPB Inhale 1 puff into the lungs daily.     metoprolol tartrate (LOPRESSOR) 100 MG tablet TAKE 1 TABLET BY MOUTH TWICE A DAY 180 tablet 3   venlafaxine XR (EFFEXOR-XR) 75 MG 24 hr capsule TAKE 1 CAPSULE BY MOUTH DAILY WITH BREAKFAST. 90 capsule 3   WIXELA INHUB 250-50 MCG/ACT AEPB INHALE 1 PUFF INTO THE LUNGS IN THE MORNING AND AT BEDTIME. 60 each 3   hydrochlorothiazide (HYDRODIURIL) 25 MG tablet TAKE 1 TABLET (25 MG TOTAL) BY MOUTH DAILY. 30 tablet 0   lisinopril (ZESTRIL) 40 MG tablet TAKE 1 TABLET BY MOUTH EVERY DAY 30 tablet 0   No facility-administered medications prior to visit.    Allergies  Allergen Reactions   Tamiflu [Oseltamivir] Nausea Only    Review of Systems As per HPI  PE:    02/18/2022   10:54 AM 11/13/2021   11:08 AM 07/29/2021    1:07 PM  Vitals with BMI  Height 5' 3.5" 5' 3.5" 5' 3.5"  Weight 204 lbs 200 lbs 199 lbs 13 oz  BMI 35.57 93.23 55.73  Systolic 220 254 270  Diastolic 79 70 81  Pulse 61 75 61     Physical Exam  Gen: Alert, well appearing.  Patient is oriented to person, place, time, and situation. AFFECT: pleasant, lucid thought and speech. CV: RRR, no m/r/g.   LUNGS: Some inspiratory rhonchi are present, occasional expiratory wheeze.  These noises clear with forceful coughing.  Aeration is minimally decreased.  Expiratory phase is not prolonged.  Breathing is nonlabored.  LABS:  Last CBC Lab Results  Component Value Date   WBC 6.4 06/20/2020   HGB 14.9 06/20/2020   HCT 44.3 06/20/2020   MCV 93.9 06/20/2020   MCH 31.6 06/20/2020   RDW 12.2 06/20/2020   PLT 324 62/37/6283   Last metabolic panel Lab Results  Component Value Date   GLUCOSE 92 06/20/2020   NA 132 (L) 06/20/2020   K 4.5 06/20/2020   CL 92 (L) 06/20/2020   CO2 26 06/20/2020   BUN 7 06/20/2020   CREATININE 0.67 06/20/2020   GFRNONAA >60 07/15/2018   CALCIUM 9.2 06/20/2020   PHOS 3.0 11/11/2011   PROT 7.2 06/20/2020   ALBUMIN 4.2  06/07/2019   LABGLOB 2.4 01/15/2018   AGRATIO 1.9 01/15/2018   BILITOT 0.5 06/20/2020   ALKPHOS 88 06/07/2019   AST 29 06/20/2020   ALT 21 06/20/2020   ANIONGAP 10 07/15/2018   Last lipids Lab Results  Component Value Date   CHOL 163 06/20/2020   HDL 93 06/20/2020   LDLCALC 55 06/20/2020   TRIG 66 06/20/2020   CHOLHDL 1.8 06/20/2020   Last thyroid functions Lab Results  Component Value Date   TSH 1.05 06/20/2020   IMPRESSION AND PLAN:  #1 hypertension, well-controlled on HCTZ 25 mg a day, lisinopril 40 mg a day, and Lopressor 100 mg twice a day.  Lecture lites and creatinine today.  2.  Acute exacerbation of COPD/chronic bronchitis. Prednisone taper: 40 x 3, 30 x 3, 20 x 3, and 10 x 3. Azithromycin x 5 days. Continue albuterol every 6 hours  as needed and continue Trelegy daily.  Labs today: Fasting health panel and hemoglobin A1c.  An After Visit Summary was printed and given to the patient.  FOLLOW UP: Return in about 6 months (around 08/19/2022) for annual CPE (fasting).  Signed:  Crissie Sickles, MD           02/18/2022

## 2022-07-01 ENCOUNTER — Other Ambulatory Visit: Payer: Self-pay | Admitting: Family Medicine

## 2022-07-06 ENCOUNTER — Other Ambulatory Visit: Payer: Self-pay | Admitting: Family Medicine

## 2022-07-09 ENCOUNTER — Other Ambulatory Visit: Payer: Self-pay | Admitting: Family Medicine

## 2022-07-09 ENCOUNTER — Other Ambulatory Visit: Payer: Self-pay

## 2022-07-09 MED ORDER — METOPROLOL TARTRATE 100 MG PO TABS: 100.0000 mg | ORAL_TABLET | Freq: Two times a day (BID) | ORAL | 0 refills | Status: DC

## 2022-07-24 ENCOUNTER — Other Ambulatory Visit: Payer: Self-pay | Admitting: Family Medicine

## 2022-07-26 ENCOUNTER — Encounter: Payer: Self-pay | Admitting: Family Medicine

## 2022-08-20 ENCOUNTER — Encounter (INDEPENDENT_AMBULATORY_CARE_PROVIDER_SITE_OTHER): Payer: Self-pay

## 2022-08-27 ENCOUNTER — Other Ambulatory Visit: Payer: Self-pay | Admitting: Family Medicine

## 2022-09-03 ENCOUNTER — Other Ambulatory Visit: Payer: Self-pay | Admitting: Family Medicine

## 2022-09-05 ENCOUNTER — Encounter: Payer: No Typology Code available for payment source | Admitting: Family Medicine

## 2022-09-08 ENCOUNTER — Encounter: Payer: Self-pay | Admitting: Family Medicine

## 2022-09-08 ENCOUNTER — Other Ambulatory Visit: Payer: Self-pay

## 2022-09-08 ENCOUNTER — Other Ambulatory Visit: Payer: Self-pay | Admitting: Family Medicine

## 2022-09-08 MED ORDER — HYDROCHLOROTHIAZIDE 25 MG PO TABS: 25.0000 mg | ORAL_TABLET | Freq: Every day | ORAL | 1 refills | Status: DC

## 2022-09-08 MED ORDER — VENLAFAXINE HCL ER 75 MG PO CP24: 75.0000 mg | ORAL_CAPSULE | Freq: Every day | ORAL | 1 refills | Status: DC

## 2022-09-08 MED ORDER — METOPROLOL TARTRATE 100 MG PO TABS: 100.0000 mg | ORAL_TABLET | Freq: Two times a day (BID) | ORAL | 1 refills | Status: DC

## 2022-10-07 ENCOUNTER — Other Ambulatory Visit: Payer: Self-pay | Admitting: Family Medicine

## 2022-10-21 NOTE — Patient Instructions (Incomplete)

## 2022-10-23 ENCOUNTER — Encounter: Payer: Self-pay | Admitting: Family Medicine

## 2022-10-23 ENCOUNTER — Ambulatory Visit (INDEPENDENT_AMBULATORY_CARE_PROVIDER_SITE_OTHER): Payer: No Typology Code available for payment source | Admitting: Family Medicine

## 2022-10-23 VITALS — BP 108/73 | HR 68 | Temp 97.4°F | Ht 64.75 in | Wt 204.2 lb

## 2022-10-23 DIAGNOSIS — I10 Essential (primary) hypertension: Secondary | ICD-10-CM | POA: Diagnosis not present

## 2022-10-23 DIAGNOSIS — Z0000 Encounter for general adult medical examination without abnormal findings: Secondary | ICD-10-CM

## 2022-10-23 LAB — CBC WITH DIFFERENTIAL/PLATELET
Basophils Absolute: 0.1 10*3/uL (ref 0.0–0.1)
Basophils Relative: 0.8 % (ref 0.0–3.0)
Eosinophils Absolute: 0.1 10*3/uL (ref 0.0–0.7)
Eosinophils Relative: 1.9 % (ref 0.0–5.0)
HCT: 42.4 % (ref 36.0–46.0)
Hemoglobin: 14 g/dL (ref 12.0–15.0)
Lymphocytes Relative: 26.1 % (ref 12.0–46.0)
Lymphs Abs: 1.8 10*3/uL (ref 0.7–4.0)
MCHC: 33 g/dL (ref 30.0–36.0)
MCV: 91 fL (ref 78.0–100.0)
Monocytes Absolute: 0.4 10*3/uL (ref 0.1–1.0)
Monocytes Relative: 6.4 % (ref 3.0–12.0)
Neutro Abs: 4.4 10*3/uL (ref 1.4–7.7)
Neutrophils Relative %: 64.8 % (ref 43.0–77.0)
Platelets: 381 10*3/uL (ref 150.0–400.0)
RBC: 4.66 Mil/uL (ref 3.87–5.11)
RDW: 13.1 % (ref 11.5–15.5)
WBC: 6.8 10*3/uL (ref 4.0–10.5)

## 2022-10-23 LAB — COMPREHENSIVE METABOLIC PANEL
ALT: 19 U/L (ref 0–35)
AST: 21 U/L (ref 0–37)
Albumin: 4.1 g/dL (ref 3.5–5.2)
Alkaline Phosphatase: 90 U/L (ref 39–117)
BUN: 10 mg/dL (ref 6–23)
CO2: 29 meq/L (ref 19–32)
Calcium: 9.4 mg/dL (ref 8.4–10.5)
Chloride: 97 meq/L (ref 96–112)
Creatinine, Ser: 0.76 mg/dL (ref 0.40–1.20)
GFR: 86.43 mL/min (ref >60.00)
Glucose, Bld: 102 mg/dL — ABNORMAL HIGH (ref 70–99)
Potassium: 4.3 meq/L (ref 3.5–5.1)
Sodium: 133 meq/L — ABNORMAL LOW (ref 135–145)
Total Bilirubin: 0.4 mg/dL (ref 0.2–1.2)
Total Protein: 7.2 g/dL (ref 6.0–8.3)

## 2022-10-23 LAB — LIPID PANEL
Cholesterol: 150 mg/dL (ref 0–200)
HDL: 71.3 mg/dL (ref >39.00)
LDL Cholesterol: 64 mg/dL (ref 0–99)
NonHDL: 78.93
Total CHOL/HDL Ratio: 2
Triglycerides: 75 mg/dL (ref 0.0–149.0)
VLDL: 15 mg/dL (ref 0.0–40.0)

## 2022-10-23 LAB — TSH: TSH: 1.67 u[IU]/mL (ref 0.35–5.50)

## 2022-10-23 MED ORDER — VENLAFAXINE HCL ER 75 MG PO CP24: 75.0000 mg | ORAL_CAPSULE | Freq: Every day | ORAL | 1 refills | Status: DC

## 2022-10-23 MED ORDER — HYDROCHLOROTHIAZIDE 25 MG PO TABS: 25.0000 mg | ORAL_TABLET | Freq: Every day | ORAL | 1 refills | Status: DC

## 2022-10-23 MED ORDER — METOPROLOL TARTRATE 100 MG PO TABS: 100.0000 mg | ORAL_TABLET | Freq: Two times a day (BID) | ORAL | 1 refills | Status: DC

## 2022-10-23 MED ORDER — LISINOPRIL 40 MG PO TABS: ORAL_TABLET | ORAL | 1 refills | Status: DC

## 2022-10-23 NOTE — Progress Notes (Signed)
Office Note 10/23/2022  CC:  Chief Complaint  Patient presents with   Annual Exam    Pt is fasting   HPI:  Patient is a 57 y.o. female who is here for annual health maintenance exam and f/u HTN. Kristin Day feels like she is doing well.   Past Medical History:  Diagnosis Date   Anxiety and depression 11/11/2011   Started counseling 12/2015, counselor recommended effexor so I started it 01/02/16.   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  However, unclear etiology.  ENT considering dx of SSNHL or otosclerosis.  ENT rechecking 2 wks as of 08/03/17 office note.   Hypertension    Menometrorrhagia 11/11/2011   Multiple pulmonary nodules 11/20/2020   Lung ca screening CT-->LungRADS 4B, "suspicious"-> referral to Dr. Tonia Brooms.  PET 12/12/20 showed low uptake in the nodule. Slight imp on CT 05/2021, plan per pulm is rpt CT 6 mo.   Overweight(278.02) 11/11/2011   Lipids excellent 10/2012   Palpitations 05/2020   Perimenopausal disorder 2013   Sigmoid diverticulosis    on colonoscopy 02/2017   Tobacco dependence in remission    quit 2022    Past Surgical History:  Procedure Laterality Date   CESAREAN SECTION  1991   COLONOSCOPY W/ POLYPECTOMY  03/06/2017   adenomatous polyp: recall 5 yrs    Family History  Problem Relation Age of Onset   COPD Mother        smoker   Emphysema Mother    Hypertension Father    Cancer Father        skin/BCC and prostate   Colon polyps Father    Cancer Maternal Grandmother        ovarian   Emphysema Maternal Grandfather        ?   Heart disease Paternal Grandmother    Heart disease Paternal Grandfather    Hypertension Paternal Grandfather    Diabetes Paternal Grandfather    Colon cancer Neg Hx    Rectal cancer Neg Hx    Stomach cancer Neg Hx    Esophageal cancer Neg Hx     Social History   Socioeconomic History   Marital status: Married    Spouse name: Not on file   Number of children: Not on file   Years  of education: Not on file   Highest education level: Not on file  Occupational History   Not on file  Tobacco Use   Smoking status: Former    Current packs/day: 0.00    Average packs/day: 1 pack/day for 40.0 years (40.0 ttl pk-yrs)    Types: Cigarettes    Start date: 10/1980    Quit date: 10/2020    Years since quitting: 2.0   Smokeless tobacco: Never  Vaping Use   Vaping status: Never Used  Substance and Sexual Activity   Alcohol use: Yes    Comment: 6-12 beers weekly   Drug use: No   Sexual activity: Yes    Partners: Male  Other Topics Concern   Not on file  Social History Narrative   Married.   Has one adult son.   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   +Smoker, quit 2022/2023.   +Alcohol 2 beers a day, more on weekends as of 08/2012.   No drugs.         Social Determinants of Health   Financial Resource Strain: Not  on file  Food Insecurity: Not on file  Transportation Needs: Not on file  Physical Activity: Not on file  Stress: Not on file  Social Connections: Not on file  Intimate Partner Violence: Not on file    Outpatient Medications Prior to Visit  Medication Sig Dispense Refill   albuterol (VENTOLIN HFA) 108 (90 Base) MCG/ACT inhaler INHALE 2 PUFFS INTO THE LUNGS EVERY 4 HOURS AS NEEDED FOR WHEEZE OR FOR SHORTNESS OF BREATH 8.5 each 2   Fluticasone-Umeclidin-Vilant (TRELEGY ELLIPTA) 100-62.5-25 MCG/ACT AEPB Inhale 1 puff into the lungs daily.     hydrochlorothiazide (HYDRODIURIL) 25 MG tablet Take 1 tablet (25 mg total) by mouth daily. 30 tablet 1   lisinopril (ZESTRIL) 40 MG tablet TAKE 1 TABLET BY MOUTH EVERY DAY 90 tablet 0   metoprolol tartrate (LOPRESSOR) 100 MG tablet Take 1 tablet (100 mg total) by mouth 2 (two) times daily. 30 tablet 1   venlafaxine XR (EFFEXOR-XR) 75 MG 24 hr capsule Take 1 capsule (75 mg total) by mouth daily with breakfast. 30 capsule 1   No  facility-administered medications prior to visit.    Allergies  Allergen Reactions   Tamiflu [Oseltamivir] Nausea Only    Review of Systems  Constitutional:  Negative for appetite change, chills, fatigue and fever.  HENT:  Negative for congestion, dental problem, ear pain and sore throat.   Eyes:  Negative for discharge, redness and visual disturbance.  Respiratory:  Negative for cough, chest tightness, shortness of breath and wheezing.   Cardiovascular:  Negative for chest pain, palpitations and leg swelling.  Gastrointestinal:  Negative for abdominal pain, blood in stool, diarrhea, nausea and vomiting.  Genitourinary:  Negative for difficulty urinating, dysuria, flank pain, frequency, hematuria and urgency.  Musculoskeletal:  Negative for arthralgias, back pain, joint swelling, myalgias and neck stiffness.  Skin:  Negative for pallor and rash.  Neurological:  Negative for dizziness, speech difficulty, weakness and headaches.  Hematological:  Negative for adenopathy. Does not bruise/bleed easily.  Psychiatric/Behavioral:  Negative for confusion and sleep disturbance. The patient is not nervous/anxious.     PE;    10/23/2022    8:37 AM 02/18/2022   10:54 AM 11/13/2021   11:08 AM  Vitals with BMI  Height 5' 4.75" 5' 3.5" 5' 3.5"  Weight 204 lbs 3 oz 204 lbs 200 lbs  BMI 34.23 35.57 34.87  Systolic 108 124 161  Diastolic 73 79 70  Pulse 68 61 75   Exam chaperoned by Harlene Salts, CMA  Gen: Alert, well appearing.  Patient is oriented to person, place, time, and situation. AFFECT: pleasant, lucid thought and speech. ENT: Ears: EACs clear, normal epithelium.  TMs with good light reflex and landmarks bilaterally.  Eyes: no injection, icteris, swelling, or exudate.  EOMI, PERRLA. Nose: no drainage or turbinate edema/swelling.  No injection or focal lesion.  Mouth: lips without lesion/swelling.  Oral mucosa pink and moist.  Dentition intact and without obvious caries or gingival  swelling.  Oropharynx without erythema, exudate, or swelling.  Neck: supple/nontender.  No LAD, mass, or TM.  Carotid pulses 2+ bilaterally, without bruits. CV: RRR, no m/r/g.   LUNGS: CTA bilat, nonlabored resps, good aeration in all lung fields. ABD: soft, NT, ND, BS normal.  No hepatospenomegaly or mass.  No bruits. EXT: no clubbing, cyanosis, or edema.  Musculoskeletal: no joint swelling, erythema, warmth, or tenderness.  ROM of all joints intact. Skin - no sores or suspicious lesions or rashes or color changes  Pertinent labs:  Lab Results  Component Value Date   TSH 1.38 02/18/2022   Lab Results  Component Value Date   WBC 8.5 02/18/2022   HGB 12.8 02/18/2022   HCT 37.5 02/18/2022   MCV 88.0 02/18/2022   PLT 333.0 02/18/2022   Lab Results  Component Value Date   CREATININE 0.65 02/18/2022   BUN 9 02/18/2022   NA 135 02/18/2022   K 4.8 02/18/2022   CL 98 02/18/2022   CO2 28 02/18/2022   Lab Results  Component Value Date   ALT 20 02/18/2022   AST 22 02/18/2022   ALKPHOS 87 02/18/2022   BILITOT 0.4 02/18/2022   Lab Results  Component Value Date   CHOL 147 02/18/2022   Lab Results  Component Value Date   HDL 68.10 02/18/2022   Lab Results  Component Value Date   LDLCALC 68 02/18/2022   Lab Results  Component Value Date   TRIG 57.0 02/18/2022   Lab Results  Component Value Date   CHOLHDL 2 02/18/2022   Lab Results  Component Value Date   HGBA1C 5.7 02/18/2022   ASSESSMENT AND PLAN:   #1 health maintenance exam: Reviewed age and gender appropriate health maintenance issues (prudent diet, regular exercise, health risks of tobacco and excessive alcohol, use of seatbelts, fire alarms in home, use of sunscreen).  Also reviewed age and gender appropriate health screening as well as vaccine recommendations. Vaccines: Prevnar 20->she deferred today.  Shingrix --deferred in the past-->declined today. Labs: fasting HP labs ordered  Cervical ca screening:  last pap 11/06/15--> plans as per GYN-->pt states 5 yr interval at this time-->she we will schedule with Dr. Billy Coast. Breast ca screening: last mammo 11/21/21-->normal.  Pt to arrange rpt next month. Colon ca screening: next colonoscopy due as of 2024-->she is scheduled with GI.  #2 hypertension, well-controlled on HCTZ 25 mg a day, lisinopril 40 mg a day, and Lopressor 100 mg twice a day. Electrolytes and creatinine monitoring today.  An After Visit Summary was printed and given to the patient.  FOLLOW UP:  No follow-ups on file.  Signed:  Santiago Bumpers, MD           10/23/2022

## 2022-10-24 ENCOUNTER — Telehealth (HOSPITAL_BASED_OUTPATIENT_CLINIC_OR_DEPARTMENT_OTHER): Payer: Self-pay

## 2022-10-27 ENCOUNTER — Ambulatory Visit (HOSPITAL_BASED_OUTPATIENT_CLINIC_OR_DEPARTMENT_OTHER): Payer: No Typology Code available for payment source

## 2022-10-30 ENCOUNTER — Encounter: Payer: Self-pay | Admitting: Pulmonary Disease

## 2022-10-30 DIAGNOSIS — I7090 Unspecified atherosclerosis: Secondary | ICD-10-CM

## 2022-11-03 ENCOUNTER — Telehealth: Payer: Self-pay

## 2022-11-03 NOTE — Telephone Encounter (Signed)
Spoke with patient regarding prior message   I know that I have viewed my results before you have had an opportunity to do so.  While I believe that is unfortunate, I cannot help myself if the opportunity is there.  It looks to me as though my 19mm nodule is now non-existent??  I did not think that nodules just went away?  If so, yay!  I am concerned regarding the moderate atherosclerosis and would like to know the next step to address this.  I would like a referral to [facility] at [facility] if cardiology is suggested.  I would also like to know if a CT Cardiac Scoring would be a good idea for me.  If so, I would like to schedule that at [facility] in [place].  Do you think there is any difference between the results I rec'd from [facility] vs [facility].  The reports are written so different and I want to be certain we are not missing something in my lungs.  I may need a visit for all these concerns.  Just let me know.  Thanks.   Patient stated she had her CT scan and [facility] . Advised patient someone from our office will contact her back to schedule a f/u

## 2022-11-03 NOTE — Telephone Encounter (Signed)
Patient would like results of CT scan. Patient phone number is [phone].

## 2022-11-10 ENCOUNTER — Encounter: Payer: Self-pay | Admitting: Family Medicine

## 2022-11-10 NOTE — Telephone Encounter (Signed)
Dr. Tonia Brooms,  Patient had initial LDCT on 11/19/2020 that resulted as a 4B. Patient had follow up nodule scan on 05/28/2021. From 10/2020 to 05/2021 the nodules decreased in size: 20.12mm to 18.63mm, 19.66mm to 17.79mm, and 8.78mm to 7.75mm. The [facility] scan performed on 10/29/2022 showed GGO in RUL and resulted as a LR1. I cannot see images in pacs. Please advise on final recs since it is an outside provider and did not come to our dashboard. Thanks.

## 2022-11-11 NOTE — Telephone Encounter (Signed)
Appt to discuss pls. Virtual ok.

## 2022-11-18 ENCOUNTER — Other Ambulatory Visit: Payer: Self-pay | Admitting: Pulmonary Disease

## 2022-11-19 NOTE — Telephone Encounter (Signed)
Called and left VM for PACS IT.

## 2022-11-20 ENCOUNTER — Ambulatory Visit: Payer: No Typology Code available for payment source | Admitting: Family Medicine

## 2022-11-20 VITALS — BP 114/74 | HR 71 | Wt 208.0 lb

## 2022-11-20 DIAGNOSIS — Z9189 Other specified personal risk factors, not elsewhere classified: Secondary | ICD-10-CM

## 2022-11-20 DIAGNOSIS — I2510 Atherosclerotic heart disease of native coronary artery without angina pectoris: Secondary | ICD-10-CM | POA: Diagnosis not present

## 2022-11-20 NOTE — Progress Notes (Signed)
OFFICE VISIT  11/20/2022  CC:  Chief Complaint  Patient presents with   Follow-up    Pt wanting to discuss her most recent imaging. Pt inquiring about CT cardiac calcium score. She has appt scheduled with Dr. Nicki Guadalajara, cardiology, 12/12    Patient is a 57 y.o. female who presents for follow-up CT scan, discuss coronary calcification.  INTERIM HX: She recently got a CT of her lungs as part of surveillance for history of pulmonary nodules. It was done at an outside facility ([facility]), images not able to be reviewed but the report indicated  Lung-RADS 1 (no nodules), but moderate coronary artery calcification and mild calcifications of the thoracic aorta without aneurysm also detected. Coronary artery calcifications are also noted on her chest CT scans 11/19/2020 and 05/28/2021.  She feels well. No history of chest pain.  Past Medical History:  Diagnosis Date   Anxiety and depression 11/11/2011   Started counseling 12/2015, counselor recommended effexor so I started it 01/02/16.   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  However, unclear etiology.  ENT considering dx of SSNHL or otosclerosis.  ENT rechecking 2 wks as of 08/03/17 office note.   Hypertension    Menometrorrhagia 11/11/2011   Multiple pulmonary nodules 11/20/2020   Lung ca screening CT-->LungRADS 4B, "suspicious"-> referral to Dr. Tonia Brooms.  PET 12/12/20 showed low uptake in the nodule. Slight imp on CT 05/2021, plan per pulm is rpt CT 6 mo.   Overweight(278.02) 11/11/2011   Lipids excellent 10/2012   Palpitations 05/2020   Perimenopausal disorder 2013   Sigmoid diverticulosis    on colonoscopy 02/2017   Tobacco dependence in remission    quit 2022    Past Surgical History:  Procedure Laterality Date   CESAREAN SECTION  1991   COLONOSCOPY W/ POLYPECTOMY  03/06/2017   adenomatous polyp: recall 5 yrs    Outpatient Medications Prior to Visit  Medication Sig Dispense Refill    albuterol (VENTOLIN HFA) 108 (90 Base) MCG/ACT inhaler INHALE 2 PUFFS INTO THE LUNGS EVERY 4 HOURS AS NEEDED FOR WHEEZE OR FOR SHORTNESS OF BREATH 8.5 each 2   Fluticasone-Umeclidin-Vilant (TRELEGY ELLIPTA) 100-62.5-25 MCG/ACT AEPB Inhale 1 puff into the lungs daily.     hydrochlorothiazide (HYDRODIURIL) 25 MG tablet Take 1 tablet (25 mg total) by mouth daily. 90 tablet 1   lisinopril (ZESTRIL) 40 MG tablet TAKE 1 TABLET BY MOUTH EVERY DAY 90 tablet 1   metoprolol tartrate (LOPRESSOR) 100 MG tablet Take 1 tablet (100 mg total) by mouth 2 (two) times daily. 180 tablet 1   venlafaxine XR (EFFEXOR-XR) 75 MG 24 hr capsule Take 1 capsule (75 mg total) by mouth daily with breakfast. 90 capsule 1   No facility-administered medications prior to visit.    Allergies  Allergen Reactions   Tamiflu [Oseltamivir] Nausea Only    Review of Systems As per HPI  PE:    11/20/2022    4:12 PM 10/23/2022    8:37 AM 02/18/2022   10:54 AM  Vitals with BMI  Height  5' 4.75" 5' 3.5"  Weight 208 lbs 204 lbs 3 oz 204 lbs  BMI 34.87 34.23 35.57  Systolic 114 108 161  Diastolic 74 73 79  Pulse 71 68 61     Physical Exam  Gen: Alert, well appearing.  Patient is oriented to person, place, time, and situation. No further exam today  LABS:  Last CBC Lab Results  Component Value Date  WBC 6.8 10/23/2022   HGB 14.0 10/23/2022   HCT 42.4 10/23/2022   MCV 91.0 10/23/2022   MCH 31.6 06/20/2020   RDW 13.1 10/23/2022   PLT 381.0 10/23/2022   Last metabolic panel Lab Results  Component Value Date   GLUCOSE 102 (H) 10/23/2022   NA 133 (L) 10/23/2022   K 4.3 10/23/2022   CL 97 10/23/2022   CO2 29 10/23/2022   BUN 10 10/23/2022   CREATININE 0.76 10/23/2022   GFR 86.43 10/23/2022   CALCIUM 9.4 10/23/2022   PHOS 3.0 11/11/2011   PROT 7.2 10/23/2022   ALBUMIN 4.1 10/23/2022   LABGLOB 2.4 01/15/2018   AGRATIO 1.9 01/15/2018   BILITOT 0.4 10/23/2022   ALKPHOS 90 10/23/2022   AST 21 10/23/2022    ALT 19 10/23/2022   ANIONGAP 10 07/15/2018   Last lipids Lab Results  Component Value Date   CHOL 150 10/23/2022   HDL 71.30 10/23/2022   LDLCALC 64 10/23/2022   TRIG 75.0 10/23/2022   CHOLHDL 2 10/23/2022   Last hemoglobin A1c Lab Results  Component Value Date   HGBA1C 5.7 02/18/2022   IMPRESSION AND PLAN:  Coronary artery calcifications noted on lung cancer screening CT. We will get coronary calcium score to better quantify. Lipid panel earlier this month excellent without statin--> total cholesterol 150, HDL 71, LDL 64, triglycerides 75. She is a former smoker, quit between 1 and 2 years ago. She has well-controlled hypertension.  No history of any glucose abnormalities.  She already has a consultation set with a cardiologist on 01/01/2023.  An After Visit Summary was printed and given to the patient.  FOLLOW UP: Return for as needed.  Signed:  Santiago Bumpers, MD           11/20/2022

## 2022-11-21 MED ORDER — TRELEGY ELLIPTA 100-62.5-25 MCG/ACT IN AEPB: 1.0000 | INHALATION_SPRAY | Freq: Every day | RESPIRATORY_TRACT | 11 refills | Status: DC

## 2022-11-24 NOTE — Telephone Encounter (Signed)
Called PACS IT again and spoke with rep. He advised I call [facility] to have images sent through [facility] to [facility]. I called to the [facility] with [facility] to have this done. The rep I spoke with on the phone confirmed this would be done. Will follow up to see if images are in PACS.

## 2022-12-01 ENCOUNTER — Ambulatory Visit (INDEPENDENT_AMBULATORY_CARE_PROVIDER_SITE_OTHER): Payer: Self-pay

## 2022-12-01 DIAGNOSIS — Z9189 Other specified personal risk factors, not elsewhere classified: Secondary | ICD-10-CM

## 2022-12-12 ENCOUNTER — Telehealth: Payer: Self-pay

## 2022-12-12 ENCOUNTER — Inpatient Hospital Stay
Admission: RE | Admit: 2022-12-12 | Discharge: 2022-12-12 | Disposition: A | Discharge status: Home / Self Care | Payer: Self-pay | Source: Ambulatory Visit | Attending: Acute Care | Admitting: Acute Care

## 2022-12-12 DIAGNOSIS — R911 Solitary pulmonary nodule: Secondary | ICD-10-CM

## 2022-12-12 NOTE — Telephone Encounter (Signed)
CT LDCT has been requested from [facility] via power share.

## 2022-12-17 NOTE — Telephone Encounter (Signed)
Images have been uploaded to pacs.

## 2022-12-17 NOTE — Telephone Encounter (Signed)
CT scan has been uploaded to [facility]. It is under the 12/12/2022 date.

## 2022-12-19 ENCOUNTER — Encounter: Payer: Self-pay | Admitting: Family Medicine

## 2023-01-01 ENCOUNTER — Ambulatory Visit: Payer: No Typology Code available for payment source | Admitting: Cardiovascular Disease

## 2023-01-30 NOTE — Progress Notes (Signed)
 Cardiology Office Note:  .   Date:  02/04/2023 ID:  Oral Billings, DOB 10/26/1965, MRN [id number] PCP: Shelvia Dick, MD [facility] Providers Cardiologist:  None   Patient Profile: .      PMH Coronary artery disease CT calcium score 12/01/2022 CAC score 393 (98th percentile) LM 0, LAD 393, LCx 0, RCA 0 Aortic atherosclerosis Hypertension Former tobacco dependence Quit smoking 10/2020 Started back smoking July 2024 Pulmonary nodules COPD       History of Present Illness: .   Kristin DEFINO is a very pleasant 58 y.o. female  who is here today for new patient consult for elevated coronary artery calcium score. She reports a history of SOB that led to lung cancer screening CTs on a regular basis and the identification of coronary calcification. She requested CT calcium score for further risk stratification. She reports a history of a large pulmonary nodule that has now essentially disappeared. She sees pulmonology. She continues to have some SOB, especially during the summer. It has improved from a few years ago. Unfortunately, she quit smoking in October 2022 but started back July 2024.  Admits to 50 lb weight gain since she stopping smoking about two years ago. Her diet includes fast food for lunch and a balanced meal at home for dinner. Drinks diet soda regularly. She is not regularly active but is a little more active since her 72-year-old granddaughter has moved in. 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 Most recent lipid panel 10/23/2022 with total cholesterol 150, triglycerides 75, LDL 64, HDL 71.3. Reports her cholesterol has always been well controlled.  Notes palpitations at times.  She has been taking Lopressor  100 mg in the mornings only, skipping her nighttime dose. She denies chest pain, orthopnea, PND, edema, presyncope, syncope. She admits to difficulty when she first lays down at  night due to excess phlegm but she only sleeps on one pillow and has not had to adjust her sleeping position.  Her mother died from complications of COPD, no history of heart disease to her awareness.  Her father has atrial fibrillation and is still living at age 59.  She is an only child.  Family history: Her family history includes COPD in her mother; Cancer in her father and maternal grandmother; Colon polyps in her father; Diabetes in her paternal grandfather; Emphysema in her maternal grandfather and mother; Heart disease in her paternal grandfather and paternal grandmother; Hypertension in her father and paternal grandfather.  Mother died at age 31, no history of heart disease, exact cause of death unknown Father has a fib - still living age 62  ASCVD Risk Score: The 10-year ASCVD risk score (Arnett DK, et al., 2019) is: 4.8%   Values used to calculate the score:     Age: 2 years     Sex: Female     Is Non-Hispanic African American: No     Diabetic: No     Tobacco smoker: Yes     Systolic Blood Pressure: 124 mmHg     Is BP treated: Yes     HDL Cholesterol: 71.3 mg/dL     Total Cholesterol: 150 mg/dL    Diet: Cooks often Balanced meals with lean meats, vegetables, fruits, frequent grilling Pasta twice weekly Breakfast is frequently a banana and peanut butter Lunch is frequently fast food  Activity: Stays active with her 70 y/o granddaughter who lives  with her No regular exercise   ROS: See HPI       Studies Reviewed: Aaron Aas   EKG Interpretation Date/Time:  Wednesday February 04 2023 10:54:52 EST Ventricular Rate:  60 PR Interval:  124 QRS Duration:  84 QT Interval:  408 QTC Calculation: 408 R Axis:   64  Text Interpretation: Normal sinus rhythm Normal ECG When compared with ECG of 15-Jul-2018 10:03, PREVIOUS ECG IS PRESENT No ST abnormality Confirmed by Slater Duncan [phone]) on 02/04/2023 10:58:51 AM      Risk Assessment/Calculations:             Physical Exam:    VS: BP 124/72   Pulse 60   Ht 5' 3.5" (1.613 m)   Wt 205 lb 9.6 oz (93.3 kg)   LMP 04/20/2013   SpO2 97%   BMI 35.85 kg/m   Wt Readings from Last 3 Encounters:  02/04/23 205 lb 9.6 oz (93.3 kg)  11/20/22 208 lb (94.3 kg)  10/23/22 204 lb 3.2 oz (92.6 kg)     GEN: Obese, well developed in no acute distress NECK: No JVD; No carotid bruits CARDIAC: RRR, no murmurs, rubs, gallops RESPIRATORY:  Inspiratory wheezing bilaterally; no rales, crackles or rhonchi  ABDOMEN: Soft, non-tender, non-distended EXTREMITIES:  No edema; No deformity     ASSESSMENT AND PLAN: .    CAD: She has an elevated coronary calcium score with the bulk of calcium being noted in the LAD. She has a long standing history of shortness of breath, usually feels better in the winter.  She denies chest pain. EKG today does not reveal any ST abnormality. We had a lengthy discussion about further evaluation of ischemia and through shared decision making and due to additional risk factor of hypertension, longtime tobacco use, and COPD, we plan to get coronary CTA to evaluate for ischemia.  Will have her take Lopressor  100 mg 2 hours prior to test.  I have recommended that she change Lopressor  to 50 mg twice daily and monitor BP on this dose. Lipids have been well controlled. Consider starting aspirin 81 mg daily. Lengthy discussion about lifestyle modification including dietary changes to incorporate more high-fiber and high-protein foods and reduce intake of refined grains and soda.  Encouraged at least 150 minutes of moderate intensity exercise each week along with strengthening exercises such as squats and potential use of an app to track calories like My Fitness Pal. Continue metoprolol .  Hypertension: BP is well controlled. She has been taking Lopressor  100 mg in the mornings only, not taking nighttime dose.  I have advised her to change to Lopressor  50 mg twice daily and monitor BP for goal < 130/80. Notify us  with concerns.    CV Risk Assessment: ASCVD risk score is 4.8%.  She does have history of hypertension and long time tobacco abuse but no history of hyperlipidemia or diabetes. We will get coronary CTA for evaluation of ischemia. No evidence to start statin therapy at this time.   Tobacco abuse: She quit in 2022 but unfortunately started back 6 months ago. Complete cessation advised.   Plan/Goals: Plan/Goals: 1: Plan to walk for at least 30 minutes a day at least 5 days a week 2: Decrease intake of refined grains and potatoes 3: Decrease intake of soda        Disposition: 2-3 months with me  Signed, Slater Duncan, NP-C

## 2023-02-04 ENCOUNTER — Encounter (HOSPITAL_BASED_OUTPATIENT_CLINIC_OR_DEPARTMENT_OTHER): Payer: Self-pay | Admitting: Nurse Practitioner

## 2023-02-04 ENCOUNTER — Ambulatory Visit (HOSPITAL_BASED_OUTPATIENT_CLINIC_OR_DEPARTMENT_OTHER): Payer: No Typology Code available for payment source | Admitting: Nurse Practitioner

## 2023-02-04 VITALS — BP 124/72 | HR 60 | Ht 63.5 in | Wt 205.6 lb

## 2023-02-04 DIAGNOSIS — R931 Abnormal findings on diagnostic imaging of heart and coronary circulation: Secondary | ICD-10-CM | POA: Diagnosis not present

## 2023-02-04 DIAGNOSIS — I2510 Atherosclerotic heart disease of native coronary artery without angina pectoris: Secondary | ICD-10-CM | POA: Diagnosis not present

## 2023-02-04 DIAGNOSIS — I10 Essential (primary) hypertension: Secondary | ICD-10-CM

## 2023-02-04 DIAGNOSIS — Z720 Tobacco use: Secondary | ICD-10-CM | POA: Diagnosis not present

## 2023-02-04 DIAGNOSIS — Z7189 Other specified counseling: Secondary | ICD-10-CM

## 2023-02-04 MED ORDER — METOPROLOL TARTRATE 50 MG PO TABS: 50.0000 mg | ORAL_TABLET | Freq: Two times a day (BID) | ORAL | 3 refills | Status: DC

## 2023-02-04 NOTE — Patient Instructions (Addendum)
 Medication Instructions:   DECREASE Lopressor  one (1) tablet by mouth ( 50 mg ) twice daily.   *If you need a refill on your cardiac medications before your next appointment, please call your pharmacy*   Lab Work:  None ordered.   If you have labs (blood work) drawn today and your tests are completely normal, you will receive your results only by: MyChart Message (if you have MyChart) OR A paper copy in the mail If you have any lab test that is abnormal or we need to change your treatment, we will call you to review the results.   Testing/Procedures:    Your cardiac CT will be scheduled at one of the below locations:   [facility] [address] [place], [place] [address] [phone]  [facility] [address] [place], [place] [address] [phone]  If scheduled at [facility], please arrive at the [facility] Entrance (Entrance C2) of [facility] 30 minutes prior to test start time. You can use the FREE valet parking offered at entrance C (encouraged to control the heart rate for the test)  Proceed to the [facility] Radiology Department (first floor) to check-in and test prep.  All radiology patients and guests should use entrance C2 at [facility], accessed from [facility], even though the hospital's physical address listed is [address]    Please follow these instructions carefully (unless otherwise directed):  An IV will be required for this test and Nitroglycerin  will be given.   On the Night Before the Test: Be sure to Drink plenty of water. Do not consume any caffeinated/decaffeinated beverages or chocolate 12 hours prior to your test. Do not take any antihistamines 12 hours prior to your test.  On the Day of the Test: Drink plenty of water until 1 hour prior to the test. Do not eat any food 1 hour prior to test. You may take your regular medications prior to the  test.  Take metoprolol  (Lopressor ) two (2) tablets by mouth two hours prior to test.  If you take Hydrochlorothiazide  please HOLD on the morning of the test.  FEMALES- please wear underwire-free bra if available, avoid dresses & tight clothing   After the Test: Drink plenty of water. After receiving IV contrast, you may experience a mild flushed feeling. This is normal. On occasion, you may experience a mild rash up to 24 hours after the test. This is not dangerous. If this occurs, you can take Benadryl 25 mg and increase your fluid intake. If you experience trouble breathing, this can be serious. If it is severe call 911 IMMEDIATELY. If it is mild, please call our office.  We will call to schedule your test 2-4 weeks out understanding that some insurance companies will need an authorization prior to the service being performed.   For more information and frequently asked questions, please visit our website : [email or web address]  For non-scheduling related questions, please contact the cardiac imaging nurse navigator should you have any questions/concerns: Cardiac Imaging Nurse Navigators Direct Office Dial: [phone]   For scheduling needs, including cancellations and rescheduling, please call [place], [phone].    Follow-Up: At [facility], you and your health needs are our priority.  As part of our continuing mission to provide you with exceptional heart care, we have created designated Provider Care Teams.  These Care Teams include your primary Cardiologist (physician) and Advanced Practice Providers (APPs -  Physician  Assistants and Nurse Practitioners) who all work together to provide you with the care you need, when you need it.  We recommend signing up for the patient portal called "MyChart".  Sign up information is provided on this After Visit Summary.  MyChart is used to connect with patients for Virtual Visits (Telemedicine).  Patients are able  to view lab/test results, encounter notes, upcoming appointments, etc.  Non-urgent messages can be sent to your provider as well.   To learn more about what you can do with MyChart, go to [email or web address].    Your next appointment:   3 month(s)  Provider:   Slater Duncan, NP    Other Instructions  Please My Fitness Pal APP to track calories.   Adopting a Healthy Lifestyle.   Weight: Know what a healthy weight is for you (roughly BMI <25) and aim to maintain this. You can calculate your body mass index on your smart phone. Unfortunately, this is not the most accurate measure of healthy weight, but it is the simplest measurement to use. A more accurate measurement involves body scanning which measures lean muscle, fat tissue and bony density. We do not have this equipment at [facility].    Diet: Aim for 7+ servings of fruits and vegetables daily Limit animal fats in diet for cholesterol and heart health - choose grass fed whenever available Avoid highly processed foods (fast food burgers, tacos, fried chicken, pizza, hot dogs, french fries)  Saturated fat comes in the form of butter, lard, coconut oil, margarine, partially hydrogenated oils, and fat in meat. These increase your risk of cardiovascular disease.  Use healthy plant oils, such as olive, canola, soy, corn, sunflower and peanut.  Whole foods such as fruits, vegetables and whole grains have fiber  Men need > 38 grams of fiber per day Women need > 25 grams of fiber per day  Load up on vegetables and fruits - one-half of your plate: Aim for color and variety, and remember that potatoes dont count. Go for whole grains - one-quarter of your plate: Whole wheat, barley, wheat berries, quinoa, oats, brown rice, and foods made with them. If you want pasta, go with whole wheat pasta. Protein power - one-quarter of your plate: Fish, chicken, beans, and nuts are all healthy, versatile protein sources. Limit red meat. You need  carbohydrates for energy! The type of carbohydrate is more important than the amount. Choose carbohydrates such as vegetables, fruits, whole grains, beans, and nuts in the place of white rice, white pasta, potatoes (baked or fried), macaroni and cheese, cakes, cookies, and donuts.  If youre thirsty, drink water. Coffee and tea are good in moderation, but skip sugary drinks and limit milk and dairy products to one or two daily servings. Keep sugar intake at 6 teaspoons or 24 grams or LESS       Exercise: Aim for 150 min of moderate intensity exercise weekly for heart health, and weights twice weekly for bone health Stay active - any steps are better than no steps! Aim for 7-9 hours of sleep daily      Tackling Obesity with Lifestyle Changes  Obesity- What is it? And What can we do about it?  Obesity is a chronic complex disease defined as excessive fat deposits that can have a negative effect on our health. It can lead to many other diseases including type 2 diabetes.  Weight gain occurs when the amount of energy (calories) we consume is greater than the amount we use.  When  our energy output is greater than our energy input we lose weight. The basic concept is simple, but in reality, it's much more complicated.  Unfortunately, in some people, our bodies have many ways it can compensate when we try to eat less and move more which can prevent us  from changing our weight. This can lead to some people having a much more difficult time losing weight even when they put healthy habits into practice. This can be frustrating. We want to focus on healthy habits, physical activity and how we feel, and less the number on the scale.  Food As Energy  Calories  Calories is just a unit of measurement for energy.  Counting calories is not required to lose weight but counting for a short period of time can:   help you learn good portion sizes   Learn what your true energy needs are.   Help you  be more aware of your snacking or grazing habits  To help calculate how many calories you should be eating, the NIH has a great body weight planner calculator at BeverageBuggy.si  Types of Energy Expenditure  Basal Metabolic Rate (BMR) Energy that our bodies use to preform everyday tasks. More muscle mass through resistance training can increase this a small amount  Thermic Effect of Food The amount of energy that it takes to breakdown the food we eat. This will be highest when we eat protein and fiber rich foods  Exercise Energy Expenditure The amount of energy used during formal exercise (walking, biking, weightlifting)  Non-exercise activity thermogenesis (NEAT) The amount of energy spent on activities that are not formal exercise (standing, fidgeting). Therefore, it is not only important to do formal exercise but also move around throughout the day.  Managing The Meal  Macro nutrients (carbohydrates, fats and protein, fiber, water)  Micronutrients (vitamins, minerals)  Dietary Fiber  Benefits Examples Cautions  Soluble fiber  Decreases cholesterol  improve blood sugar control,  Feeds our gut bacteria  Allows us  to feel fuller for longer so we eat less  fruits  oats  barley  legumes  peas  Beans  vegetables (broccoli) and root vegetables (carrots) Add fiber into your diet slowly and be sure to drink at least 8 cups of water a day. This will help limit gas, bloating, diarrhea, or constipation.  Insoluble Fiber  Improves digestive health by making stool easier to pass  Allows us  to feel fuller for longer so we eat less  whole grains  nuts  seeds  skin of fruit  vegetables (green beans, zucchini, cauliflower)  Tricks to add more fiber to your diet   Add beans (pinto, kidney, lima, navy and garbanzo) to salads, ground meat or brown rice   Add nuts or seeds and or fresh/frozen fruit to yogurt, cottage cheese, salads or steel cut oats   Cut up vegetables and  eat with hummus   Look for unsweetened whole grain cereals with at least 5g of fiber per serving   Switch to whole grain bread. Look for bread that has whole grain flour as the first ingredient and has more fiber than carbs if you were to multiple the fiber x 10.   Try bulgar, barely, quinoa, buckwheat, brown rice wild rice instead of white rice   Keep frozen vegetables on hand to add to dishes or soups  Meal Planning:  Meal planning is the key to setting you up for success. Here are some examples of healthy meal options.  Breakfast  Option  1: Omelette with vegetables (1 egg, spinach, mushrooms, or other vegetable of your choice), 2 slices whole-grain toast, tip of thumb size butter or soft margarine,  cup low-fat milk or yogurt  Option 2: steel-cut rolled oats (? cup dry), 1 tbsp peanut butter added to cooked oats,  cup low-fat milk.  Option 3: 2 slices whole-grain or rye toast with avocado spread ( small avocado mased with herbs and pepper to taste), 1 poached egg or [place] up (cooked to your liking)  Option 4:  cup plain 0% [place] yogurt topped with  cup berries and  cup walnuts or almonds, 2 slices whole-grain or rye toast, tip of thumb size soft margarine/butter  Lunch:  Option 1: 2 cups red lentil soup, green salad with 1 tbsp homemade vinaigrette (extra virgin olive oil and vinegar of choice plus spices)  Option 2: 3 oz. roasted chicken, 2 slices whole-grain bread, 2 tsp mayonnaise, mustard, lettuce, tomato if desired, 1 fruit (example: medium-sized apple or small pear)  Option 3: 3 oz. tuna packed in water, 1 whole-wheat pita (6 inch), 2 tsp mayonnaise, lettuce, tomato, or other non-starchy vegetable of your choice, 1 fruit (example: medium-sized apple or small pear)  Option 4: 1 serving of garden veggie buddha bowl with lentils and tahini sauce and 1 cup berries topped with  cup plain 0% Greek yogurt  Dinner:  Option 1: 1 serving roasted cauliflower salad,  3-4 oz. grilled or baked pork loin chop, 1/2 cup mashed potato, or brown rice or quinoa  Option 2: 1 serving fish (baked, grilled or air fried), green salad, 1 tbsp homemade vinaigrette,  cup cooked couscous  Option 3: 1 cup cooked whole grained pasta (example: spaghetti, spirals, macaroni),  cup favorite pasta sauce (preferably homemade), 3-4 oz. grilled or baked chicken, green salad, 1 tbsp homemade vinaigrette  Option 4: 1 serving oven roasted salmon,  cup mashed sweet potato or couscous or brown rice or quinoa, broccoli (steamed or roasted)  Healthy snacks:   Carrots or celery with 1 tbsp of hummus   1 medium-sized fruit (apple or orange)   1 cup plain 0% [place] yogurt with  cup berries   Half apple, sliced, with 1 tbsp (15 mL) peanut or almond butter  Dining out:  Eating away from home has become a part of many people's lifestyle. Making healthy choices when you are eating out is important too. Portion size is an important part of healthy choices. Most branded fast-food places provide calories, sodium, and fat content for their menu items. www.calorieking.com would be great resource to find nutrition facts for your favorite brands and fast-food restaurants. Company specific website can be Chief Technology Officer for nutrition information for their items. (e.g. www.mcdonalds.com or www.nutritionix.com/[place]/menu/premium)  Here are some tips to help you make wise food choices when you are dining out.  Chose more often Avoid  Beverages   Choose more often: Water, low fat milk  Sugar-free/diet drinks  Unsweet tea or coffee    Avoid: Milkshakes, fruit drinks, regular pop  Alcohol, specialty drinks (e.g. iced cappuccino)  Fast food  Choose more often:  Garden salad  Mini subs, pita sandwiches ect with extra vegetables  plain burgers, grilled chicken  Vegetarian or cheese pizza with whole-grain crust    Avoid: Burgers/sandwiches with bacon, cheese, and high-fat sauces   [place] fries, fried chicken, fried fish, poutine, hash browns  Pizza with processed meats  Starters   Choose more often: Raw vegetables, salads (garden, spinach, fruit)  clear or vegetable  soups  Seafood cocktail  Whole-grain breads and rolls    Avoid: Salads with high-fat dressings or toppings  Creamy soups  Wings, egg rolls  onion rings, nachos  White or garlic bread  Main courses Grains & Starches (amount equal to  of your plate)  Choose more often:  Oatmeal, high-fiber/lower-sugar cereals  Whole-grain breads, rice, pasta, barley, couscous  Sweet potatoes    Avoid: Sugary, low-fiber cereals  Large bagels, muffins, croissants, white bread  [place] fries, hash browns, fried rice   Meat and alternative (amount equal to  of your plate)  Choose more often:  Lean meats, poultry, fish, eggs, low-fat cheese  Tofu, vegetable protein Legumes (e.g. lentils, chickpeas, beans)    Avoid: High-salt and/or high-fat meats (e.g. ribs, wings, sausages, wieners, processed lunch meats, imposter meats)   Vegetables (amount equal to  of your plate)  Choose more often:  Salads ([place], garden, spinach), plain vegetables   Avoid:  Salads with creamy, high-fat dressings and   Vegetables on sandwiches ect toppings like bacon bits, croutons, cheese  Desserts  Choose more often:  Fresh fruit, frozen yogourt, skim milk latte    Avoid: Cakes, pies, pastries, ice cream, cheesecake       Mediterranean Diet  Why follow it? Research shows. Those who follow the Mediterranean diet have a reduced risk of heart disease  The diet is associated with a reduced incidence of Parkinson's and Alzheimer's diseases People following the diet may have longer life expectancies and lower rates of chronic diseases  The Dietary Guidelines for Americans recommends the Mediterranean diet as an eating plan to promote health and prevent disease  What Is the Mediterranean Diet?  Healthy eating plan based on  typical foods and recipes of Mediterranean-style cooking The diet is primarily a plant based diet; these foods should make up a majority of meals   Starches - Plant based foods should make up a majority of meals - They are an important sources of vitamins, minerals, energy, antioxidants, and fiber - Choose whole grains, foods high in fiber and minimally processed items  - Typical grain sources include wheat, oats, barley, corn, brown rice, bulgar, farro, millet, polenta, couscous  - Various types of beans include chickpeas, lentils, fava beans, black beans, white beans   Fruits  Veggies - Large quantities of antioxidant rich fruits & veggies; 6 or more servings  - Vegetables can be eaten raw or lightly drizzled with oil and cooked  - Vegetables common to the traditional Mediterranean Diet include: artichokes, arugula, beets, broccoli, brussel sprouts, cabbage, carrots, celery, collard greens, cucumbers, eggplant, kale, leeks, lemons, lettuce, mushrooms, okra, onions, peas, peppers, potatoes, pumpkin, radishes, rutabaga, shallots, spinach, sweet potatoes, turnips, zucchini - Fruits common to the Mediterranean Diet include: apples, apricots, avocados, cherries, clementines, dates, figs, grapefruits, grapes, melons, nectarines, oranges, peaches, pears, pomegranates, strawberries, tangerines  Fats - Replace butter and margarine with healthy oils, such as olive oil, canola oil, and tahini  - Limit nuts to no more than a handful a day  - Nuts include walnuts, almonds, pecans, pistachios, pine nuts  - Limit or avoid candied, honey roasted or heavily salted nuts - Olives are central to the [employer] - can be eaten whole or used in a variety of dishes   Meats Protein - Limiting red meat: no more than a few times a month - When eating red meat: choose lean cuts and keep the portion to the size of deck of cards - Eggs: approx. 0 to  4 times a week  - Fish and lean poultry: at least 2 a week  -  Healthy protein sources include, chicken, [place], lean beef, lamb - Increase intake of seafood such as tuna, salmon, trout, mackerel, shrimp, scallops - Avoid or limit high fat processed meats such as sausage and bacon  Dairy - Include moderate amounts of low fat dairy products  - Focus on healthy dairy such as fat free yogurt, skim milk, low or reduced fat cheese - Limit dairy products higher in fat such as whole or 2% milk, cheese, ice cream  Alcohol - Moderate amounts of red wine is ok  - No more than 5 oz daily for women (all ages) and men older than age 102  - No more than 10 oz of wine daily for men younger than 68  Other - Limit sweets and other desserts  - Use herbs and spices instead of salt to flavor foods  - Herbs and spices common to the traditional Mediterranean Diet include: basil, bay leaves, chives, cloves, cumin, fennel, garlic, lavender, marjoram, mint, oregano, parsley, pepper, rosemary, sage, savory, sumac, tarragon, thyme   It's not just a diet, it's a lifestyle:  The Mediterranean diet includes lifestyle factors typical of those in the region  Foods, drinks and meals are best eaten with others and savored Daily physical activity is important for overall good health This could be strenuous exercise like running and aerobics This could also be more leisurely activities such as walking, housework, yard-work, or taking the stairs Moderation is the key; a balanced and healthy diet accommodates most foods and drinks Consider portion sizes and frequency of consumption of certain foods   Meal Ideas & Options:  Breakfast:  Whole wheat toast or whole wheat English muffins with peanut butter & hard boiled egg Steel cut oats topped with apples & cinnamon and skim milk  Fresh fruit: banana, strawberries, melon, berries, peaches  Smoothies: strawberries, bananas, greek yogurt, peanut butter Low fat greek yogurt with blueberries and granola  Egg white omelet with spinach and  mushrooms Breakfast couscous: whole wheat couscous, apricots, skim milk, cranberries  Sandwiches:  Hummus and grilled vegetables (peppers, zucchini, squash) on whole wheat bread   Grilled chicken on whole wheat pita with lettuce, tomatoes, cucumbers or tzatziki  [place] salad on whole wheat bread: tuna salad made with greek yogurt, olives, red peppers, capers, green onions Garlic rosemary lamb pita: lamb sauted with garlic, rosemary, salt & pepper; add lettuce, cucumber, greek yogurt to pita - flavor with lemon juice and black pepper  Seafood:  Mediterranean grilled salmon, seasoned with garlic, basil, parsley, lemon juice and black pepper Shrimp, lemon, and spinach whole-grain pasta salad made with low fat greek yogurt  Seared scallops with lemon orzo  Seared tuna steaks seasoned salt, pepper, coriander topped with tomato mixture of olives, tomatoes, olive oil, minced garlic, parsley, green onions and cappers  Meats:  Herbed greek chicken salad with kalamata olives, cucumber, feta  Red bell peppers stuffed with spinach, bulgur, lean ground beef (or lentils) & topped with feta   Kebabs: skewers of chicken, tomatoes, onions, zucchini, squash  [place] burgers: made with red onions, mint, dill, lemon juice, feta cheese topped with roasted red peppers Vegetarian Cucumber salad: cucumbers, artichoke hearts, celery, red onion, feta cheese, tossed in olive oil & lemon juice  Hummus and whole grain pita points with a greek salad (lettuce, tomato, feta, olives, cucumbers, red onion) Lentil soup with celery, carrots made with vegetable broth, garlic, salt  and pepper  Tabouli salad: parsley, bulgur, mint, scallions, cucumbers, tomato, radishes, lemon juice, olive oil, salt and pepper.    Goals: 1: Plan to walk for at least 30 minutes a day at least 5 days a week 2: Decrease intake of refined grains and potatoes 3: Decrease intake of soda

## 2023-02-21 HISTORY — PX: CT CTA CORONARY W/CA SCORE W/CM &/OR WO/CM: HXRAD787

## 2023-02-23 ENCOUNTER — Encounter (HOSPITAL_BASED_OUTPATIENT_CLINIC_OR_DEPARTMENT_OTHER): Payer: Self-pay

## 2023-02-25 ENCOUNTER — Other Ambulatory Visit: Payer: Self-pay | Admitting: Family Medicine

## 2023-03-02 ENCOUNTER — Telehealth (HOSPITAL_COMMUNITY): Payer: Self-pay | Admitting: *Deleted

## 2023-03-02 NOTE — Telephone Encounter (Signed)
 Reaching out to patient to offer assistance regarding upcoming cardiac imaging study; pt verbalizes understanding of appt date/time, parking situation and where to check in, pre-test NPO status and medications ordered, and verified current allergies; name and call back number provided for further questions should they arise Johney Frame RN Navigator Cardiac Imaging [facility] [phone] office [phone] cell

## 2023-03-03 ENCOUNTER — Ambulatory Visit (HOSPITAL_BASED_OUTPATIENT_CLINIC_OR_DEPARTMENT_OTHER)
Admission: RE | Admit: 2023-03-03 | Discharge: 2023-03-03 | Disposition: A | Discharge status: Home / Self Care | Payer: No Typology Code available for payment source | Source: Ambulatory Visit | Attending: Nurse Practitioner | Admitting: Nurse Practitioner

## 2023-03-03 ENCOUNTER — Encounter (HOSPITAL_BASED_OUTPATIENT_CLINIC_OR_DEPARTMENT_OTHER): Payer: Self-pay

## 2023-03-03 DIAGNOSIS — R931 Abnormal findings on diagnostic imaging of heart and coronary circulation: Secondary | ICD-10-CM | POA: Insufficient documentation

## 2023-03-03 DIAGNOSIS — I2510 Atherosclerotic heart disease of native coronary artery without angina pectoris: Secondary | ICD-10-CM | POA: Insufficient documentation

## 2023-03-03 MED ORDER — IOHEXOL 350 MG/ML SOLN
100.0000 mL | Freq: Once | INTRAVENOUS | Status: AC | PRN
Start: 2023-03-03 — End: 2023-03-03
  Administered 2023-03-03: 95 mL via INTRAVENOUS

## 2023-03-03 MED ORDER — NITROGLYCERIN 0.4 MG SL SUBL
0.8000 mg | SUBLINGUAL_TABLET | Freq: Once | SUBLINGUAL | Status: AC
  Administered 2023-03-03: 0.8 mg via SUBLINGUAL

## 2023-03-03 NOTE — Progress Notes (Signed)
Patient presents for a cardiac CT and tolerated test without incident. Patient maintained acceptable vital signs, denies symptoms.   Patient ambulated out of department with a steady gait.

## 2023-04-15 NOTE — Progress Notes (Deleted)
 Cardiology Office Note:  .   Date:  04/15/2023 ID:  Kristin Day, DOB Nov 27, 1965, MRN [id number] PCP: Jeoffrey Massed, MD [facility] Providers Cardiologist:  None   Patient Profile: .      PMH Coronary artery disease CT calcium score 12/01/2022 CAC score 393 (98th percentile) LM 0, LAD 393, LCx 0, RCA 0 Aortic atherosclerosis Hypertension Former tobacco dependence Quit smoking 10/2020 Started back smoking July 2024 Pulmonary nodules COPD  Referred to cardiology and seen by me on 02/04/2023 as a new patient for elevated coronary artery calcium score. History of SOB that led to lung cancer screening CTs on a regular basis and the identification of coronary calcification. She requested CT calcium score for further risk stratification. History of a large pulmonary nodule that has now essentially disappeared. She sees pulmonology. She continues to have some SOB, especially during the summer. It has improved from a few years ago. Unfortunately, she quit smoking in October 2022 but started back July 2024.  Admits to 50 lb weight gain since she stopping smoking about two years ago. Her diet includes fast food for lunch and a balanced meal at home for dinner, usually a grilled meat and vegetables. Eats pasta twice weekly and drinks diet soda regularly. She is not regularly active but is a little more active since her 38-year-old granddaughter has moved in. 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 Most recent lipid panel 10/23/2022 with total cholesterol 150, triglycerides 75, LDL 64, HDL 71.3. Reports her cholesterol has always been well controlled. Notes palpitations at times.  She has been taking Lopressor 100 mg in the mornings only, skipping her nighttime dose. No chest pain, orthopnea, PND, edema, presyncope, syncope. Has difficulty when she first lays down at night due to excess phlegm but she only sleeps  on one pillow and has not had to adjust her sleeping position.  Her mother died from complications of COPD, no history of heart disease to her awareness.  Her father has atrial fibrillation and is still living at age 34.  She is an only child. ASCVD risk score is 4.6%. Due to concerning symptoms, she underwent coronary CTA 03/03/23 which revealed To evaluate for due to concerning symptoms, mild nonobstructive CAD (25 to 49%) in proximal and mid LAD, no significant obstruction. Stable mild anteromedial right middle lobe and posterior left basilar scarring and/or atelectasis and small hiatal hernia also noted.        History of Present Illness: .   Kristin Day is a very pleasant 58 y.o. female  who is here today for follow-up of CAD   ROS: See HPI       Studies Reviewed: .          Risk Assessment/Calculations:     No BP recorded.  {Refresh Note OR Click here to enter BP  :1}***       Physical Exam:   VS: LMP 04/20/2013   Wt Readings from Last 3 Encounters:  02/04/23 205 lb 9.6 oz (93.3 kg)  11/20/22 208 lb (94.3 kg)  10/23/22 204 lb 3.2 oz (92.6 kg)     GEN: Obese, well developed in no acute distress NECK: No JVD; No carotid bruits CARDIAC: RRR, no murmurs, rubs, gallops RESPIRATORY:  Inspiratory wheezing bilaterally; no rales, crackles or rhonchi  ABDOMEN: Soft, non-tender, non-distended EXTREMITIES:  No edema; No deformity     ASSESSMENT AND PLAN: .  CAD: Elevated coronary calcium score with the bulk of calcium being noted in the LAD with coronary CTA that revealed mild (25-49%) nonobstructive stenosis of proximal to mid LAD. Long standing history of shortness of breath, usually feels better in the winter.  *** Consider starting aspirin 81 mg daily. Lengthy discussion about lifestyle modification including dietary changes to incorporate more high-fiber and high-protein foods and reduce intake of refined grains and soda.  Encouraged at least 150 minutes of moderate intensity  exercise each week along with strengthening exercises such as squats and potential use of an app to track calories like My Fitness Pal. Continue metoprolol.  Hypertension: BP is well controlled. She has been taking Lopressor 100 mg in the mornings only, not taking nighttime dose.  I have advised her to change to Lopressor 50 mg twice daily and monitor BP for goal < 130/80. Notify us with concerns.   CV Risk Assessment: ASCVD risk score is 4.8%.  She does have history of hypertension and long time tobacco abuse but no history of hyperlipidemia or diabetes. We will get coronary CTA for evaluation of ischemia. No evidence to start statin therapy at this time.   Tobacco abuse: She quit in 2022 but unfortunately started back 6 months ago. Complete cessation advised.             Disposition:***  Signed, Eligha Bridegroom, NP-C

## 2023-04-22 ENCOUNTER — Ambulatory Visit (HOSPITAL_BASED_OUTPATIENT_CLINIC_OR_DEPARTMENT_OTHER): Payer: No Typology Code available for payment source | Admitting: Nurse Practitioner

## 2023-04-23 ENCOUNTER — Ambulatory Visit: Payer: No Typology Code available for payment source | Admitting: Family Medicine

## 2023-04-29 ENCOUNTER — Other Ambulatory Visit: Payer: Self-pay | Admitting: Family Medicine

## 2023-05-22 ENCOUNTER — Other Ambulatory Visit: Payer: Self-pay | Admitting: Family Medicine

## 2023-05-22 ENCOUNTER — Encounter: Payer: Self-pay | Admitting: Family Medicine

## 2023-06-10 ENCOUNTER — Other Ambulatory Visit: Payer: Self-pay | Admitting: Family Medicine

## 2023-06-25 ENCOUNTER — Other Ambulatory Visit: Payer: Self-pay | Admitting: Family Medicine

## 2023-06-25 ENCOUNTER — Ambulatory Visit: Admitting: Family Medicine

## 2023-07-13 ENCOUNTER — Other Ambulatory Visit: Payer: Self-pay | Admitting: Family Medicine

## 2023-07-16 ENCOUNTER — Ambulatory Visit (HOSPITAL_BASED_OUTPATIENT_CLINIC_OR_DEPARTMENT_OTHER): Admitting: Nurse Practitioner

## 2023-07-16 VITALS — BP 118/70 | HR 72 | Ht 63.5 in | Wt 212.8 lb

## 2023-07-16 DIAGNOSIS — I2510 Atherosclerotic heart disease of native coronary artery without angina pectoris: Secondary | ICD-10-CM

## 2023-07-16 DIAGNOSIS — I10 Essential (primary) hypertension: Secondary | ICD-10-CM | POA: Diagnosis not present

## 2023-07-16 DIAGNOSIS — R062 Wheezing: Secondary | ICD-10-CM

## 2023-07-16 DIAGNOSIS — Z720 Tobacco use: Secondary | ICD-10-CM

## 2023-07-16 DIAGNOSIS — R600 Localized edema: Secondary | ICD-10-CM

## 2023-07-16 DIAGNOSIS — R931 Abnormal findings on diagnostic imaging of heart and coronary circulation: Secondary | ICD-10-CM | POA: Diagnosis not present

## 2023-07-16 MED ORDER — ROSUVASTATIN CALCIUM 5 MG PO TABS: 5.0000 mg | ORAL_TABLET | Freq: Every day | ORAL | 3 refills | Status: DC

## 2023-07-16 MED ORDER — VARENICLINE TARTRATE 0.5 MG PO TABS: ORAL_TABLET | ORAL | 0 refills | Status: DC

## 2023-07-16 MED ORDER — ASPIRIN 81 MG PO TBEC: 81.0000 mg | DELAYED_RELEASE_TABLET | Freq: Every day | ORAL | Status: AC

## 2023-07-16 MED ORDER — VARENICLINE TARTRATE 1 MG PO TABS: ORAL_TABLET | ORAL | 0 refills | Status: DC

## 2023-07-16 NOTE — Progress Notes (Signed)
 Cardiology Office Note:  .   Date:  07/16/2023 ID:  Lonell GORMAN Cleverly, DOB 1965-04-05, MRN [id number] PCP: [name], MD [facility] Providers Cardiologist:  None   Patient Profile: .      PMH Coronary artery disease CT calcium  score 12/01/2022 CAC score 393 (98th percentile) LM 0, LAD 393, LCx 0, RCA 0 Coronary CTA 03/03/2023 Mild CAD 25-49% prox and mid LAD Severe TPV Aortic atherosclerosis Hypertension Former tobacco dependence Quit smoking 10/2020 Started back smoking July 2024 Pulmonary nodules COPD     Seen by me on 02/04/23 for new patient consult for elevated coronary artery calcium  score. She reports a history of SOB that led to lung cancer screening CTs on a regular basis and the identification of coronary calcification. She requested CT calcium  score for further risk stratification. She reports a history of a large pulmonary nodule that has now essentially disappeared. She sees pulmonology. She continues to have some SOB, especially during the summer. It has improved from a few years ago. Unfortunately, she quit smoking in October 2022 but started back July 2024.  Admits to 50 lb weight gain since she stopping smoking about two years ago. Her diet includes fast food for lunch and a balanced meal at home for dinner. Drinks diet soda regularly. She is not regularly active but is a little more active since her 44-year-old granddaughter has moved in. 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 Most recent lipid panel 10/23/2022 with total cholesterol 150, triglycerides 75, LDL 64, HDL 71.3. Reports her cholesterol has always been well controlled.  Notes palpitations at times.  She has been taking Lopressor  100 mg in the mornings only, skipping her nighttime dose. She denies chest pain, orthopnea, PND, edema, presyncope, syncope. She admits to difficulty when she first lays down at night due to  excess phlegm but she only sleeps on one pillow and has not had to adjust her sleeping position.  Her mother died from complications of COPD, no history of heart disease to her awareness.  Her father has atrial fibrillation and is still living at age 49.  She is an only child.  Coronary CTA 03/03/23 revealed mild non-obstructive CAD.     History of Present Illness: .   Kristin Day is a very pleasant 58 y.o. female  who is here today  Discussed the use of AI scribe software for clinical note transcription with the patient, who gave verbal consent to proceed.  History of Present Illness Kristin Day is a very pleasant 58 year old female who is here today for follow-up of  coronary artery disease.  She is concerned about bilateral ankle swelling and weight gain over the past 4 weeks. She thinks it may be associated with increased beer consumption during [place]. She noticed mild swelling prior to her trip, but swelling improved while at [facility], due to extensive walking. She works as a Armed forces training and education officer at [employer] and is sedentary for approximately 8 hours daily. She takes hydrochlorothiazide  daily. We discussed findings from coronary CTA.  Her LDL cholesterol was 64 in October, managed through lifestyle measures without medication. She takes metoprolol  50 mg twice daily and reports she is feeling better since we changed her from Lopressor  100 mg daily to 50 mg twice daily.  She is not currently exercising.  She denies chest pain,  shortness of breath, palpitations, orthopnea, PND.  No presyncope or syncope. Notes wheezing and increased shortness of breath in humid conditions.  ROS: See HPI       Studies Reviewed: .          Risk Assessment/Calculations:             Physical Exam:   VS: BP 118/70   Pulse 72   Ht 5' 3.5 (1.613 m)   Wt 212 lb 12.8 oz (96.5 kg)   LMP 04/20/2013   BMI 37.10 kg/m   Wt Readings from Last 3 Encounters:   07/16/23 212 lb 12.8 oz (96.5 kg)  02/04/23 205 lb 9.6 oz (93.3 kg)  11/20/22 208 lb (94.3 kg)     GEN: Obese, well developed in no acute distress NECK: No JVD; No carotid bruits CARDIAC: RRR, no murmurs, rubs, gallops RESPIRATORY:  Expiratory wheezing bilaterally; no rales, crackles or rhonchi  ABDOMEN: Soft, non-tender, non-distended EXTREMITIES:  No edema; No deformity     ASSESSMENT AND PLAN: .    Leg swelling: Bilateral leg edema that has worsened over the past 3 weeks. She admits to dietary indiscretion earlier in the month to celebrate her birthday.  She is currently on hydrochlorothiazide  25 mg daily.  We discussed adding Lasix as needed for edema, however she would like to continue to manage conservatively.  Demonstrated appropriate leg elevation and encouraged low-sodium diet. Continue hydrochlorothiazide .   CAD: Coronary CTA completed 03/03/23 due to calcification noted on prior CT which revealed CAC score of 363 (98 percentile), mild nonobstructive CAD 25 to 49% in proximal and mid LAD, severe to PV.  We discussed these findings in detail and questions were answered to her satisfaction.  She is not having any symptoms concerning for angina and no further ischemia evaluation is warranted at this time. Continue secondary prevention with good BP and cholesterol control, stop smoking, good glucose control, healthy diet, weight loss, and regular physical activity.  Lipids have been well controlled, however I have recommended rosuvastatin  5 mg daily or at least 3 days a week for plaque stabilization.  She plans to start aspirin  81 mg daily. Lengthy discussion about lifestyle modification to prevent worsening ASCVD. Encouraged at least 150 minutes of moderate intensity exercise each week along with strengthening exercises such as squats and potential use of an app to track calories like My Fitness Pal. Continue metoprolol .  Hypertension: BP is well controlled. She is feeling better since  changing lopressor  dose to 50 mg BID. Continue lisinopril  and metoprolol .    Wheezing: Expiratory wheezing noted on exam.  She reports she notes this more in hot weather.  Encouraged smoking cessation. No increased work of breathing, no acute concerns. Management per PCP.  Tobacco abuse: She quit in 2022 but unfortunately started back approximately 1 year ago. She would like to try Chantix . Prescription and goals of quitting reviewed. Complete cessation advised.            Disposition: 6 months with me (per pt request)  Signed, Rosaline Bane, NP-C

## 2023-07-16 NOTE — Patient Instructions (Signed)
 Medication Instructions:   varenicline  (CHANTIX ) 0.5 MG tablet 11 tablet 0 07/16/2023 --   Sig: Take 0.5 mg (1 pill) for 3 days then take 0.5 mg (1 pill) twice daily for 4 days then change to 1.0 mg dose   Sent to pharmacy as: varenicline  (CHANTIX ) 0.5 MG tablet    START Rosuvastatin one (1) tablet by mouth ( 5 mg) daily.   START Asprin one (1) tablet by mouth ( 81 mg ) daily.    *If you need a refill on your cardiac medications before your next appointment, please call your pharmacy*  Lab Work:  None ordered.  If you have labs (blood work) drawn today and your tests are completely normal, you will receive your results only by: MyChart Message (if you have MyChart) OR A paper copy in the mail If you have any lab test that is abnormal or we need to change your treatment, we will call you to review the results.  Testing/Procedures:  None ordered.  Follow-Up: At [facility], you and your health needs are our priority.  As part of our continuing mission to provide you with exceptional heart care, our providers are all part of one team.  This team includes your primary Cardiologist (physician) and Advanced Practice Providers or APPs (Physician Assistants and Nurse Practitioners) who all work together to provide you with the care you need, when you need it.  Your next appointment:   6 month(s)  Provider:   Rosaline Bane, NP    We recommend signing up for the patient portal called MyChart.  Sign up information is provided on this After Visit Summary.  MyChart is used to connect with patients for Virtual Visits (Telemedicine).  Patients are able to view lab/test results, encounter notes, upcoming appointments, etc.  Non-urgent messages can be sent to your provider as well.   To learn more about what you can do with MyChart, go to [email or web address].

## 2023-07-17 ENCOUNTER — Encounter (HOSPITAL_BASED_OUTPATIENT_CLINIC_OR_DEPARTMENT_OTHER): Payer: Self-pay | Admitting: Nurse Practitioner

## 2023-07-30 ENCOUNTER — Other Ambulatory Visit: Payer: Self-pay | Admitting: Family Medicine

## 2023-08-06 ENCOUNTER — Encounter: Payer: Self-pay | Admitting: Family Medicine

## 2023-08-06 ENCOUNTER — Other Ambulatory Visit: Payer: Self-pay

## 2023-08-06 MED ORDER — HYDROCHLOROTHIAZIDE 25 MG PO TABS: 25.0000 mg | ORAL_TABLET | Freq: Every day | ORAL | 0 refills | Status: DC

## 2023-08-06 MED ORDER — VENLAFAXINE HCL ER 75 MG PO CP24: 75.0000 mg | ORAL_CAPSULE | Freq: Every day | ORAL | 0 refills | Status: DC

## 2023-08-07 NOTE — Telephone Encounter (Signed)
 No further action needed.

## 2023-08-19 ENCOUNTER — Ambulatory Visit (INDEPENDENT_AMBULATORY_CARE_PROVIDER_SITE_OTHER): Admitting: Family Medicine

## 2023-08-19 ENCOUNTER — Encounter: Payer: Self-pay | Admitting: Family Medicine

## 2023-08-19 VITALS — BP 136/78 | HR 71 | Temp 98.1°F | Ht 63.5 in | Wt 212.8 lb

## 2023-08-19 DIAGNOSIS — I10 Essential (primary) hypertension: Secondary | ICD-10-CM

## 2023-08-19 MED ORDER — LISINOPRIL 40 MG PO TABS: 40.0000 mg | ORAL_TABLET | Freq: Every day | ORAL | 3 refills | Status: AC

## 2023-08-19 MED ORDER — VENLAFAXINE HCL ER 75 MG PO CP24: 75.0000 mg | ORAL_CAPSULE | Freq: Every day | ORAL | 3 refills | Status: AC

## 2023-08-19 MED ORDER — HYDROCHLOROTHIAZIDE 25 MG PO TABS: 25.0000 mg | ORAL_TABLET | Freq: Every day | ORAL | 3 refills | Status: AC

## 2023-08-19 NOTE — Progress Notes (Signed)
 OFFICE VISIT  08/19/2023  CC:  Chief Complaint  Patient presents with   Medical Management of Chronic Issues    Patient is a 58 y.o. female who presents for follow-up hypertension, copd, and anx/dep.  INTERIM HX: Feeling well other than having heartburn a lot lately.  Almost always in the evenings.  She cannot pinpoint any particular food that is causing it. She does not overeat. She has taken Tums on occasion and has helped a little bit.  She says her blood pressures consistently normal.  She uses her Trelegy 1 puff daily and finds this significantly helpful. She was prescribed Chantix  recently by her cardiology provider and she is about to start it for smoking cessation. She does have pretty constant wheezing but does not feel particularly short of breath.  She does not exercise, mainly due to the heat lately.  She does not use albuterol .  She was placed on rosuvastatin  by cardiology as well. She has apprehension about taking the medication, particularly since she noticed that her cholesterol numbers are all excellent already.  ROS as above, plus--> no fevers, no CP, no cough, no dizziness, no HAs, no rashes, no melena/hematochezia.  No polyuria or polydipsia.  No myalgias or arthralgias.  No focal weakness, paresthesias, or tremors.  . No n/v/d or abd pain.  No palpitations.    Past Medical History:  Diagnosis Date   Anxiety and depression 11/11/2011   Started counseling 12/2015, counselor recommended effexor  so I started it 01/02/16.   Aortic atherosclerosis (HCC)    Coronary calcium  CT 11/2022   Chronic bronchitis (HCC)    Hearing loss, left 2019   Mild, conductive.  However, unclear etiology.  ENT considering dx of SSNHL or otosclerosis.  ENT rechecking 2 wks as of 08/03/17 office note.   Hypertension    Menometrorrhagia 11/11/2011   Multiple pulmonary nodules 11/20/2020   Lung ca screening CT-->LungRADS 4B, suspicious-> referral to Dr. Brenna.  PET 12/12/20 showed low  uptake in the nodule. Slight imp on CT 05/2021, plan per pulm is rpt CT 6 mo.   Overweight(278.02) 11/11/2011   Lipids excellent 10/2012   Palpitations 05/2020   Perimenopausal disorder 2013   Sigmoid diverticulosis    on colonoscopy 02/2017   Tobacco dependence in remission    quit 2022    Past Surgical History:  Procedure Laterality Date   CESAREAN SECTION  1991   COLONOSCOPY W/ POLYPECTOMY  03/06/2017   adenomatous polyp: recall 5 yrs   coronary calcium  score     November 2024, score 393, 98th %'tile   CT CTA CORONARY W/CA SCORE W/CM &/OR WO/CM  02/2023   Mild non-obstructive CAD (25-49%) - proximal and mid LAD.    Outpatient Medications Prior to Visit  Medication Sig Dispense Refill   albuterol  (VENTOLIN  HFA) 108 (90 Base) MCG/ACT inhaler INHALE 2 PUFFS INTO THE LUNGS EVERY 4 HOURS AS NEEDED FOR WHEEZE OR FOR SHORTNESS OF BREATH 8.5 each 2   aspirin  EC 81 MG tablet Take 1 tablet (81 mg total) by mouth daily. Swallow whole.     Fluticasone -Umeclidin-Vilant (TRELEGY ELLIPTA ) 100-62.5-25 MCG/ACT AEPB Inhale 1 puff into the lungs daily. 60 each 11   metoprolol  tartrate (LOPRESSOR ) 50 MG tablet Take 1 tablet (50 mg total) by mouth 2 (two) times daily. 180 tablet 3   rosuvastatin  (CRESTOR ) 5 MG tablet Take 1 tablet (5 mg total) by mouth daily. 90 tablet 3   varenicline  (CHANTIX ) 1 MG tablet Take 1 mg by mouth 2 (two)  times daily.     hydrochlorothiazide  (HYDRODIURIL ) 25 MG tablet Take 1 tablet (25 mg total) by mouth daily. 30 tablet 0   lisinopril  (ZESTRIL ) 40 MG tablet TAKE 1 TABLET BY MOUTH EVERY DAY 36 tablet 0   varenicline  (CHANTIX ) 0.5 MG tablet Take 0.5 mg (1 pill) for 3 days then take 0.5 mg (1 pill) twice daily for 4 days then change to 1.0 mg dose (Patient not taking: Reported on 08/19/2023) 11 tablet 0   varenicline  (CHANTIX ) 1 MG tablet Finish 0.5 mg tablets first. Then start 1 mg twice daily x 11 weeks (Patient not taking: Reported on 08/19/2023) 210 tablet 0   venlafaxine  XR  (EFFEXOR -XR) 75 MG 24 hr capsule Take 1 capsule (75 mg total) by mouth daily with breakfast. 30 capsule 0   No facility-administered medications prior to visit.    Allergies  Allergen Reactions   Tamiflu  [Oseltamivir ] Nausea Only    Review of Systems As per HPI  PE:    08/19/2023    3:48 PM 07/16/2023    3:48 PM 03/03/2023    9:20 AM  Vitals with BMI  Height 5' 3.5 5' 3.5   Weight 212 lbs 13 oz 212 lbs 13 oz   BMI 37.1 37.1   Systolic 136 118 877  Diastolic 78 70 71  Pulse 71 72 68     Physical Exam  Gen: Alert, well appearing.  Patient is oriented to person, place, time, and situation. AFFECT: pleasant, lucid thought and speech. CV: RRR, no murmur Lungs: Diffuse inspiratory and expiratory wheezing, aeration is good, breathing is nonlabored.   LABS:  Last CBC Lab Results  Component Value Date   WBC 6.8 10/23/2022   HGB 14.0 10/23/2022   HCT 42.4 10/23/2022   MCV 91.0 10/23/2022   MCH 31.6 06/20/2020   RDW 13.1 10/23/2022   PLT 381.0 10/23/2022   Last metabolic panel Lab Results  Component Value Date   GLUCOSE 102 (H) 10/23/2022   NA 133 (L) 10/23/2022   K 4.3 10/23/2022   CL 97 10/23/2022   CO2 29 10/23/2022   BUN 10 10/23/2022   CREATININE 0.76 10/23/2022   GFR 86.43 10/23/2022   CALCIUM  9.4 10/23/2022   PHOS 3.0 11/11/2011   PROT 7.2 10/23/2022   ALBUMIN 4.1 10/23/2022   LABGLOB 2.4 01/15/2018   AGRATIO 1.9 01/15/2018   BILITOT 0.4 10/23/2022   ALKPHOS 90 10/23/2022   AST 21 10/23/2022   ALT 19 10/23/2022   ANIONGAP 10 07/15/2018   Last lipids Lab Results  Component Value Date   CHOL 150 10/23/2022   HDL 71.30 10/23/2022   LDLCALC 64 10/23/2022   TRIG 75.0 10/23/2022   CHOLHDL 2 10/23/2022   Last hemoglobin A1c Lab Results  Component Value Date   HGBA1C 5.7 02/18/2022   Last thyroid  functions Lab Results  Component Value Date   TSH 1.67 10/23/2022   IMPRESSION AND PLAN:  #1 hypertension, well-controlled on HCTZ 25 mg a day,  lisinopril  40 mg a day, and Lopressor  50 mg twice a day. Electrolytes and creatinine monitoring today.  2.  COPD/chronic bronchitis. Trelegy Ellipta  1 puff daily helps her a lot. She has albuterol  to use as rescue as needed. She is going to try to quit smoking again with the use of Chantix -> prescription already provided by her cardiologist.  3.  Coronary artery disease. Asymptomatic. I encouraged her to start the rosuvastatin  that cardiology prescribed recently. She takes aspirin  81 mg a day. We will check  lipids in 6 months unless cardiology has done so first.  #4 anxiety/depression. Doing well on Effexor  XR 75 mg a day long-term.  An After Visit Summary was printed and given to the patient.  FOLLOW UP: Return in about 6 months (around 02/19/2024) for annual CPE (fasting).  Signed:  [name], MD           08/19/2023

## 2023-08-20 LAB — BASIC METABOLIC PANEL WITH GFR
BUN: 13 mg/dL (ref 6–23)
CO2: 31 meq/L (ref 19–32)
Calcium: 9.5 mg/dL (ref 8.4–10.5)
Chloride: 93 meq/L — ABNORMAL LOW (ref 96–112)
Creatinine, Ser: 0.75 mg/dL (ref 0.40–1.20)
GFR: 87.31 mL/min (ref >60.00)
Glucose, Bld: 98 mg/dL (ref 70–99)
Potassium: 4 meq/L (ref 3.5–5.1)
Sodium: 130 meq/L — ABNORMAL LOW (ref 135–145)

## 2023-08-21 ENCOUNTER — Ambulatory Visit: Payer: Self-pay | Admitting: Family Medicine

## 2023-10-05 ENCOUNTER — Telehealth: Payer: Self-pay | Admitting: Acute Care

## 2023-10-05 NOTE — Telephone Encounter (Signed)
 Called and spoke to pt. She meets the criteria for LCS, SDMV has been scheduled. However, pt states in the past she schedules the LDCT a different way that is at no cost to her and is through her work. Patient states she is going to call to work out the details and call us  back to schedule her LDCT.    Lung Cancer Screening Narrative/Criteria Questionnaire (Cigarette Smokers Only- No Cigars/Pipes/vapes)   Kristin Day   SDMV:10/13/2023 8:30 Laneta        04/29/1965   LDCT: Patient will call back to schedule, will need to be after 11/05/2023    58 y.o.   Phone: [phone]  Lung Screening Narrative (confirm age 87-77 yrs Medicare / 50-80 yrs Private pay insurance)   Insurance information:Med[employer]   Referring Provider:Dr. Icard   This screening involves an initial phone call with a team member from our program. It is called a shared decision making visit. The initial meeting is required by  insurance and Medicare to make sure you understand the program. This appointment takes about 15-20 minutes to complete. You will complete the screening scan at your scheduled date/time.  This scan takes about 5-10 minutes to complete. You can eat and drink normally before and after the scan.  Criteria questions for Lung Cancer Screening:   Are you a current or former smoker? Current Age began smoking: 58yo   If you are a former smoker, what year did you quit smoking? Patient quit smoking for about 1.5 years then started smoking again (within 15 yrs)   To calculate your smoking history, I need an accurate estimate of how many packs of cigarettes you smoked per day and for how many years. (Not just the number of PPD you are now smoking)   Years smoking 44.5 x Packs per day 3/4 = Pack years 33   (at least 20 pack yrs)   (Make sure they understand that we need to know how much they have smoked in the past, not just the number of PPD they are smoking now)  Do you have a personal history of cancer?  No     Do you have a family history of cancer? No  Are you coughing up blood?  No  Have you had unexplained weight loss of 15 lbs or more in the last 6 months? No  It looks like you meet all criteria.  When would be a good time for us  to schedule you for this screening?   Additional information: N/A

## 2023-10-07 ENCOUNTER — Ambulatory Visit: Admitting: Family Medicine

## 2023-10-07 ENCOUNTER — Encounter: Payer: Self-pay | Admitting: Family Medicine

## 2023-10-07 VITALS — BP 128/78 | HR 72 | Temp 97.5°F | Ht 63.5 in | Wt 217.0 lb

## 2023-10-07 DIAGNOSIS — J040 Acute laryngitis: Secondary | ICD-10-CM

## 2023-10-07 DIAGNOSIS — J209 Acute bronchitis, unspecified: Secondary | ICD-10-CM | POA: Diagnosis not present

## 2023-10-07 DIAGNOSIS — J069 Acute upper respiratory infection, unspecified: Secondary | ICD-10-CM

## 2023-10-07 DIAGNOSIS — J42 Unspecified chronic bronchitis: Secondary | ICD-10-CM

## 2023-10-07 MED ORDER — AIRSUPRA 90-80 MCG/ACT IN AERO: 1.0000 | INHALATION_SPRAY | Freq: Four times a day (QID) | RESPIRATORY_TRACT | 1 refills | Status: DC | PRN

## 2023-10-07 MED ORDER — METOPROLOL TARTRATE 50 MG PO TABS: 50.0000 mg | ORAL_TABLET | Freq: Two times a day (BID) | ORAL | 3 refills | Status: AC

## 2023-10-07 NOTE — Progress Notes (Signed)
 OFFICE VISIT  10/07/2023  CC:  Chief Complaint  Patient presents with   Hoarseness    Pt lost her voice yesterday; thinks it may be related to her lungs; productive cough but phlegm not coming out. Denies sore throat    Patient is a 58 y.o. female who presents for voice concerns.  HPI: About 2 days ago she developed some increasing shortness of breath and her cough went from rattly and somewhat productive of clear mucus to a cough that does not produce any mucus. She feels like she must concentrate more on taking a deep breath and blowing all her air out. Yesterday she lost her voice completely. She does still smoke and says that recently at the beach she smoked more than her normal.  She denies headache, sore throat, runny nose, fever, or malaise.  She is eating and drinking well.  No nausea, abdominal pain, diarrhea, or rash.  Past Medical History:  Diagnosis Date   Anxiety and depression 11/11/2011   Started counseling 12/2015, counselor recommended effexor  so I started it 01/02/16.   Aortic atherosclerosis (HCC)    Coronary calcium  CT 11/2022   Chronic bronchitis (HCC)    Hearing loss, left 2019   Mild, conductive.  However, unclear etiology.  ENT considering dx of SSNHL or otosclerosis.  ENT rechecking 2 wks as of 08/03/17 office note.   Hypertension    Menometrorrhagia 11/11/2011   Multiple pulmonary nodules 11/20/2020   Lung ca screening CT-->LungRADS 4B, suspicious-> referral to Dr. Brenna.  PET 12/12/20 showed low uptake in the nodule. Slight imp on CT 05/2021, plan per pulm is rpt CT 6 mo.   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    Outpatient Medications Prior to Visit  Medication Sig Dispense Refill   albuterol  (VENTOLIN  HFA) 108 (90 Base) MCG/ACT inhaler INHALE 2 PUFFS INTO THE LUNGS EVERY 4 HOURS AS NEEDED FOR WHEEZE OR FOR SHORTNESS OF BREATH 8.5 each 2   aspirin  EC 81 MG tablet Take 1 tablet (81 mg total) by mouth daily. Swallow whole.     Fluticasone -Umeclidin-Vilant (TRELEGY ELLIPTA ) 100-62.5-25 MCG/ACT AEPB Inhale 1 puff into the lungs daily. 60 each 11   hydrochlorothiazide  (HYDRODIURIL ) 25 MG tablet Take 1 tablet (25 mg total) by mouth daily. 90 tablet 3   lisinopril  (ZESTRIL ) 40 MG tablet Take 1 tablet (40 mg total) by mouth daily. 90 tablet 3   rosuvastatin  (CRESTOR ) 5 MG tablet Take 1 tablet (5 mg total) by mouth daily. 90 tablet 3   venlafaxine  XR (EFFEXOR -XR) 75 MG 24 hr capsule Take 1 capsule (75 mg total) by mouth daily with breakfast. 90 capsule 3   metoprolol  tartrate (LOPRESSOR ) 50 MG tablet Take 1 tablet (50 mg total) by mouth 2 (two) times daily. 180 tablet 3   varenicline  (CHANTIX ) 1 MG tablet Take 1 mg by mouth 2 (two) times daily. (Patient not taking: Reported on 10/07/2023)  No facility-administered medications prior to visit.    Allergies  Allergen Reactions   Tamiflu  [Oseltamivir ] Nausea Only    Review of Systems  As per HPI  PE:    10/07/2023   10:16 AM 08/19/2023    3:48 PM 07/16/2023    3:48 PM  Vitals with BMI  Height 5' 3.5 5' 3.5 5' 3.5  Weight 217 lbs 212 lbs 13 oz 212 lbs 13 oz  BMI 37.83 37.1 37.1  Systolic 128 136 881  Diastolic 78 78 70  Pulse 72 71 72     Physical Exam  Gen: Alert, well appearing.  Patient is oriented to person, place, time, and situation. AFFECT: pleasant, lucid thought and speech. ZWU:Zbzd: no injection, icteris, swelling, or exudate.  EOMI, PERRLA. Mouth: lips  without lesion/swelling.  Oral mucosa pink and moist. Oropharynx without erythema, exudate, or swelling.  Cardiovascular: Regular rhythm and rate without murmur. Lungs are clear to auscultation on inspiration, with coarse wheezing at the end of expiration and a significantly prolonged expiratory phase.  Her breathing is mildly labored when she talks.  LABS:  Last CBC Lab Results  Component Value Date   WBC 6.8 10/23/2022   HGB 14.0 10/23/2022   HCT 42.4 10/23/2022   MCV 91.0 10/23/2022   MCH 31.6 06/20/2020   RDW 13.1 10/23/2022   PLT 381.0 10/23/2022   Last metabolic panel Lab Results  Component Value Date   GLUCOSE 98 08/19/2023   NA 130 (L) 08/19/2023   K 4.0 08/19/2023   CL 93 (L) 08/19/2023   CO2 31 08/19/2023   BUN 13 08/19/2023   CREATININE 0.75 08/19/2023   GFR 87.31 08/19/2023   CALCIUM  9.5 08/19/2023   PHOS 3.0 11/11/2011   PROT 7.2 10/23/2022   ALBUMIN 4.1 10/23/2022   LABGLOB 2.4 01/15/2018   AGRATIO 1.9 01/15/2018   BILITOT 0.4 10/23/2022   ALKPHOS 90 10/23/2022   AST 21 10/23/2022   ALT 19 10/23/2022   ANIONGAP 10 07/15/2018   IMPRESSION AND PLAN:  URI with laryngitis, with acute flare of chronic bronchitis.  Suspect viral etiology. Will try to avoid systemic steroids at this time. Prescription for Airsupra  to take 1 puff every 4 hours as needed until this exacerbation has resolved. She will continue her Trelegy as well.  An After Visit Summary was printed and given to the patient.  FOLLOW UP: Return for 5-7d f/u laryngitis/bronchitis. Next CPE October 2025 Signed:  [name], MD           10/07/2023

## 2023-10-13 ENCOUNTER — Ambulatory Visit (INDEPENDENT_AMBULATORY_CARE_PROVIDER_SITE_OTHER): Admitting: *Deleted

## 2023-10-13 ENCOUNTER — Encounter: Payer: Self-pay | Admitting: *Deleted

## 2023-10-13 DIAGNOSIS — F17210 Nicotine dependence, cigarettes, uncomplicated: Secondary | ICD-10-CM | POA: Diagnosis not present

## 2023-10-13 NOTE — Progress Notes (Signed)
 Virtual Visit via Telephone Note  I connected with Kristin Day on 10/13/23 at  8:30 AM EDT by telephone and verified that I am speaking with the correct person using two identifiers.  Location: Patient: at home Provider: [address], [place], [place], [address]    I discussed the limitations, risks, security and privacy concerns of performing an evaluation and management service by telephone and the availability of in person appointments. I also discussed with the patient that there may be a patient responsible charge related to this service. The patient expressed understanding and agreed to proceed.   Shared Decision Making Visit Lung Cancer Screening Program [phone])   Eligibility: Age 58 y.o. Pack Years Smoking History Calculation 33 (# packs/per year x # years smoked) Recent History of coughing up blood  no Unexplained weight loss? no ( >Than 15 pounds within the last 6 months ) Prior History Lung / other cancer no (Diagnosis within the last 5 years already requiring surveillance chest CT Scans). Smoking Status Current Smoker Former Smokers: Years since quit: n/a  Quit Date: n/a  Visit Components: Discussion included one or more decision making aids. yes Discussion included risk/benefits of screening. yes Discussion included potential follow up diagnostic testing for abnormal scans. yes Discussion included meaning and risk of over diagnosis. yes Discussion included meaning and risk of False Positives. yes Discussion included meaning of total radiation exposure. yes  Counseling Included: Importance of adherence to annual lung cancer LDCT screening. yes Impact of comorbidities on ability to participate in the program. yes Ability and willingness to under diagnostic treatment. yes  Smoking Cessation Counseling: Current Smokers:  Discussed importance of smoking cessation. yes Information about tobacco cessation classes and interventions provided to patient.  yes Patient provided with ticket for LDCT Scan. no Symptomatic Patient. no  Counseling(Intermediate counseling: > three minutes) 99406 Diagnosis Code: Tobacco Use Z72.0 Asymptomatic Patient yes   Counseled patient 4 minutes regarding tobacco use.   Former Smokers:  Discussed the importance of maintaining cigarette abstinence. yes Diagnosis Code: Personal History of Nicotine Dependence. S12.108 Information about tobacco cessation classes and interventions provided to patient. Yes Patient provided with ticket for LDCT Scan. no Written Order for Lung Cancer Screening with LDCT placed in Epic. Yes (CT Chest Lung Cancer Screening Low Dose W/O CM) [id number] Z12.2-Screening of respiratory organs Z87.891-Personal history of nicotine dependence   Laneta Speaks, RN

## 2023-10-13 NOTE — Patient Instructions (Signed)

## 2023-10-16 ENCOUNTER — Ambulatory Visit: Payer: Self-pay

## 2023-10-16 NOTE — Telephone Encounter (Signed)
 Pt has scheduled appt for Monday 9/29, no further action needed

## 2023-10-16 NOTE — Telephone Encounter (Signed)
 FYI Only or Action Required?: FYI only for provider.  Patient was last seen in primary care on 10/07/2023 by McGowen, Aleene DEL, MD.  Called Nurse Triage reporting Cough.  Symptoms began several weeks ago.  Interventions attempted: Nothing.  Symptoms are: unchanged.  Triage Disposition: See PCP When Office is Open (Within 3 Days)  Patient/caregiver understands and will follow disposition?: Yes Reason for Disposition  Cough has been present for > 3 weeks  Answer Assessment - Initial Assessment Questions Can't talk without coughing, can't breath very well. Seen on 9/17 for acute laryngitis, given an inhaler but did not fill it due to the price at pharmacy.   1. ONSET: When did the cough begin?      Over a week, dry cough is fairly new  2. SEVERITY: How bad is the cough today?      Can barely go a minute without coughing, hard to talk / take in a deep breath  3. SPUTUM: Describe the color of your sputum (e.g., none, dry cough; clear, white, yellow, green)     Dry cough, can't cough anything up but feels its all trapped in the chest  4. DIFFICULTY BREATHING: Are you having difficulty breathing? If Yes, ask: How bad is it? (e.g., mild, moderate, severe)      Mild   5. FEVER: Do you have a fever? If Yes, ask: What is your temperature, how was it measured, and when did it start?     No  6. OTHER SYMPTOMS: Do you have any other symptoms? (e.g., runny nose, wheezing, chest pain)       Chest feels tight with congestion  Protocols used: Cough - Acute Non-Productive-A-AH  Copied from CRM #[id number]. Topic: Clinical - Red Word Triage >> Oct 16, 2023  1:54 PM Chiquita SQUIBB wrote: Red Word that prompted transfer to Nurse Triage: Patient is calling in stating she saw the doctor on 09/17 and is getting worse, patient has not improved at all.    ----------------------------------------------------------------------- From previous Reason for Contact - Scheduling: Patient/patient  representative is calling to schedule an appointment. Refer to attachments for appointment information.

## 2023-10-19 ENCOUNTER — Encounter: Payer: Self-pay | Admitting: Family Medicine

## 2023-10-19 ENCOUNTER — Ambulatory Visit: Admitting: Family Medicine

## 2023-10-19 VITALS — BP 130/80 | HR 75 | Temp 97.9°F | Ht 63.5 in | Wt 214.4 lb

## 2023-10-19 DIAGNOSIS — J440 Chronic obstructive pulmonary disease with acute lower respiratory infection: Secondary | ICD-10-CM

## 2023-10-19 DIAGNOSIS — J209 Acute bronchitis, unspecified: Secondary | ICD-10-CM

## 2023-10-19 DIAGNOSIS — J449 Chronic obstructive pulmonary disease, unspecified: Secondary | ICD-10-CM

## 2023-10-19 MED ORDER — PREDNISONE 10 MG PO TABS: ORAL_TABLET | ORAL | 0 refills | Status: DC

## 2023-10-19 MED ORDER — METHYLPREDNISOLONE ACETATE 80 MG/ML IJ SUSP
80.0000 mg | Freq: Once | INTRAMUSCULAR | Status: AC
  Administered 2023-10-19: 80 mg via INTRAMUSCULAR

## 2023-10-19 MED ORDER — CEFDINIR 300 MG PO CAPS: 300.0000 mg | ORAL_CAPSULE | Freq: Two times a day (BID) | ORAL | 0 refills | Status: AC

## 2023-10-19 NOTE — Progress Notes (Signed)
 OFFICE VISIT  10/19/2023  CC:  Chief Complaint  Patient presents with   Cough    Patient is a 58 y.o. female who presents for cough. I saw her on 10/07/2023 for URI with laryngitis and acute flare of chronic bronchitis.  I prescribed Airsupra  and held off on treating with prednisone  at that time.  INTERIM HX: Over the last 10 days has had significantly worsened nasal congestion, rattly cough productive of thick and colored mucus, dyspnea on exertion.  No fever.  No chest pain.  No lower extremity edema. She was unable to afford the Airsupra  prescribed at last visit. She has been taking albuterol  inhaler every 4 hours and still taking her Trelegy Ellipta  1 puff daily.  Review of systems: Decreased appetite but no nausea or vomiting. Very fatigued. No rash, no headache, no abdominal pain.  Past Medical History:  Diagnosis Date   Anxiety and depression 11/11/2011   Started counseling 12/2015, counselor recommended effexor  so I started it 01/02/16.   Aortic atherosclerosis    Coronary calcium  CT 11/2022   Chronic bronchitis (HCC)    Hearing loss, left 2019   Mild, conductive.  However, unclear etiology.  ENT considering dx of SSNHL or otosclerosis.  ENT rechecking 2 wks as of 08/03/17 office note.   Hypertension    Menometrorrhagia 11/11/2011   Multiple pulmonary nodules 11/20/2020   Lung ca screening CT-->LungRADS 4B, suspicious-> referral to Dr. Brenna.  PET 12/12/20 showed low uptake in the nodule. Slight imp on CT 05/2021, plan per pulm is rpt CT 6 mo.   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    Outpatient Medications Prior to Visit  Medication Sig Dispense Refill   aspirin  EC 81 MG tablet Take 1 tablet (81 mg total) by mouth daily. Swallow whole.     hydrochlorothiazide  (HYDRODIURIL ) 25 MG tablet Take 1 tablet (25 mg total) by mouth daily. 90 tablet 3   lisinopril  (ZESTRIL ) 40 MG tablet Take 1 tablet (40 mg total) by mouth daily. 90 tablet 3   metoprolol  tartrate (LOPRESSOR ) 50 MG tablet Take 1 tablet (50 mg total) by mouth 2 (two) times daily. 180 tablet 3   venlafaxine  XR (EFFEXOR -XR) 75 MG 24 hr capsule Take 1 capsule (75 mg total) by mouth daily with breakfast. 90 capsule 3   albuterol  (VENTOLIN  HFA) 108 (90 Base) MCG/ACT inhaler INHALE 2 PUFFS INTO THE LUNGS EVERY 4 HOURS AS NEEDED FOR WHEEZE OR FOR SHORTNESS OF BREATH (Patient not taking: Reported on 10/19/2023) 8.5 each 2   Fluticasone -Umeclidin-Vilant (TRELEGY ELLIPTA ) 100-62.5-25 MCG/ACT AEPB Inhale 1 puff into the lungs daily. 60 each 11   rosuvastatin  (CRESTOR ) 5 MG tablet Take 1 tablet (5 mg total) by mouth daily. (Patient not taking: Reported on 10/19/2023) 90 tablet 3   varenicline  (CHANTIX ) 1 MG tablet Take 1 mg by mouth 2 (two) times daily. (Patient not taking: Reported on 10/19/2023)     Albuterol -Budesonide (  AIRSUPRA ) 90-80 MCG/ACT AERO Inhale 1 Inhalation into the lungs 4 (four) times daily as needed. (Patient not taking: Reported on 10/19/2023) 5.9 g 1   No facility-administered medications prior to visit.    Allergies  Allergen Reactions   Tamiflu  [Oseltamivir ] Nausea Only    Review of Systems  As per HPI  PE:    10/19/2023    9:09 AM 10/07/2023   10:16 AM 08/19/2023    3:48 PM  Vitals with BMI  Height 5' 3.5 5' 3.5 5' 3.5  Weight 214 lbs 6 oz 217 lbs 212 lbs 13 oz  BMI 37.38 37.83 37.1  Systolic 130 128 863  Diastolic 80 78 78  Pulse 75 72 71  O2 sat on room air today is  95%   Physical Exam  VS: noted--normal. Gen: alert, NAD, NONTOXIC APPEARING. HEENT: eyes without injection, drainage, or swelling.  Ears: EACs clear, TMs with normal light reflex and landmarks.  Nose: Clear rhinorrhea, with some dried, crusty exudate adherent to mildly injected mucosa.  No purulent d/c.  No paranasal sinus TTP.  No facial swelling.  Throat and mouth without focal lesion.  No pharyngial swelling, erythema, or exudate.   Neck: supple, no LAD.   LUNGS: CTA bilat, nonlabored resps.  She has diffusely decreased aeration on inspiration and expiration.  She has significantly prolonged expiratory phase and coarse expiratory wheezing. CV: RRR, no m/r/g. EXT: no c/c/e SKIN: no rash  LABS:  Last CBC Lab Results  Component Value Date   WBC 6.8 10/23/2022   HGB 14.0 10/23/2022   HCT 42.4 10/23/2022   MCV 91.0 10/23/2022   MCH 31.6 06/20/2020   RDW 13.1 10/23/2022   PLT 381.0 10/23/2022   Last metabolic panel Lab Results  Component Value Date   GLUCOSE 98 08/19/2023   NA 130 (L) 08/19/2023   K 4.0 08/19/2023   CL 93 (L) 08/19/2023   CO2 31 08/19/2023   BUN 13 08/19/2023   CREATININE 0.75 08/19/2023   GFR 87.31 08/19/2023   CALCIUM  9.5 08/19/2023   PHOS 3.0 11/11/2011   PROT 7.2 10/23/2022   ALBUMIN 4.1 10/23/2022   LABGLOB 2.4 01/15/2018   AGRATIO 1.9 01/15/2018   BILITOT 0.4 10/23/2022   ALKPHOS 90 10/23/2022   AST 21 10/23/2022   ALT 19 10/23/2022   ANIONGAP 10 07/15/2018   IMPRESSION AND PLAN:  Acute COPD exacerbation, suspect bronchopneumonia. Depo-Medrol  80 mg IM here today.  Start prednisone  taper tomorrow (40 x 2, 30 x 2, 20 x 2, 10 x 2). Omnicef 300 mg twice daily x 10 days. Mucinex every 12 hours recommended. I prescribed her an AeroChamber to use with her albuterol  inhaler. Needs to stay out of work and rest for 2 days-> letter written today.  An After Visit Summary was printed and given to the patient.  FOLLOW UP: Return for 3-4d f/u  copd.  Signed:  [name], MD           10/19/2023

## 2023-10-23 ENCOUNTER — Encounter: Payer: Self-pay | Admitting: Family Medicine

## 2023-10-23 ENCOUNTER — Ambulatory Visit (INDEPENDENT_AMBULATORY_CARE_PROVIDER_SITE_OTHER): Admitting: Family Medicine

## 2023-10-23 VITALS — BP 128/78 | HR 65 | Temp 98.8°F | Ht 63.5 in | Wt 213.0 lb

## 2023-10-23 DIAGNOSIS — J441 Chronic obstructive pulmonary disease with (acute) exacerbation: Secondary | ICD-10-CM | POA: Diagnosis not present

## 2023-10-23 DIAGNOSIS — J180 Bronchopneumonia, unspecified organism: Secondary | ICD-10-CM

## 2023-10-23 MED ORDER — ALBUTEROL SULFATE HFA 108 (90 BASE) MCG/ACT IN AERS: 1.0000 | INHALATION_SPRAY | RESPIRATORY_TRACT | 2 refills | Status: AC | PRN

## 2023-10-23 NOTE — Progress Notes (Signed)
 OFFICE VISIT  10/23/2023  CC:  Chief Complaint  Patient presents with   Follow-up    3-4 day COPD    Patient is a 58 y.o. female who presents for 4-day follow-up acute bronchopneumonia and COPD exacerbation. A/P as of last visit: Acute COPD exacerbation, suspect bronchopneumonia. Depo-Medrol  80 mg IM here today.  Start prednisone  taper tomorrow (40 x 2, 30 x 2, 20 x 2, 10 x 2). Omnicef 300 mg twice daily x 10 days. Mucinex every 12 hours recommended. I prescribed her an AeroChamber to use with her albuterol  inhaler. Needs to stay out of work and rest for 2 days-> letter written today.  INTERIM HX: Kristin Day is feeling much better. No fever.  Less cough and wheezing.  More energy. She notes that since getting on the steroids her face actually feels less swollen and she has completely lost all the swelling in her feet and ankles--> this is the smallest my ankles have looked since I was 34!  Also, she started Chantix  about 4 days ago and does note a decreased desire to smoke.  Past Medical History:  Diagnosis Date   Anxiety and depression 11/11/2011   Started counseling 12/2015, counselor recommended effexor  so I started it 01/02/16.   Aortic atherosclerosis    Coronary calcium  CT 11/2022   Chronic bronchitis (HCC)    Hearing loss, left 2019   Mild, conductive.  However, unclear etiology.  ENT considering dx of SSNHL or otosclerosis.  ENT rechecking 2 wks as of 08/03/17 office note.   Hypertension    Menometrorrhagia 11/11/2011   Multiple pulmonary nodules 11/20/2020   Lung ca screening CT-->LungRADS 4B, suspicious-> referral to Dr. Brenna.  PET 12/12/20 showed low uptake in the nodule. Slight imp on CT 05/2021, plan per pulm is rpt CT 6 mo.   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    Outpatient Medications Prior to Visit  Medication Sig Dispense Refill   aspirin  EC 81 MG tablet Take 1 tablet (81 mg total) by mouth daily. Swallow whole.     cefdinir (OMNICEF) 300 MG capsule Take 1 capsule (300 mg total) by mouth 2 (two) times daily for 10 days. 20 capsule 0   Fluticasone -Umeclidin-Vilant (TRELEGY ELLIPTA ) 100-62.5-25 MCG/ACT AEPB Inhale 1 puff into the lungs daily. 60 each 11   hydrochlorothiazide  (HYDRODIURIL ) 25 MG tablet Take 1 tablet (25 mg total) by mouth daily. 90 tablet 3   lisinopril  (ZESTRIL ) 40 MG tablet Take 1 tablet (40 mg total) by mouth daily. 90 tablet 3   metoprolol  tartrate (LOPRESSOR ) 50 MG tablet Take 1 tablet (50 mg total) by mouth 2 (two) times daily. 180 tablet 3   predniSONE  (DELTASONE ) 10 MG tablet 40mg  every day x2d then 30mg  every day x 2d then 20mg  every day x 2d then 10mg  every day x 2 d 20 tablet 0   albuterol  (VENTOLIN  HFA) 108 (90 Base) MCG/ACT inhaler INHALE 2 PUFFS  INTO THE LUNGS EVERY 4 HOURS AS NEEDED FOR WHEEZE OR FOR SHORTNESS OF BREATH 8.5 each 2   rosuvastatin  (CRESTOR ) 5 MG tablet Take 1 tablet (5 mg total) by mouth daily. (Patient not taking: Reported on 10/23/2023) 90 tablet 3   varenicline  (CHANTIX ) 1 MG tablet Take 1 mg by mouth 2 (two) times daily. (Patient not taking: Reported on 10/23/2023)     venlafaxine  XR (EFFEXOR -XR) 75 MG 24 hr capsule Take 1 capsule (75 mg total) by mouth daily with breakfast. 90 capsule 3   No facility-administered medications prior to visit.    Allergies  Allergen Reactions   Tamiflu  [Oseltamivir ] Nausea Only    Review of Systems As per HPI  PE:    10/23/2023    2:09 PM 10/19/2023     9:09 AM 10/07/2023   10:16 AM  Vitals with BMI  Height 5' 3.5 5' 3.5 5' 3.5  Weight 213 lbs 214 lbs 6 oz 217 lbs  BMI 37.13 37.38 37.83  Systolic 128 130 871  Diastolic 78 80 78  Pulse 65 75 72  02 sat today is 97% on RA  Physical Exam  General: Alert and well-appearing. Affect is pleasant, thought and speech are lucid. Lungs are clear to auscultation on inspiration bilaterally, good aeration.  She has slight decreased aeration on exhalation, faint end-expiratory wheezing, and prolonged expiratory phase but improved compared to last visit. Extremities: No pitting edema  LABS:  Last CBC Lab Results  Component Value Date   WBC 6.8 10/23/2022   HGB 14.0 10/23/2022   HCT 42.4 10/23/2022   MCV 91.0 10/23/2022   MCH 31.6 06/20/2020   RDW 13.1 10/23/2022   PLT 381.0 10/23/2022   Last metabolic panel Lab Results  Component Value Date   GLUCOSE 98 08/19/2023   NA 130 (L) 08/19/2023   K 4.0 08/19/2023   CL 93 (L) 08/19/2023   CO2 31 08/19/2023   BUN 13 08/19/2023   CREATININE 0.75 08/19/2023   GFR 87.31 08/19/2023   CALCIUM  9.5 08/19/2023   PHOS 3.0 11/11/2011   PROT 7.2 10/23/2022   ALBUMIN 4.1 10/23/2022   LABGLOB 2.4 01/15/2018   AGRATIO 1.9 01/15/2018   BILITOT 0.4 10/23/2022   ALKPHOS 90 10/23/2022   AST 21 10/23/2022   ALT 19 10/23/2022   ANIONGAP 10 07/15/2018   IMPRESSION AND PLAN:  Acute bronchopneumonia with acute exacerbation of COPD. Much improved.  She will finish her prednisone  taper and her cefdinir. Continue Trelegy Ellipta  1 puff once a day and albuterol  inhaler 2 puffs every 4 hours as needed (she is using an AeroChamber now). She will continue Chantix  for assistance with smoking cessation.  An After Visit Summary was printed and given to the patient.  FOLLOW UP: Return if symptoms worsen or fail to improve.  Signed:  [name], MD           10/23/2023

## 2023-10-30 ENCOUNTER — Other Ambulatory Visit: Payer: Self-pay

## 2023-10-30 DIAGNOSIS — Z122 Encounter for screening for malignant neoplasm of respiratory organs: Secondary | ICD-10-CM

## 2023-10-30 DIAGNOSIS — Z87891 Personal history of nicotine dependence: Secondary | ICD-10-CM

## 2023-10-30 NOTE — Telephone Encounter (Addendum)
 New annual LCS order has been placed. Order faxed to facility.   [employer]-Order faxed from [employer], could you send from the office fax as well?   Kristin Day

## 2023-10-30 NOTE — Telephone Encounter (Signed)
 Copied from CRM [id number]. Topic: Clinical - Request for Lab/Test Order >> Oct 29, 2023  1:47 PM Russell PARAS wrote: Reason for CRM:   Pt is needing order for LCS sent to [employer], provided fax # [phone]  Requesting it be sent to [employer] so that she can receive the screening for free  CB#  [phone]

## 2023-10-30 NOTE — Telephone Encounter (Addendum)
 I have also faxed this from the office & received fax confirmation:

## 2023-10-30 NOTE — Telephone Encounter (Signed)
 Kristin Day D   10/30/2023 11:25 AM   Patient is calling to see what's going on with lcs order being sent to the third party company [employer] and the reason for this is because they partnered with [employer] for the employee program . Please send this order to the company or can someone give patient a call for further assistance  [phone]

## 2023-11-20 ENCOUNTER — Ambulatory Visit: Admitting: Family Medicine

## 2023-11-25 ENCOUNTER — Telehealth: Payer: Self-pay | Admitting: *Deleted

## 2023-11-25 ENCOUNTER — Other Ambulatory Visit: Payer: Self-pay | Admitting: *Deleted

## 2023-11-25 MED ORDER — TRELEGY ELLIPTA 100-62.5-25 MCG/ACT IN AEPB: 1.0000 | INHALATION_SPRAY | Freq: Every day | RESPIRATORY_TRACT | 1 refills | Status: DC

## 2023-11-25 NOTE — Telephone Encounter (Signed)
 Called and spoke with patient regarding recent lung screening CT done at [facility] Scan read as a Lung Rads 0. Pt was treated for supsected bronchopneumonia 10/23/23 with Omnicef/ Depo medrol  injection and prednisone  taper. Pt reports feeling better after 3 days but has now started feeling bad again. Pt reports that she has stopped smoking and is now started Chantix .  Patient is a former pt of Dr Brenna and would like to be seen by pulmonary. Pt given appt to see Lauraine Lites, NP 12/01/23.

## 2023-12-01 ENCOUNTER — Ambulatory Visit (INDEPENDENT_AMBULATORY_CARE_PROVIDER_SITE_OTHER)

## 2023-12-01 ENCOUNTER — Encounter: Payer: Self-pay | Admitting: Acute Care

## 2023-12-01 ENCOUNTER — Telehealth: Payer: Self-pay | Admitting: Acute Care

## 2023-12-01 ENCOUNTER — Ambulatory Visit (INDEPENDENT_AMBULATORY_CARE_PROVIDER_SITE_OTHER): Admitting: Acute Care

## 2023-12-01 VITALS — BP 110/56 | HR 71 | Temp 98.0°F | Ht 63.5 in | Wt 213.4 lb

## 2023-12-01 DIAGNOSIS — F17210 Nicotine dependence, cigarettes, uncomplicated: Secondary | ICD-10-CM | POA: Diagnosis not present

## 2023-12-01 DIAGNOSIS — R9389 Abnormal findings on diagnostic imaging of other specified body structures: Secondary | ICD-10-CM

## 2023-12-01 DIAGNOSIS — J449 Chronic obstructive pulmonary disease, unspecified: Secondary | ICD-10-CM | POA: Diagnosis not present

## 2023-12-01 DIAGNOSIS — R0602 Shortness of breath: Secondary | ICD-10-CM | POA: Diagnosis not present

## 2023-12-01 DIAGNOSIS — R0789 Other chest pain: Secondary | ICD-10-CM

## 2023-12-01 DIAGNOSIS — J479 Bronchiectasis, uncomplicated: Secondary | ICD-10-CM

## 2023-12-01 DIAGNOSIS — F17200 Nicotine dependence, unspecified, uncomplicated: Secondary | ICD-10-CM

## 2023-12-01 DIAGNOSIS — R053 Chronic cough: Secondary | ICD-10-CM

## 2023-12-01 DIAGNOSIS — J440 Chronic obstructive pulmonary disease with acute lower respiratory infection: Secondary | ICD-10-CM

## 2023-12-01 MED ORDER — DOXYCYCLINE HYCLATE 100 MG PO TABS: 100.0000 mg | ORAL_TABLET | Freq: Two times a day (BID) | ORAL | 0 refills | Status: DC

## 2023-12-01 NOTE — Telephone Encounter (Signed)
 Called patient and let her know.NFN

## 2023-12-01 NOTE — Patient Instructions (Addendum)
 It is good to see you today. Start Mucinex 1200 mg daily with a full glass of water to thin secretions. Start Flutter valve 4 times daily, 4-6 blows at a time. We will do Cultures of the sputum after 6 weeks ( to ensure recent antibiotics are out of your system) CXR today. I will call you with the results. Follow up in 6 weeks to ensure you are better. Repeat Low Dose CT chest in 6 weeks. You will get a call to get this scheduled.  Continue Trelegy 1 puff once daily. Rinse mouth after use. Albuterol  as needed for shortness of breath or wheezing Please contact office for sooner follow up if symptoms do not improve or worsen or seek emergency care

## 2023-12-01 NOTE — Progress Notes (Signed)
 History of Present Illness Kristin Day is a 58 y.o. female current every day smoker with initially referred for abnormal lung cancer screening. Here today for follow up on a LR 0 scan.    12/01/2023 Discussed the use of AI scribe software for clinical note transcription with the patient, who gave verbal consent to proceed.  History of Present Illness Pt. Presents for follow up after a LDCT scan done 11/15/2023 was read as a LR 0. The scan showed new and Increased bilateral clustered tree-in-bud opacities and. Increased in density of groundglass opacity in the left lower lobe increased consolidation in the right middle lobe. Overall findings are likely secondary to chronic infection such as NTM. Previous LDCT findings were + for a  19 mm lung nodule identified on a lung cancer screening two years ago, which showed low uptake on a PET scan. Subsequent CT scans indicated the nodule disappeared, again leaning toward a infectious etiology.  Several days before LDCT she was sick, 10/23/2023 she  went to PCP. Treated with Depo-Medrol  80 mg IM   Start prednisone  taper  (40 x 2, 30 x 2, 20 x 2, 10 x 2). Omnicef 300 mg twice daily x 10 days.She felt better after 3 days of treatment, but completed the medication. She states her cough is better. She does have some residual  Pain right rib area. This has been an issue  since September 2025.   Recent imaging is most likely related to the COPD exacerbation vs acute bronchopneumonia that was treated.   She experiences daily coughing, which she describes as usual, but has never had her sputum cultured.   Her family history is significant for respiratory conditions. Her mother had a non-tuberculosis infection and underwent lung surgery, later dying with COPD as a heavy smoker. Two aunts also had COPD, one of whom was a non-smoker but possibly exposed to secondhand smoke. Her grandfather died with emphysema, and a cousin has similar respiratory issues.  She  has a history of smoking. She is currently on Trelegy for COPD and uses albuterol  as needed. She reports feeling better post-treatment but remains fatigued  As she has already been treated for her LR 0 scan, we will do a repeat scan in about 6 weeks.   We will start Mucinex daily, and flutter valve  to thin mucus and see if we can mobilize it to clear her airways. We will do Sputum Cx about 6 weeks after her last antibiotic to see if we can isolate a bacteria. With her family history, we did discuss referral to immunology for antibody evaluation. She said she would like to think about this, and let me know if she wants a referral.     Test Results: LDCT [facility] 11/15/2023 OVERALL LUNG-RADS CATEGORY: 0, BASED ON LESION: ID New and Increased bilateral clustered tree-in-bud opacities and. Increased in density of groundglass opacity in the left lower lobe increased consolidation in the right middle lobe. Overall findings are likely secondary to chronic infection such as NTM. MANAGEMENT RECOMMENDATION: Follow-up chest CT in 1-3 months, as per guideline given the new changes. However given the fluctuating nature of the pulmonary opacities and possible chronic infection longer follow-up can be obtained.  Low Dose CT Chest 05/29/2023 Lungs/Pleura: Bronchiectasis, scarring and volume loss within the right middle lobe is noted. This appeared stable to decreased in size in the interval. The mean derived diameter associated with the solid nodular component is equal to 17.2 mm on today's study versus 19.1  mm previously. Within the inferior lingula there is a focal area of bronchiectasis and nodular architectural distortion. The solid component associated with this area is decreased in size with a mean derived diameter of 7.0 mm versus 8.8 mm previously. Within the posterior left base there is a stable area of subpleural consolidation which measures 18.5 mm on today's study versus 20.5 mm previously. No new pulmonary  nodule or mass identified.   Lung-RADS 4Bs, suspicious. Additional imaging evaluation or consultation with Pulmonology or Thoracic Surgery recommended. 2. The S modifier above refers to the previously noted nodules identified within the right middle lobe, left base and lingula. Given the size of these nodules findings remain within the 4B category. However given the mild decrease in size of these nodules and lack of significant tracer uptake on PET-CT, further management should be predicated on the probability of malignancy based on patient evaluation, patient preference and risk of malignancy. 3. Aortic Atherosclerosis (ICD10-I70.0).   Narrative     Latest Ref Rng & Units 10/23/2022    9:04 AM 02/18/2022   11:30 AM 06/20/2020    8:35 AM  CBC  WBC 4.0 - 10.5 K/uL 6.8  8.5  6.4   Hemoglobin 12.0 - 15.0 g/dL 85.9  87.1  85.0   Hematocrit 36.0 - 46.0 % 42.4  37.5  44.3   Platelets 150.0 - 400.0 K/uL 381.0  333.0  324        Latest Ref Rng & Units 08/19/2023    4:37 PM 10/23/2022    9:04 AM 02/18/2022   11:30 AM  BMP  Glucose 70 - 99 mg/dL 98  897  84   BUN 6 - 23 mg/dL 13  10  9    Creatinine 0.40 - 1.20 mg/dL 9.24  9.23  9.34   Sodium 135 - 145 mEq/L 130  133  135   Potassium 3.5 - 5.1 mEq/L 4.0  4.3  4.8   Chloride 96 - 112 mEq/L 93  97  98   CO2 19 - 32 mEq/L 31  29  28    Calcium  8.4 - 10.5 mg/dL 9.5  9.4  9.1     BNP No results found for: BNP  ProBNP No results found for: PROBNP  PFT    Component Value Date/Time   FEV1PRE 1.24 07/31/2021 1601   FEV1POST 1.30 07/31/2021 1601   FVCPRE 2.06 07/31/2021 1601   FVCPOST 2.14 07/31/2021 1601   TLC 5.52 07/31/2021 1601   DLCOUNC 20.56 07/31/2021 1601   PREFEV1FVCRT 60 07/31/2021 1601   PSTFEV1FVCRT 61 07/31/2021 1601    No results found.   Past medical hx Past Medical History:  Diagnosis Date   Anxiety and depression 11/11/2011   Started counseling 12/2015, counselor recommended effexor  so I started it  01/02/16.   Aortic atherosclerosis    Coronary calcium  CT 11/2022   Chronic bronchitis (HCC)    Hearing loss, left 2019   Mild, conductive.  However, unclear etiology.  ENT considering dx of SSNHL or otosclerosis.  ENT rechecking 2 wks as of 08/03/17 office note.   Hypertension    Menometrorrhagia 11/11/2011   Multiple pulmonary nodules 11/20/2020   Lung ca screening CT-->LungRADS 4B, suspicious-> referral to Dr. Brenna.  PET 12/12/20 showed low uptake in the nodule. Slight imp on CT 05/2021, plan per pulm is rpt CT 6 mo.   Overweight(278.02) 11/11/2011   Lipids excellent 10/2012   Palpitations 05/2020   Perimenopausal disorder 2013   Sigmoid diverticulosis    on  colonoscopy 02/2017   Tobacco dependence in remission    quit 2022     Social History   Tobacco Use   Smoking status: Every Day    Current packs/day: 0.75    Average packs/day: 0.7 packs/day for 46.1 years (34.6 ttl pk-yrs)    Types: Cigarettes    Start date: 10/1977   Smokeless tobacco: Never   Tobacco comments:    1-2 a day 12/01/2023 KRD  Vaping Use   Vaping status: Never Used  Substance Use Topics   Alcohol use: Yes    Comment: 6-12 beers weekly   Drug use: No    Ms.Milanese reports that she has been smoking cigarettes. She started smoking about 46 years ago. She has a 34.6 pack-year smoking history. She has never used smokeless tobacco. She reports current alcohol use. She reports that she does not use drugs.  Tobacco Cessation: Ready to quit: Not Answered Counseling given: Not Answered Tobacco comments: 1-2 a day 12/01/2023 KRD Smoking 1-2 cigarettes daily   Past surgical hx, Family hx, Social hx all reviewed.  Current Outpatient Medications on File Prior to Visit  Medication Sig   albuterol  (VENTOLIN  HFA) 108 (90 Base) MCG/ACT inhaler Inhale 1-2 puffs into the lungs every 4 (four) hours as needed for wheezing or shortness of breath.   aspirin  EC 81 MG tablet Take 1 tablet (81 mg total) by mouth daily.  Swallow whole.   Fluticasone -Umeclidin-Vilant (TRELEGY ELLIPTA ) 100-62.5-25 MCG/ACT AEPB Inhale 1 puff into the lungs daily.   hydrochlorothiazide  (HYDRODIURIL ) 25 MG tablet Take 1 tablet (25 mg total) by mouth daily.   lisinopril  (ZESTRIL ) 40 MG tablet Take 1 tablet (40 mg total) by mouth daily.   metoprolol  tartrate (LOPRESSOR ) 50 MG tablet Take 1 tablet (50 mg total) by mouth 2 (two) times daily.   varenicline  (CHANTIX ) 1 MG tablet Take 1 mg by mouth 2 (two) times daily.   venlafaxine  XR (EFFEXOR -XR) 75 MG 24 hr capsule Take 1 capsule (75 mg total) by mouth daily with breakfast.   No current facility-administered medications on file prior to visit.     No Known Allergies  Review Of Systems:  Constitutional:   No  weight loss, night sweats,  Fevers, chills, fatigue, or  lassitude.  HEENT:   No headaches,  Difficulty swallowing,  Tooth/dental problems, or  Sore throat,                No sneezing, itching, ear ache, nasal congestion, post nasal drip,   CV:  No chest pain,  Orthopnea, PND, swelling in lower extremities, anasarca, dizziness, palpitations, syncope.   GI  No heartburn, indigestion, abdominal pain, nausea, vomiting, diarrhea, change in bowel habits, loss of appetite, bloody stools.   Resp: No shortness of breath with exertion or at rest.  No excess mucus, no productive cough,  No non-productive cough,  No coughing up of blood.  No change in color of mucus.  No wheezing.  No chest wall deformity  Skin: no rash or lesions.  GU: no dysuria, change in color of urine, no urgency or frequency.  No flank pain, no hematuria   MS:  No joint pain or swelling.  No decreased range of motion.  No back pain.+ right sided pain 2/2 cough  Psych:  No change in mood or affect. No depression or anxiety.  No memory loss.   Vital Signs BP (!) 110/56   Pulse 71   Temp 98 F (36.7 C) (Oral)   Ht 5' 3.5 (  1.613 m)   Wt 213 lb 6.4 oz (96.8 kg)   LMP 04/20/2013   SpO2 98%   BMI 37.21  kg/m    Physical Exam:  Physical Exam GENERAL: No distress, alert and oriented times 3. EARS NOSE THROAT: No sinus tenderness, tympanic membranes clear, pale nasal mucosa, no oral exudate, no post nasal drip, no lymphadenopathy. CHEST: Diminished breath sounds at left lung base. No wheeze, rales, dullness, no accessory muscle use, no nasal flaring, no sternal retractions. CARDIAC: S1, S2, regular rate and rhythm, no murmur. ABDOMINAL: Soft, non tender. ND, BS present, EXTREMITIES: No clubbing, cyanosis, edema. No obvious deformities NEUROLOGICAL: Normal strength. Alert and oriented x 3, MAE x 4 SKIN: No rashes, warm and dry. No obvious skin lesions PSYCHIATRIC: Normal mood and behavior.   Assessment/Plan  Assessment and Plan Assessment & Plan Bronchiectasis and chronic obstructive pulmonary disease (COPD) with chronic productive cough Chronic productive cough with CT findings suggestive of chronic infection.  Plan - Ordered chest x-ray to evaluate for infection. - Start Mucinex 1200 mg daily with water to thin secretions. - Start flutter valve four times daily, four to six blows at a time, to clear secretions. - Order sputum cultures six weeks after completion of last antibiotics  - Continue Trelegy one puff once daily. - Use albuterol  as needed for shortness of breath or wheezing. - Consider referral to immunologist for evaluation of potential immune deficiency.   Right rib pain likely secondary to cough Plan - Continue to monitor rib pain and manage conservatively. - Cough suppression with OTC meds  I spent 30 minutes dedicated to the care of this patient on the date of this encounter to include pre-visit review of records, face-to-face time with the patient discussing conditions above, post visit ordering of testing, clinical documentation with the electronic health record, making appropriate referrals as documented, and communicating necessary information to the patient's  healthcare team.        Lauraine JULIANNA Lites, NP 12/01/2023  9:11 AM

## 2023-12-01 NOTE — Telephone Encounter (Signed)
 CXR shows a Subtle patchy opacification over the right middle lobe which may be due to atelectasis versus early infection. Out of caution will treat with Doxycycline 100 mg BID x 7 days.  I will send in the prescription.  Can you please call her and let her know. She will need to delay getting cultures. Lets do a 4 week follow up to make sure she is ok.  Thanks so much

## 2023-12-22 NOTE — Telephone Encounter (Signed)
 Please advise opening tomorrow to come in if warranted

## 2023-12-23 ENCOUNTER — Encounter: Payer: Self-pay | Admitting: Acute Care

## 2023-12-23 ENCOUNTER — Other Ambulatory Visit: Payer: Self-pay

## 2023-12-23 ENCOUNTER — Ambulatory Visit: Admitting: Acute Care

## 2023-12-23 ENCOUNTER — Telehealth: Payer: Self-pay

## 2023-12-23 DIAGNOSIS — F17210 Nicotine dependence, cigarettes, uncomplicated: Secondary | ICD-10-CM

## 2023-12-23 DIAGNOSIS — R9389 Abnormal findings on diagnostic imaging of other specified body structures: Secondary | ICD-10-CM

## 2023-12-23 DIAGNOSIS — R911 Solitary pulmonary nodule: Secondary | ICD-10-CM

## 2023-12-23 DIAGNOSIS — Z87891 Personal history of nicotine dependence: Secondary | ICD-10-CM

## 2023-12-23 DIAGNOSIS — J479 Bronchiectasis, uncomplicated: Secondary | ICD-10-CM

## 2023-12-23 DIAGNOSIS — Z122 Encounter for screening for malignant neoplasm of respiratory organs: Secondary | ICD-10-CM

## 2023-12-23 DIAGNOSIS — F17200 Nicotine dependence, unspecified, uncomplicated: Secondary | ICD-10-CM

## 2023-12-23 NOTE — Telephone Encounter (Signed)
 Holly,  Can you check approval on CT for patient and send order to [facility] again? See notes 10/30/2023. Pt completed her scan in November, however it was read as incomplete and she needs to repeat scan in one month.   Isaiah

## 2023-12-23 NOTE — Progress Notes (Signed)
 Virtual Visit via Video Note  I connected with Kristin Day on 12/23/23 at  9:00 AM EST by a video enabled telemedicine application and verified that I am speaking with the correct person using two identifiers.  Location: Patient:  At Work  Provider:  [address] [place], [place], [address]    I discussed the limitations of evaluation and management by telemedicine and the availability of in person appointments. The patient expressed understanding and agreed to proceed.  History of Present Illness: Kristin Day is a 58 y.o. female current every day smoker with initially referred for abnormal lung cancer screening. Here for follow up on a LR 0 scan.   Pt. Was seen 12/01/2023. She was treated at that time for an early infiltrate versus atelectasis in her right middle lobe with Doxycycline  100 mg BID x 7 days. She completed this 12/08/2023. She called the office 12/22/2023 with new symptoms  of runny, stuffy nose that progressed to a cough. This morning when we connected  initially by video, but she was unable to navigate the sound, so the visit was completed on the telephone. She states she feels better today. She does have a cough but does not feel she needs medication. Secretions are clear.   Plan will be for sputum cultures after 01/21/2024( 6 weeks after last antibiotics) and LDCT follow up February of 2026. She is in agreement with this plan.   Of note patient does not want to be seen by immunology at this point in time.  Additionally she is not using flutter valve for me mucus clearance as recommended in 12/01/2023 office visit.  She states she is using Mucinex to help thin secretions.    Observations/Objective: 12/01/2023 DG chest 2 view Subtle patchy opacification over the right middle lobe which may be due to atelectasis versus early infection.   Assessment and Plan: Acute visit 12/23/2023, for concern of recurrent upper respiratory infection Patient states she is better  therefore no medication needed this visit Current everyday smoker Recent upper respiratory infection Chest x-ray with early infiltrate versus atelectasis in right middle lobe Treated with doxycycline  11/12 through 12/08/2023 Plan Plan will be for sputum cultures after January 21, 2024 which will be 6 weeks after last antibiotics taken. Bring  to the office if you are able to produce sputum so we can process them. These cultures can take up to 1 month to process. Low-dose CT screening scan is due February 2026 as follow-up to your previous imaging. Continue using flutter valve as previously discussed to help mobilize secretions. Continue Mucinex 1200 mg daily with a full glass of water to help thin secretions At your request we will hold off on referral to immunology at present time. Please work on quitting smoking You can receive free nicotine replacement therapy (patches, gum, or mints) by calling 1-800-QUIT NOW. Please call so we can get you on the path to becoming a non-smoker. I know it is hard, but you can do this!  Hypnosis for smoking cessation  [employer] [phone]  Acupuncture for smoking cessation  [employer] [phone]      Follow Up Instructions: Follow up LDCT in 4 weeks if better Follow up OV 02/2024>> Cultures Jan 2026.    I discussed the assessment and treatment plan with the patient. The patient was provided an opportunity to ask questions and all were answered. The patient agreed with the plan and demonstrated an understanding of the instructions.   The  patient was advised to call back or seek an in-person evaluation if the symptoms worsen or if the condition fails to improve as anticipated.  I spent 20 minutes dedicated to the care of this patient on the date of this encounter to include pre-visit review of records, video face-to-face time with the patient discussing conditions above, post visit ordering of testing, clinical  documentation with the electronic health record, making appropriate referrals as documented, and communicating necessary information to the patient's healthcare team.   Lauraine JULIANNA Lites, NP  12/23/2023

## 2023-12-23 NOTE — Telephone Encounter (Signed)
 This order has been sent & received by [facility].  They will contact the pt to schedule.   (Copy of fax confirmation)

## 2023-12-23 NOTE — Patient Instructions (Signed)
 It is good to see today. I am glad you are feeling better. No new medications needed today at this visit. Plan will be for sputum cultures after January 21, 2024 which will be 6 weeks after last antibiotics taken. Bring them to the office if you are able to blood cultures so we can process them. These cultures can take up to 1 month to process. Low-dose CT screening scan is due February 2026 as follow-up to your previous imaging. Continue using flutter valve as previously discussed to help mobilize secretions. Continue Mucinex 1200 mg daily with a full glass of water to help thin secretions At your request we will hold off on referral to immunology at present time. Please work on quitting smoking You can receive free nicotine replacement therapy (patches, gum, or mints) by calling 1-800-QUIT NOW. Please call so we can get you on the path to becoming a non-smoker. I know it is hard, but you can do this!  Hypnosis for smoking cessation  [employer] [phone]  Acupuncture for smoking cessation  [employer] [phone]

## 2023-12-30 ENCOUNTER — Ambulatory Visit: Admitting: Acute Care

## 2024-01-18 NOTE — Progress Notes (Unsigned)
 " Cardiology Office Note:  .   Date:  01/19/2024 ID:  Kristin Day, DOB Nov 03, 1965, MRN [id number] PCP: [name], MD [facility] Providers Cardiologist:  None   Patient Profile: .      PMH Coronary artery disease CT calcium  score 12/01/2022 CAC score 393 (98th percentile) LM 0, LAD 393, LCx 0, RCA 0 Coronary CTA 03/03/2023 Mild CAD 25-49% prox and mid LAD Severe TPV Aortic atherosclerosis Hypertension Former tobacco dependence Quit smoking 10/2020 Started back smoking July 2024 Quit again 2025 Pulmonary nodules COPD     Seen by me on 02/04/23 for new patient consult for elevated coronary artery calcium  score. She reports a history of SOB that led to lung cancer screening CTs on a regular basis and the identification of coronary calcification. She requested CT calcium  score for further risk stratification. She reports a history of a large pulmonary nodule that has now essentially disappeared. She sees pulmonology. She continues to have some SOB, especially during the summer. It has improved from a few years ago. Unfortunately, she quit smoking in October 2022 but started back July 2024.  Admits to 50 lb weight gain since she stopping smoking about two years ago. Her diet includes fast food for lunch and a balanced meal at home for dinner. Drinks diet soda regularly. She is not regularly active but is a little more active since her 32-year-old granddaughter has moved in. 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  She now works as a armed forces training and education officer. Most recent lipid panel 10/23/2022 with total cholesterol 150, triglycerides 75, LDL 64, HDL 71.3. Reports her cholesterol has always been well controlled.  Notes palpitations at times.  She has been taking Lopressor  100 mg in the mornings only, skipping her nighttime dose. She denies chest pain, orthopnea, PND, edema, presyncope, syncope. She admits to difficulty when she first lays down  at night due to excess phlegm but she only sleeps on one pillow and has not had to adjust her sleeping position.  Her mother died from complications of COPD, no history of heart disease to her awareness.  Her father has atrial fibrillation and is still living at age 66.  She is an only child.  Coronary CTA 03/03/23 revealed mild non-obstructive CAD.   Seen in follow-up by me on 07/16/23 with concerns about bilateral ankle swelling and weight gain over the past 4 weeks. Thinks it may be associated with increased beer consumption [place]. She noticed mild swelling prior to her trip, but swelling improved while at [facility], due to extensive walking. She works as a armed forces training and education officer at [employer] and is sedentary for approximately 8 hours daily. Takes hydrochlorothiazide  daily. We discussed findings from coronary CTA.  Her LDL cholesterol was 64 in October, managed through lifestyle measures without medication. Feeling better since we changed her from Lopressor  100 mg daily to 50 mg twice daily.  No formal exercise.  She denied chest pain, shortness of breath, palpitations, orthopnea, PND.  No presyncope or syncope. Notes wheezing and increased shortness of breath in humid conditions. Smoking cessation emphasized.     History of Present Illness: .    Discussed the use of AI scribe software for clinical note transcription with the patient, who gave verbal consent to proceed.  History of Present Illness Kristin Day is a very pleasant 58 year old female who is here today for follow-up  of  coronary artery disease.   She is concerned about ongoing weight gain and would like to discuss GLP-1 medications for weight management. Her fasting blood sugars are under 100 but trending upward, and she is exploring weight loss support through her employer's wellness program. She has COPD followed by pulmonology and recently improved after a respiratory illness treated  with antibiotics and steroids. She has exertional shortness of breath with brisk walking or carrying groceries. She quit smoking again and is tapering off Chantix . She had prior significant leg swelling that resolved after a steroid course and has not recurred.  She denies chest pain, orthopnea, PND, palpitations, presyncope, or syncope.  ROS: See HPI       Studies Reviewed: SABRA   EKG Interpretation Date/Time:  Tuesday January 19 2024 15:22:58 EST Ventricular Rate:  96 PR Interval:  134 QRS Duration:  80 QT Interval:  332 QTC Calculation: 419 R Axis:   76  Text Interpretation: Sinus rhythm with marked sinus arrhythmia When compared with ECG of 04-Feb-2023 10:54, Vent. rate has increased BY  36 BPM Confirmed by Percy Browning [phone]) on 01/19/2024 3:36:27 PM      Risk Assessment/Calculations:             Physical Exam:   VS: BP 106/66   Pulse 96   Ht 5' 3.5 (1.613 m)   Wt 217 lb (98.4 kg)   LMP 04/20/2013   SpO2 97%   BMI 37.84 kg/m   Wt Readings from Last 3 Encounters:  01/19/24 217 lb (98.4 kg)  12/01/23 213 lb 6.4 oz (96.8 kg)  10/23/23 213 lb (96.6 kg)     GEN: Obese, well developed in no acute distress NECK: No JVD; No carotid bruits CARDIAC: RRR, no murmurs, rubs, gallops RESPIRATORY:  Expiratory wheezing bilaterally; no rales, crackles or rhonchi  ABDOMEN: Soft, non-tender, non-distended EXTREMITIES:  No edema; No deformity     ASSESSMENT AND PLAN: .    Leg swelling Bilateral leg edema that has worsened over the past 3 weeks. She admits to dietary indiscretion earlier in the month to celebrate her birthday.  She is currently on hydrochlorothiazide  25 mg daily.  We discussed adding Lasix as needed for edema, however she would like to continue to manage conservatively.  Demonstrated appropriate leg elevation and encouraged low-sodium diet. Continue hydrochlorothiazide .   CAD Coronary CTA completed 03/03/23 due to calcification noted on prior CT which revealed  CAC score of 363 (98 percentile), mild nonobstructive CAD 25 to 49% in proximal and mid LAD, severe to PV.  We discussed these findings in detail and questions were answered to her satisfaction.  She is not having any symptoms concerning for angina and no further ischemia evaluation is warranted at this time. Continue secondary prevention with good BP and cholesterol control, stop smoking, good glucose control, healthy diet, weight loss, and regular physical activity.  Lipids have been well controlled, however I have recommended rosuvastatin  5 mg daily or at least 3 days a week for plaque stabilization.  She plans to start aspirin  81 mg daily. Lengthy discussion about lifestyle modification to prevent worsening ASCVD. Encouraged at least 150 minutes of moderate intensity exercise each week along with strengthening exercises such as squats and potential use of an app to track calories like My Fitness Pal. Continue metoprolol .  Hypertension BP is well controlled. She is feeling better since changing lopressor  dose to 50 mg BID. Continue lisinopril  and metoprolol .    COPD Tobacco abuse Expiratory wheezing noted on  exam.  She reports she notes this more in hot weather.  Encouraged smoking cessation. No increased work of breathing, no acute concerns. Management per PCP.  She quit in 2022 but unfortunately started back approximately 1 year ago. She would like to try Chantix . Prescription and goals of quitting reviewed. Complete cessation advised.     Assessment & Plan Obesity   Management includes considering GLP-1 receptor agonists for weight loss and diabetes control, as she can reduce cardiovascular risk and are more affordable through manufacturer programs. Discuss GLP-1 receptor agonists with [facility] for potential cost benefits. Consider using My Fitness Pal for activity and calorie tracking. Regular physical activity is encouraged to build endurance and lung capacity.  Atherosclerotic heart disease of  native coronary artery without angina pectoris   Coronary artery disease is present but not obstructing blood flow. Emphasis is on maintaining good blood pressure, cholesterol, and blood sugar control to manage cardiovascular risk. Cholesterol, apolipoprotein B, and lipoprotein A tests are ordered. Ensure fasting for at least six hours before the cholesterol test. Continue healthy lifestyle modifications including diet and exercise.  Essential hypertension   Blood pressure management is ongoing with regular monitoring. Continue the current blood pressure management regimen. Healthy lifestyle modifications including diet and exercise are encouraged.  Tobacco use   COPD She has successfully quit smoking and is tapering off Chantix .  I congratulated her on this achievement.  - Continue tapering off Chantix   - Maintain complete cessation from tobacco products             Disposition: ***  Signed, Rosaline Bane, NP-C "

## 2024-01-19 ENCOUNTER — Encounter (HOSPITAL_BASED_OUTPATIENT_CLINIC_OR_DEPARTMENT_OTHER): Payer: Self-pay | Admitting: Nurse Practitioner

## 2024-01-19 ENCOUNTER — Ambulatory Visit (INDEPENDENT_AMBULATORY_CARE_PROVIDER_SITE_OTHER): Admitting: Nurse Practitioner

## 2024-01-19 VITALS — BP 106/66 | HR 96 | Ht 63.5 in | Wt 217.0 lb

## 2024-01-19 DIAGNOSIS — Z6837 Body mass index (BMI) 37.0-37.9, adult: Secondary | ICD-10-CM

## 2024-01-19 DIAGNOSIS — Z7189 Other specified counseling: Secondary | ICD-10-CM

## 2024-01-19 DIAGNOSIS — R931 Abnormal findings on diagnostic imaging of heart and coronary circulation: Secondary | ICD-10-CM

## 2024-01-19 DIAGNOSIS — E66812 Obesity, class 2: Secondary | ICD-10-CM

## 2024-01-19 DIAGNOSIS — E785 Hyperlipidemia, unspecified: Secondary | ICD-10-CM | POA: Diagnosis not present

## 2024-01-19 DIAGNOSIS — J449 Chronic obstructive pulmonary disease, unspecified: Secondary | ICD-10-CM | POA: Diagnosis not present

## 2024-01-19 DIAGNOSIS — I10 Essential (primary) hypertension: Secondary | ICD-10-CM

## 2024-01-19 DIAGNOSIS — I2510 Atherosclerotic heart disease of native coronary artery without angina pectoris: Secondary | ICD-10-CM | POA: Diagnosis not present

## 2024-01-19 DIAGNOSIS — Z720 Tobacco use: Secondary | ICD-10-CM | POA: Diagnosis not present

## 2024-01-19 NOTE — Patient Instructions (Signed)
 Medication Instructions:   Your physician recommends that you continue on your current medications as directed. Please refer to the Current Medication list given to you today.   *If you need a refill on your cardiac medications before your next appointment, please call your pharmacy*  Lab Work:  Your physician recommends that you return for a FASTING Lipid/cmet/lpa/apolipoprotein B.   If you have labs (blood work) drawn today and your tests are completely normal, you will receive your results only by: MyChart Message (if you have MyChart) OR A paper copy in the mail If you have any lab test that is abnormal or we need to change your treatment, we will call you to review the results.  Testing/Procedures:  None ordered.   Follow-Up: At [facility], you and your health needs are our priority.  As part of our continuing mission to provide you with exceptional heart care, our providers are all part of one team.  This team includes your primary Cardiologist (physician) and Advanced Practice Providers or APPs (Physician Assistants and Nurse Practitioners) who all work together to provide you with the care you need, when you need it.  Your next appointment:   1 year(s)  Provider:   Rosaline Bane, NP    We recommend signing up for the patient portal called MyChart.  Sign up information is provided on this After Visit Summary.  MyChart is used to connect with patients for Virtual Visits (Telemedicine).  Patients are able to view lab/test results, encounter notes, upcoming appointments, etc.  Non-urgent messages can be sent to your provider as well.   To learn more about what you can do with MyChart, go to [email or web address].   Other Instructions  Your physician wants you to follow-up in: 1 year.  You will receive a reminder letter in the mail two months in advance. If you don't receive a letter, please call our office to schedule the follow-up appointment.

## 2024-01-28 ENCOUNTER — Ambulatory Visit: Admitting: Acute Care

## 2024-02-01 ENCOUNTER — Telehealth: Payer: Self-pay

## 2024-02-01 NOTE — Telephone Encounter (Signed)
 Spoke with patient to confirm CT scheduled. Advised scan is completed with [facility] and they have not received the order.   [facility] can you reach out to [facility] and make sure they have the order and anything else they may need to get her scheduled.    Isaiah

## 2024-02-01 NOTE — Telephone Encounter (Signed)
 2nd confirmation of order getting received by [facility]:

## 2024-02-01 NOTE — Telephone Encounter (Signed)
 Pt unable to provide number for [facility] w/ [facility], but stated her last CT was in [place].  I will reach out to [facility] & see where the disconnect is as I have confirmation the fax was received on 12/23/23.

## 2024-02-02 ENCOUNTER — Other Ambulatory Visit: Payer: Self-pay | Admitting: Acute Care

## 2024-02-06 IMAGING — CT CT CHEST LCS NODULE FOLLOW-UP W/O CM
1 series · 10 of 10 positions shown, 13 images · non-contrast
Comparison: 11/19/2020

CLINICAL DATA: Follow-up lung nodule scratch set six-month lung
nodule follow-up.

EXAM:
CT CHEST WITHOUT CONTRAST FOR LUNG CANCER SCREENING NODULE FOLLOW-UP
TECHNIQUE: Multidetector CT imaging of the chest was performed following the
standard protocol without IV contrast.
RADIATION DOSE REDUCTION: This exam was performed according to the
departmental dose-optimization program which includes automated
exposure control, adjustment of the mA and/or kV according to
patient size and/or use of iterative reconstruction technique.

[ct lung segmentation data · axial · 0.78mm/px · z∈[-58,-58]mm · 10 of 297 frames shown]
[frame 1/297  mediastinal]
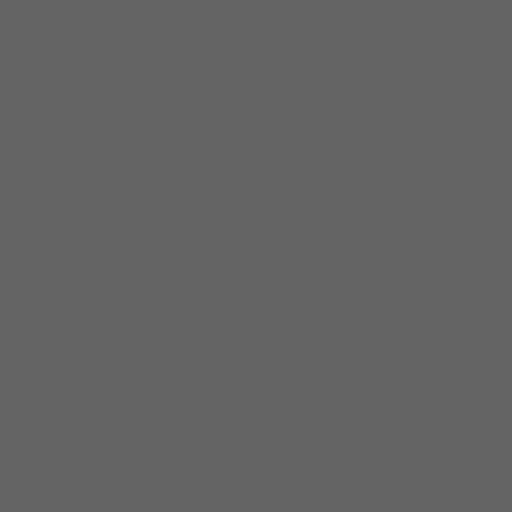
[frame 1/297  lung]
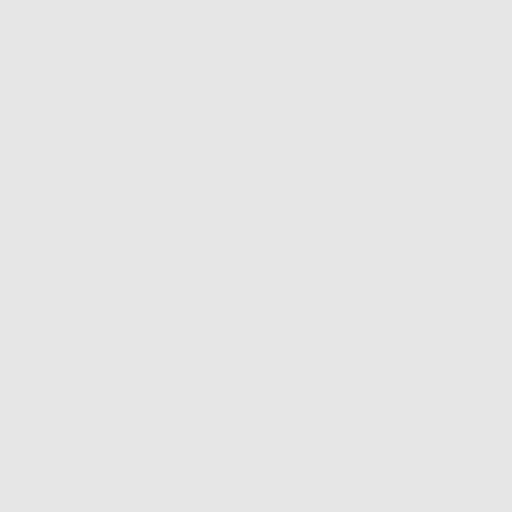
[frame 33/297  lung]
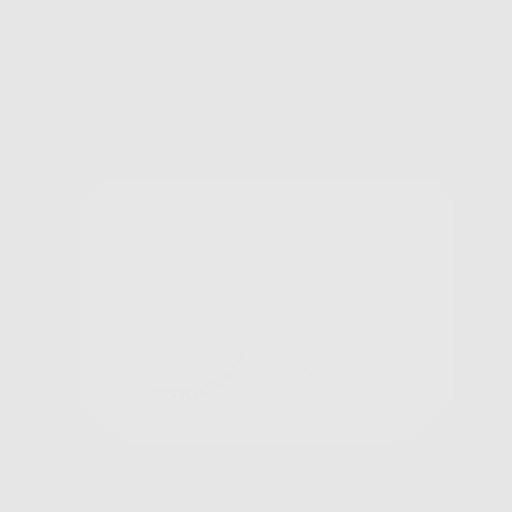
[frame 66/297  lung]
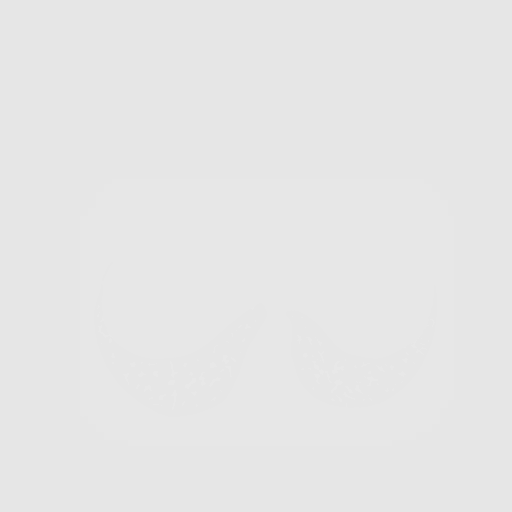
[frame 99/297  lung]
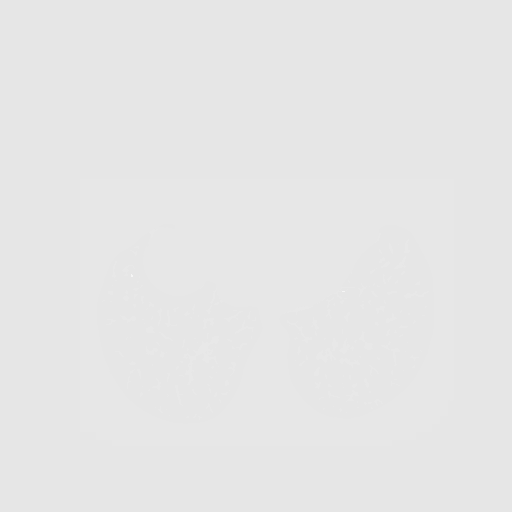
[frame 132/297  mediastinal]
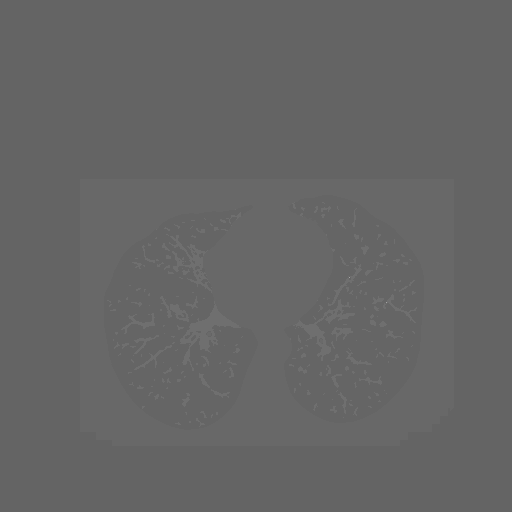
[frame 132/297  lung]
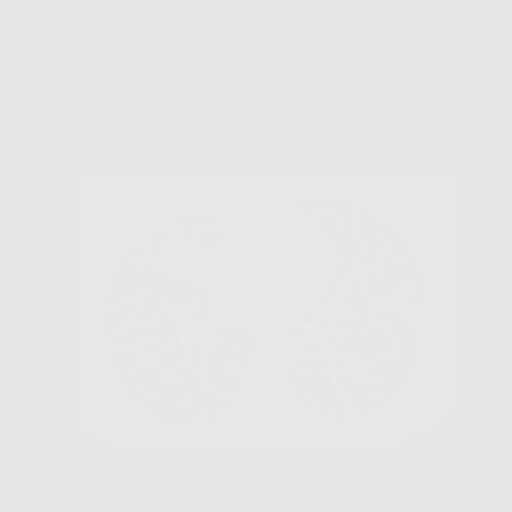
[frame 165/297  lung]
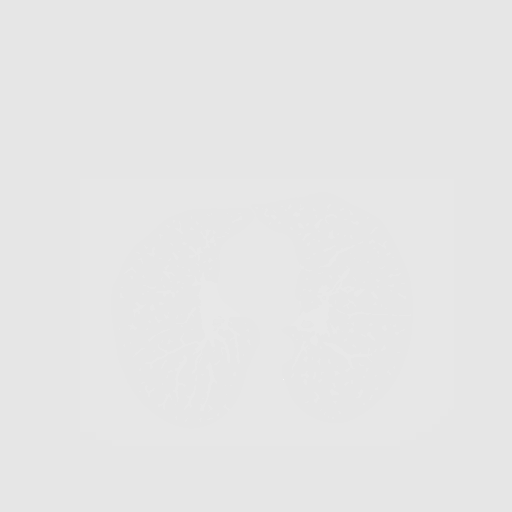
[frame 198/297  lung]
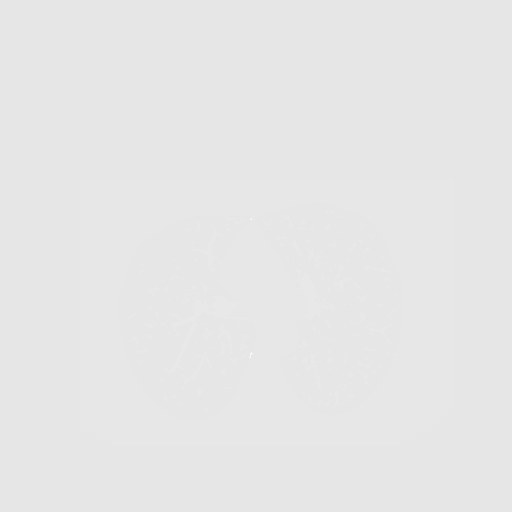
[frame 231/297  lung]
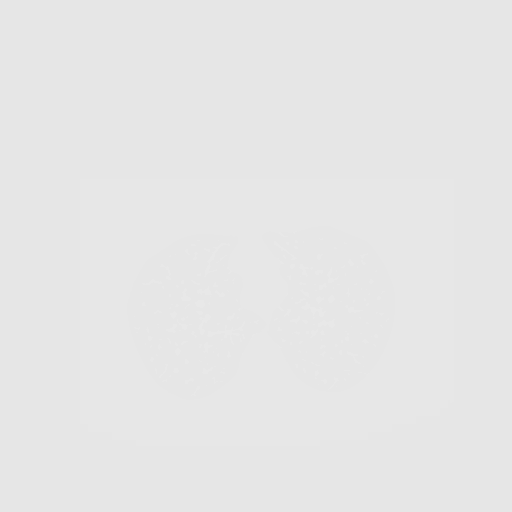
[frame 264/297  mediastinal]
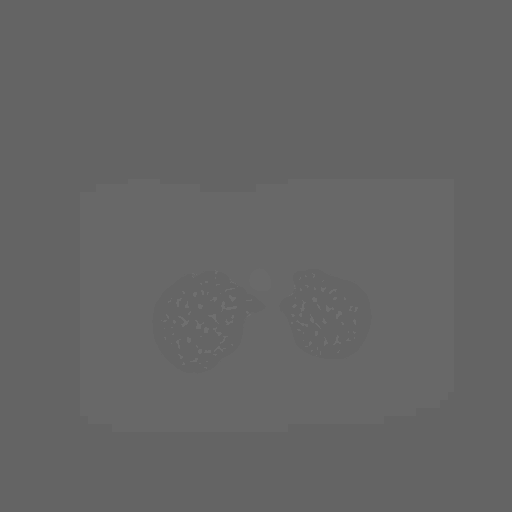
[frame 264/297  lung]
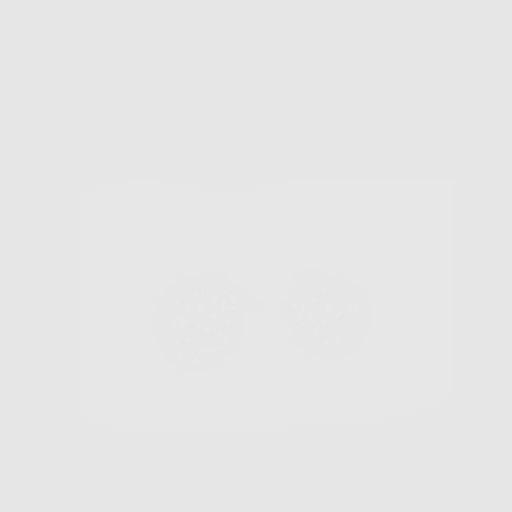
[frame 297/297  lung]
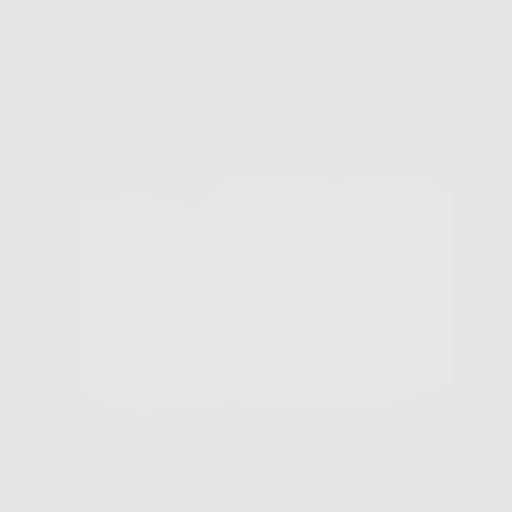

[10 of 10 positions shown; findings below may reference images not displayed]

FINDINGS: Cardiovascular: Heart size is within normal limits. Aortic
atherosclerosis and coronary artery calcifications. No pericardial
effusion.

Mediastinum/Nodes: No enlarged mediastinal, hilar, or axillary lymph
nodes. Thyroid gland, trachea, and esophagus demonstrate no
significant findings.

Lungs/Pleura: Bronchiectasis, scarring and volume loss within the
right middle lobe is noted. This appeared stable to decreased in
size in the interval. The mean derived diameter associated with the
solid nodular component is equal to 17.2 mm on today's study versus
19.1 mm previously. Within the inferior lingula there is a focal
area of bronchiectasis and nodular architectural distortion. The
solid component associated with this area is decreased in size with
a mean derived diameter of 7.0 mm versus 8.8 mm previously. Within
the posterior left base there is a stable area of subpleural
consolidation which measures 18.5 mm on today's study versus 20.5 mm
previously. No new pulmonary nodule or mass identified.

Mild centrilobular emphysema.

Upper Abdomen: No acute abnormality.

Musculoskeletal: No chest wall mass or suspicious bone lesions
identified.
IMPRESSION: 1. Lung-RADS 4Bs, suspicious. Additional imaging evaluation or
consultation with Pulmonology or Thoracic Surgery recommended.
2. The S modifier above refers to the previously noted nodules
identified within the right middle lobe, left base and lingula.
Given the size of these nodules findings remain within the 4B
category. However given the mild decrease in size of these nodules
and lack of significant tracer uptake on PET-CT, further management
should be predicated on the probability of malignancy based on
patient evaluation, patient preference and risk of malignancy.
3. Aortic Atherosclerosis ([id number]).

## 2024-02-09 ENCOUNTER — Telehealth: Payer: Self-pay

## 2024-02-09 NOTE — Telephone Encounter (Signed)
 LVM, advised order at [facility] had been received and confirmed by [facility]. Advised patient should call [facility] to schedule scan.
